# Patient Record
Sex: Male | Born: 1945
Health system: Southern US, Community
[De-identification: ages and names within clinical notes are randomized; demographics above are authoritative.]

## PROBLEM LIST (undated history)

## (undated) DIAGNOSIS — I5020 Unspecified systolic (congestive) heart failure: Secondary | ICD-10-CM

## (undated) DIAGNOSIS — Z720 Tobacco use: Secondary | ICD-10-CM

## (undated) DIAGNOSIS — I255 Ischemic cardiomyopathy: Secondary | ICD-10-CM

## (undated) DIAGNOSIS — I2510 Atherosclerotic heart disease of native coronary artery without angina pectoris: Secondary | ICD-10-CM

## (undated) HISTORY — DX: Atherosclerotic heart disease of native coronary artery without angina pectoris: I25.10

## (undated) HISTORY — PX: CORONARY ANGIOPLASTY WITH STENT PLACEMENT: SHX49

## (undated) HISTORY — DX: Tobacco use: Z72.0

## (undated) HISTORY — DX: Unspecified systolic (congestive) heart failure: I50.20

## (undated) HISTORY — DX: Ischemic cardiomyopathy: I25.5

---

## 2018-07-16 ENCOUNTER — Inpatient Hospital Stay
Admission: EM | Admit: 2018-07-16 | Discharge: 2018-07-17 | DRG: 270 | Disposition: A | Discharge status: Other Acute Inpatient Hospital | Payer: Medicare HMO | Attending: Internal Medicine | Admitting: Internal Medicine

## 2018-07-16 ENCOUNTER — Encounter
Admission: EM | Disposition: A | Discharge status: Other Acute Inpatient Hospital | Payer: Self-pay | Source: Home / Self Care | Attending: Internal Medicine

## 2018-07-16 ENCOUNTER — Encounter: Payer: Self-pay | Admitting: Emergency Medicine

## 2018-07-16 ENCOUNTER — Other Ambulatory Visit: Payer: Self-pay

## 2018-07-16 DIAGNOSIS — Z955 Presence of coronary angioplasty implant and graft: Secondary | ICD-10-CM

## 2018-07-16 DIAGNOSIS — I5021 Acute systolic (congestive) heart failure: Secondary | ICD-10-CM | POA: Diagnosis present

## 2018-07-16 DIAGNOSIS — I2102 ST elevation (STEMI) myocardial infarction involving left anterior descending coronary artery: Secondary | ICD-10-CM | POA: Diagnosis not present

## 2018-07-16 DIAGNOSIS — Z7982 Long term (current) use of aspirin: Secondary | ICD-10-CM

## 2018-07-16 DIAGNOSIS — I213 ST elevation (STEMI) myocardial infarction of unspecified site: Secondary | ICD-10-CM | POA: Diagnosis present

## 2018-07-16 DIAGNOSIS — Z720 Tobacco use: Secondary | ICD-10-CM | POA: Diagnosis not present

## 2018-07-16 DIAGNOSIS — I2510 Atherosclerotic heart disease of native coronary artery without angina pectoris: Secondary | ICD-10-CM

## 2018-07-16 DIAGNOSIS — I429 Cardiomyopathy, unspecified: Secondary | ICD-10-CM | POA: Diagnosis present

## 2018-07-16 DIAGNOSIS — I252 Old myocardial infarction: Secondary | ICD-10-CM

## 2018-07-16 HISTORY — PX: LEFT HEART CATH AND CORONARY ANGIOGRAPHY: CATH118249

## 2018-07-16 HISTORY — PX: IABP INSERTION: CATH118242

## 2018-07-16 HISTORY — PX: CORONARY/GRAFT ACUTE MI REVASCULARIZATION: CATH118305

## 2018-07-16 LAB — BASIC METABOLIC PANEL
Anion gap: 10 (ref 5–15)
BUN: 16 mg/dL (ref 8–23)
CO2: 26 mmol/L (ref 22–32)
Calcium: 8.8 mg/dL — ABNORMAL LOW (ref 8.9–10.3)
Chloride: 102 mmol/L (ref 98–111)
Creatinine, Ser: 1.35 mg/dL — ABNORMAL HIGH (ref 0.61–1.24)
GFR calc Af Amer: 60 mL/min — ABNORMAL LOW (ref >60)
GFR calc non Af Amer: 52 mL/min — ABNORMAL LOW (ref >60)
GLUCOSE: 109 mg/dL — AB (ref 70–99)
Potassium: 3.9 mmol/L (ref 3.5–5.1)
Sodium: 138 mmol/L (ref 135–145)

## 2018-07-16 LAB — CBC
HCT: 53.1 % — ABNORMAL HIGH (ref 39.0–52.0)
Hemoglobin: 17.6 g/dL — ABNORMAL HIGH (ref 13.0–17.0)
MCH: 30.3 pg (ref 26.0–34.0)
MCHC: 33.1 g/dL (ref 30.0–36.0)
MCV: 91.4 fL (ref 80.0–100.0)
Platelets: 247 10*3/uL (ref 150–400)
RBC: 5.81 MIL/uL (ref 4.22–5.81)
RDW: 14.4 % (ref 11.5–15.5)
WBC: 13.5 10*3/uL — ABNORMAL HIGH (ref 4.0–10.5)
nRBC: 0 % (ref 0.0–0.2)

## 2018-07-16 LAB — PROTIME-INR
INR: 0.9 (ref 0.8–1.2)
Prothrombin Time: 12.5 seconds (ref 11.4–15.2)

## 2018-07-16 LAB — APTT: aPTT: 35 seconds (ref 24–36)

## 2018-07-16 LAB — TROPONIN I: Troponin I: 3.08 ng/mL (ref <0.03)

## 2018-07-16 SURGERY — CORONARY/GRAFT ACUTE MI REVASCULARIZATION
Anesthesia: Moderate Sedation

## 2018-07-16 MED ORDER — FENTANYL CITRATE (PF) 100 MCG/2ML IJ SOLN
INTRAMUSCULAR | Status: AC
  Filled 2018-07-16: qty 2

## 2018-07-16 MED ORDER — NITROGLYCERIN 5 MG/ML IV SOLN
INTRAVENOUS | Status: AC
  Filled 2018-07-16: qty 10

## 2018-07-16 MED ORDER — MIDAZOLAM HCL 2 MG/2ML IJ SOLN
INTRAMUSCULAR | Status: DC | PRN
  Administered 2018-07-16 (×2): 0.5 mg via INTRAVENOUS

## 2018-07-16 MED ORDER — MIDAZOLAM HCL 2 MG/2ML IJ SOLN
INTRAMUSCULAR | Status: AC
  Filled 2018-07-16: qty 2

## 2018-07-16 MED ORDER — HEPARIN SODIUM (PORCINE) 1000 UNIT/ML IJ SOLN
INTRAMUSCULAR | Status: DC | PRN
  Administered 2018-07-16: 6000 [IU] via INTRAVENOUS

## 2018-07-16 MED ORDER — TICAGRELOR 90 MG PO TABS
ORAL_TABLET | ORAL | Status: AC
  Filled 2018-07-16: qty 2

## 2018-07-16 MED ORDER — FUROSEMIDE 10 MG/ML IJ SOLN
INTRAMUSCULAR | Status: DC | PRN
  Administered 2018-07-16 – 2018-07-17 (×2): 20 mg via INTRAVENOUS

## 2018-07-16 MED ORDER — FENTANYL CITRATE (PF) 100 MCG/2ML IJ SOLN
INTRAMUSCULAR | Status: DC | PRN
  Administered 2018-07-16: 12.5 ug via INTRAVENOUS

## 2018-07-16 MED ORDER — TIROFIBAN HCL IN NACL 5-0.9 MG/100ML-% IV SOLN
INTRAVENOUS | Status: DC | PRN
  Administered 2018-07-16: 0.15 ug/kg/min via INTRAVENOUS

## 2018-07-16 MED ORDER — HEPARIN SODIUM (PORCINE) 5000 UNIT/ML IJ SOLN
4000.0000 [IU] | Freq: Once | INTRAMUSCULAR | Status: AC
  Administered 2018-07-16: 4000 [IU] via INTRAVENOUS

## 2018-07-16 MED ORDER — HEPARIN SODIUM (PORCINE) 1000 UNIT/ML IJ SOLN
INTRAMUSCULAR | Status: AC
  Filled 2018-07-16: qty 1

## 2018-07-16 MED ORDER — NITROGLYCERIN 0.4 MG SL SUBL
0.4000 mg | SUBLINGUAL_TABLET | SUBLINGUAL | Status: AC
  Administered 2018-07-16: 0.4 mg via SUBLINGUAL

## 2018-07-16 MED ORDER — SODIUM CHLORIDE 0.9 % IV SOLN
INTRAVENOUS | Status: AC | PRN
  Administered 2018-07-16: 250 mL/h via INTRAVENOUS

## 2018-07-16 MED ORDER — ADENOSINE 6 MG/2ML IV SOLN
INTRAVENOUS | Status: AC
  Filled 2018-07-16: qty 2

## 2018-07-16 MED ORDER — TICAGRELOR 90 MG PO TABS
ORAL_TABLET | ORAL | Status: DC | PRN
  Administered 2018-07-16: 180 mg via ORAL

## 2018-07-16 MED ORDER — VERAPAMIL HCL 2.5 MG/ML IV SOLN
INTRAVENOUS | Status: DC | PRN
  Administered 2018-07-16: 2.5 mg via INTRA_ARTERIAL

## 2018-07-16 MED ORDER — ADENOSINE (DIAGNOSTIC) FOR INTRACORONARY USE
INTRAVENOUS | Status: DC | PRN
  Administered 2018-07-16: 30 ug via INTRACORONARY
  Administered 2018-07-16: 60 ug via INTRACORONARY
  Administered 2018-07-16: 30 ug via INTRACORONARY
  Administered 2018-07-16 (×2): 60 ug via INTRACORONARY

## 2018-07-16 MED ORDER — NITROGLYCERIN 1 MG/10 ML FOR IR/CATH LAB
INTRA_ARTERIAL | Status: DC | PRN
  Administered 2018-07-16: 100 ug via INTRACORONARY

## 2018-07-16 MED ORDER — TIROFIBAN (AGGRASTAT) BOLUS VIA INFUSION
INTRAVENOUS | Status: DC | PRN
  Administered 2018-07-16: 2040 ug via INTRAVENOUS

## 2018-07-16 MED ORDER — HEPARIN (PORCINE) 25000 UT/250ML-% IV SOLN
1000.0000 [IU]/h | INTRAVENOUS | Status: DC
  Filled 2018-07-16: qty 250

## 2018-07-16 MED ORDER — FUROSEMIDE 10 MG/ML IJ SOLN
INTRAMUSCULAR | Status: AC
  Filled 2018-07-16: qty 4

## 2018-07-16 MED ORDER — VERAPAMIL HCL 2.5 MG/ML IV SOLN
INTRAVENOUS | Status: AC
  Filled 2018-07-16: qty 2

## 2018-07-16 MED ORDER — HEPARIN (PORCINE) IN NACL 1000-0.9 UT/500ML-% IV SOLN
INTRAVENOUS | Status: DC | PRN
  Administered 2018-07-16: 1000 mL

## 2018-07-16 SURGICAL SUPPLY — 26 items
BALLN IABP SENSA PLUS 8F 50CC (BALLOONS) ×3
BALLN TREK RX 2.5X12 (BALLOONS) ×3
BALLN ~~LOC~~ EUPHORA RX 3.25X15 (BALLOONS) ×3
BALLOON IABP SENS PLUS 8F 50CC (BALLOONS) IMPLANT
BALLOON TREK RX 2.5X12 (BALLOONS) IMPLANT
BALLOON ~~LOC~~ EUPHORA RX 3.25X15 (BALLOONS) IMPLANT
CANNULA 5F STIFF (CANNULA) ×2 IMPLANT
CATH EAGLE EYE PLAT IMAGING (CATHETERS) ×2 IMPLANT
CATH INFINITI JR4 5F (CATHETERS) ×2 IMPLANT
CATH LAUNCHER 6FR EBU 3 (CATHETERS) ×2 IMPLANT
CATH VISTA GUIDE 6FR JR4 (CATHETERS) ×2 IMPLANT
DEVICE INFLAT 30 PLUS (MISCELLANEOUS) ×2 IMPLANT
DEVICE RAD TR BAND REGULAR (VASCULAR PRODUCTS) ×2 IMPLANT
GLIDESHEATH SLEND SS 6F .021 (SHEATH) ×2 IMPLANT
GUIDEWIRE EMER 3M J .025X150CM (WIRE) ×2 IMPLANT
KIT MANI 3VAL PERCEP (MISCELLANEOUS) ×3 IMPLANT
KIT TRANSPAC II SGL 4260605 (MISCELLANEOUS) ×2 IMPLANT
PACK CARDIAC CATH (CUSTOM PROCEDURE TRAY) ×3 IMPLANT
PROTECTION STATION PRESSURIZED (MISCELLANEOUS) ×3
SHEATH AVANTI 5FR X 11CM (SHEATH) ×2 IMPLANT
STATION PROTECTION PRESSURIZED (MISCELLANEOUS) IMPLANT
STENT SYNERGY DES 2.75X16 (Permanent Stent) ×2 IMPLANT
WIRE ASAHI PROWATER 180CM (WIRE) ×2 IMPLANT
WIRE GUIDERIGHT .035X150 (WIRE) ×2 IMPLANT
WIRE ROSEN-J .035X260CM (WIRE) ×2 IMPLANT
WIRE RUNTHROUGH .014X180CM (WIRE) ×2 IMPLANT

## 2018-07-16 NOTE — ED Triage Notes (Addendum)
Patient to ER for c/o chest pain today. Took Nitro x3 doses at home [phone] was first dose) with no relief. Patient has h/o MI and states this pain feels similar to previous episodes. Patient reports chest pain is midsternal. Denies any shortness of breath or nausea. Reports mild episode of diaphoresis prior to arrival, which patient states is "how he knows he is having a heart attack". Patient states last MI was 4 years ago, has had 4 stents placed total.  Took 3 full dose ASA's at home.

## 2018-07-16 NOTE — ED Notes (Signed)
Cardiologist at bedside with patient and patient's wife.

## 2018-07-16 NOTE — Progress Notes (Signed)
ANTICOAGULATION CONSULT NOTE - Initial Consult  Pharmacy Consult for heparin drip Indication: chest pain/ACS  No Known Allergies  Patient Measurements: Height: 5\' 11"  (180.3 cm) Weight: 180 lb (81.6 kg) IBW/kg (Calculated) : 75.3 Heparin Dosing Weight: 80 kg  Vital Signs: Temp: 99.1 F (37.3 C) (02/25 2203) Temp Source: Oral (02/25 2203) BP: 122/83 (02/25 2230) Pulse Rate: 94 (02/25 2230)  Labs: Recent Labs    07/16/18 2201  HGB 17.6*  HCT 53.1*  PLT 247  APTT 35  LABPROT 12.5  INR 0.9  CREATININE 1.35*  TROPONINI 3.08*    Estimated Creatinine Clearance: 51.9 mL/min (A) (by C-G formula based on SCr of 1.35 mg/dL (H)).   Medical History: Past Medical History:  Diagnosis Date  . MI (myocardial infarction) ([place])     Medications:  Scheduled:    Assessment: Patient arrived s/t to CP called a code STEMI, trops 3.08, EKG showing ST elevation in leads VI, II, & III. No record of PTA anticoagulation. Patient being started on heparin drip for STEMI--patient currently in cath, received heparin bolus, but heparin drip not yet started as of 2300.  Goal of Therapy:  Heparin level 0.3-0.7 units/ml Monitor platelets by anticoagulation protocol: Yes   Plan:  Patient was bolused w/ heparin 4000 units IV x 1 Ordered rate to start at 1000 units/hr Baseline labs ordered and WNL Will check anti-Xa @ 0800 unless heparin drip no longer needed post-cath Will monitor daily CBC's and adjust per anti-Xa levels   Tobie Lords, PharmD, BCPS Clinical Pharmacist 07/16/2018

## 2018-07-16 NOTE — ED Provider Notes (Signed)
Called for CODE STEMI activation at 956p.  Charge RN Larene Beach aware. Main side bed being assigned.  EKG reviewed (screened) at 954p, positive for STEMI changes.    Delman Kitten, MD 07/16/18 2157

## 2018-07-16 NOTE — Consult Note (Signed)
Cardiology Consultation:   Patient ID: Nicholas Schultz MRN: [id number]; DOB: 1945/05/30  Admit date: 07/16/2018 Date of Consult: 07/16/2018  Primary Care Provider: Patient, [name] Per Primary Cardiologist: New - Nicholas Schultz Primary Electrophysiologist:  None    Patient Profile:   Nicholas Schultz is a 73 y.o. male with a hx of CAD s/p "three heart attacks and 4 uncoated stents," who is being seen today for the evaluation of chest pain and abnormal EKG at the request of Dr. Jacqualine Code.  History of Present Illness:   Nicholas Schultz reports having 3 prior heart attacks but has not followed with a physician for many years.  He only takes aspirin and states that he will not take more than 3 months of dual antiplatelet therapy due to bruising while on aspirin and clopidogrel in the past.  He was in his usual state of health until approximately 7 PM, when he had acute onset of substernal chest pain without radiation.  He denies accompanying symptoms including shortness of breath, nausea, and diaphoresis.  He presented to the emergency department, where he was found to have anterior ST segment elevation.  Code STEMI was called.  Upon my arrival, pain was minimal.  He was transported to the cardiac cath lab, where he was found to have a thrombotic 99% stenosis in the proximal LAD just before a remotely placed stent.  Lesion was successfully treated with a single drug-eluting stent overlapping the old stent.  Patient was noted to have severely reduced LVEF and severely elevated LVEDP.  Therefore, intra-aortic balloon pump was placed in the setting of acute MI complicated by severe acute heart failure.  Past Medical History:  Diagnosis Date  . MI (myocardial infarction) [facility])     Past Surgical History:  Procedure Laterality Date  . CORONARY ANGIOPLASTY WITH STENT PLACEMENT       Home Medications:  Aspirin 81 mg daily  Inpatient Medications: Scheduled Meds:  Continuous Infusions: . heparin     PRN  Meds:   Allergies:   No Known Allergies  Social History:   Social History   Tobacco Use  . Smoking status: Current Every Day Smoker    Types: Cigars  . Smokeless tobacco: Never Used  Substance Use Topics  . Alcohol use: Yes    Comment: rare  . Drug use: Not on file    Family History:   Father has a history of multiple MIs.  ROS:  Review of Systems  Unable to perform ROS: Acuity of condition   Physical Exam/Data:   Vitals:   07/16/18 2203 07/16/18 2210 07/16/18 2220 07/16/18 2230  BP: 123/89   122/83  Pulse: 98 96 91 94  Resp: (!) 24 (!) 22 12 (!) 26  Temp: 99.1 F (37.3 C)     TempSrc: Oral     SpO2: 100% 95% 95% 96%  Weight:      Height:       No intake or output data in the 24 hours ending 07/16/18 2244 Last 3 Weights 07/16/2018  Weight (lbs) 180 lb  Weight (kg) 81.647 kg     Body mass index is 25.1 kg/m.  General: Elderly man, lying in bed.  He is in no acute distress. HEENT: normal Lymph: no adenopathy Neck: Unable to assess, as the patient is lying supine on the cath table. Endocrine:  No thryomegaly Vascular: No carotid bruits; 2+ radial, femoral, and pedal pulses bilaterally.  TR band overlies the right wrist.  Right femoral intra-aortic balloon pump is in  place without hematoma or bleeding. Cardiac: Regular rate and rhythm without murmurs, rubs, or gallops. Lungs: Coarse breath sounds anteriorly.  No wheezes or crackles. Abd: Soft, nontender, no hepatomegaly  Ext: No significant lower extremity edema. Musculoskeletal:  No deformities, BUE and BLE strength normal and equal Skin: warm and dry  Neuro:  CNs 2-12 intact, no focal abnormalities noted Psych:  Normal affect   EKG:  The EKG was personally reviewed and demonstrates: Normal sinus rhythm with anterior ST segment elevation.  Relevant CV Studies: LHC/PCI (07/16/2018): Multivessel CAD with moderate LCx and RCA disease.  Severe ostial RPDA stenosis is noted.  Culprit lesion is a thrombotic 99%  stenosis in the proximal LAD.  IVIS demonstrates grossly undersized stents throughout the proximal and mid LAD and diffuse plaquing of the LAD.  Successful PCI to the proximal LAD using a Synergy 2.75 x 16 mm drug-eluting stent (postdilated to 3.4 mm) with 0% residual stenosis and TIMI-3 flow.  LVEF severely reduced with anterior and apical akinesis.  LVEF approximately 25%.  LVEDP approximately 40 mmHg.  Laboratory Data:  ChemistryNo results for input(s): NA, K, CL, CO2, GLUCOSE, BUN, CREATININE, CALCIUM, GFRNONAA, GFRAA, ANIONGAP in the last 168 hours.  No results for input(s): PROT, ALBUMIN, AST, ALT, ALKPHOS, BILITOT in the last 168 hours. Hematology Recent Labs  Lab 07/16/18 2201  WBC 13.5*  RBC 5.81  HGB 17.6*  HCT 53.1*  MCV 91.4  MCH 30.3  MCHC 33.1  RDW 14.4  PLT 247   Cardiac EnzymesNo results for input(s): TROPONINI in the last 168 hours. No results for input(s): TROPIPOC in the last 168 hours.  BNPNo results for input(s): BNP, PROBNP in the last 168 hours.  DDimer No results for input(s): DDIMER in the last 168 hours.  Radiology/Studies:  No results found.  Assessment and Plan:   Anterior STEMI Presentation consistent with acute plaque rupture in the proximal LAD just before a remotely placed stent.  IVUS demonstrates extensive plaquing of the LAD with undersizing of prior stents.  Patient was successfully treated with a single drug-eluting stent in the proximal LAD overlapping previously placed stent.  He is currently chest pain-free.  Given severely reduced LVEF and elevated LVEDP, IABP was placed for hemodynamic support.  Transferred to [facility] for post STEMI care, including IABP management as appropriate nursing is not available at [facility] for maintenance of IABP.  DAPT with aspirin and ticagrelor for at least 12 months.  Aggressive secondary prevention including high intensity statin therapy.  Acute systolic heart failure LVEF severely reduced with significant  anterior wall motion abnormality and severely elevated LVEDP.  Given history of prior MIs and stents in the LAD, some degree of cardiomyopathy may be chronic.  We do not have records from outside hospital detailing prior cardiac care/interventions.  IABP care 1:1 and wean as tolerated.  Obtain portable chest radiograph when patient arrives at [facility].  Continue diuresis.  Consider adding beta-blocker and ACE inhibitor/ARB once decompensated heart failure improves and blood pressure/renal function tolerate.  Obtain transthoracic echocardiogram.  Tobacco use Smoking cessation encouraged.  Disposition Due to critical illness status post anterior STEMI with severe acute systolic heart failure status post intra-aortic balloon pump, we will transfer the patient to [facility] for ongoing management.  Nursing staff is not available at [facility] to manage IABP.  I have discussed risks and benefits of transport with the patient and his wife; they are in agreement with this.  I have also discussed the patient's care with  Dr. Burt Knack, who is the interventional attending at [facility].  For questions or updates, please contact [place] Please consult [email or web address] for contact info under [facility] Cardiology.  Signed, Nelva Bush, MD  07/16/2018 10:44 PM

## 2018-07-16 NOTE — ED Provider Notes (Signed)
[facility] Emergency Department Provider Note   ____________________________________________   First MD Initiated Contact with Patient 07/16/18 2204     (approximate)  I have reviewed the triage vital signs and the nursing notes.   HISTORY  Chief Complaint Chest Pain  EM caveat: Limited due to acute cardiovascular concerns and life-threatening cardiovascular illness suspected  HPI Nicholas Schultz is a 73 y.o. male here for evaluation of chest pain   Rather abrupt heavy pressure in the chest since 730 this evening.  Started suddenly.  Took 3 325 mg aspirin tablets, 3 nitroglycerin tablets and reports the pain is eased down to about moderate.  No nausea or vomiting.  No chills.  No recent illness.  No chest pain that is sharp.  No ripping tearing or moving pain.  Reports same in the past with 3 previous heart attacks and 4 stents  Past Medical History:  Diagnosis Date  . MI (myocardial infarction) [facility])     Patient Active Problem List   Diagnosis Date Noted  . STEMI involving left anterior descending coronary artery ([place]) 07/17/2018  . Acute ST elevation myocardial infarction (STEMI) ([place]) 07/17/2018    Past Surgical History:  Procedure Laterality Date  . CORONARY ANGIOPLASTY WITH STENT PLACEMENT    . CORONARY/GRAFT ACUTE MI REVASCULARIZATION N/A 07/16/2018   Procedure: Coronary/Graft Acute MI Revascularization;  Surgeon: Nelva Bush, MD;  Location: [place] CV LAB;  Service: Cardiovascular;  Laterality: N/A;  . IABP INSERTION N/A 07/16/2018   Procedure: IABP Insertion;  Surgeon: Nelva Bush, MD;  Location: [place] CV LAB;  Service: Cardiovascular;  Laterality: N/A;  . LEFT HEART CATH AND CORONARY ANGIOGRAPHY N/A 07/16/2018   Procedure: LEFT HEART CATH AND CORONARY ANGIOGRAPHY;  Surgeon: Nelva Bush, MD;  Location: [place] CV LAB;  Service: Cardiovascular;  Laterality: N/A;    Prior to Admission medications   Medication  Sig Start Date End Date Taking? Authorizing Provider  aspirin EC 81 MG tablet Take 81 mg by mouth daily.    [provider]  flavoxATE (URISPAS) 100 MG tablet Take 100 mg by mouth as needed (urinary frequency).    [provider]    Allergies Patient has no known allergies.  No family history on file.  Social History Social History   Tobacco Use  . Smoking status: Current Every Day Smoker    Types: Cigars  . Smokeless tobacco: Never Used  Substance Use Topics  . Alcohol use: Yes    Comment: rare  . Drug use: Not on file    Review of Systems EM caveat No numbness or tingling.  No pain in the back.  Denies any problems with easy bleeding.    ____________________________________________   PHYSICAL EXAM:  VITAL SIGNS: ED Triage Vitals  Enc Vitals Group     BP 07/16/18 2203 123/89     Pulse Rate 07/16/18 2203 98     Resp 07/16/18 2203 (!) 24     Temp 07/16/18 2203 99.1 F (37.3 C)     Temp Source 07/16/18 2203 Oral     SpO2 07/16/18 2203 100 %     Weight 07/16/18 2153 180 lb (81.6 kg)     Height 07/16/18 2153 5\' 11"  (1.803 m)     Head Circumference --      Peak Flow --      Pain Score 07/16/18 2153 2     Pain Loc --      Pain Edu? --  Excl. in [place]? --     Constitutional: Alert and oriented.  Ill-appearing slightly pale. Eyes: Conjunctivae are normal. Head: Atraumatic. Nose: No congestion/rhinnorhea. Mouth/Throat: Mucous membranes are moist. Neck: No stridor.  Cardiovascular: Normal rate, regular rhythm. Grossly normal heart sounds.  Good peripheral circulation. Respiratory: Normal respiratory effort.  No retractions. Lungs CTAB. Gastrointestinal: Soft and nontender. No distention. Musculoskeletal: No lower extremity tenderness nor edema. Neurologic:  Normal speech and language. No gross focal neurologic deficits are appreciated.  Skin:  Skin is warm, dry and intact. No rash noted. Psychiatric: Mood and affect are slightly anxious.  Speech and behavior are normal.  ____________________________________________   LABS (all labs ordered are listed, but only abnormal results are displayed)  Labs Reviewed  CBC - Abnormal; Notable for the following components:      Result Value   WBC 13.5 (*)    Hemoglobin 17.6 (*)    HCT 53.1 (*)    All other components within normal limits  BASIC METABOLIC PANEL - Abnormal; Notable for the following components:   Glucose, Bld 109 (*)    Creatinine, Ser 1.35 (*)    Calcium 8.8 (*)    GFR calc non Af Amer 52 (*)    GFR calc Af Amer 60 (*)    All other components within normal limits  TROPONIN I - Abnormal; Notable for the following components:   Troponin I 3.08 (*)    All other components within normal limits  PROTIME-INR  APTT  POCT ACTIVATED CLOTTING TIME  POCT ACTIVATED CLOTTING TIME  POCT ACTIVATED CLOTTING TIME   ____________________________________________  EKG  Reviewed and entered by me at 2155 Heart rate 100 QRS 110 QTc 440 Sinus tachycardia.  Acute ST elevation MI and noted in V2 and V3.  Code STEMI initiated ____________________________________________  RADIOLOGY  The patient denies any sharp pain in the chest.  Reports a heavy pressure.  There is no moving pain ripping or tearing component. ____________________________________________   PROCEDURES  Procedure(s) performed: None  Procedures  Critical Care performed: Yes, see critical care note(s)  CRITICAL CARE Performed by: Delman Kitten   Total critical care time: 35 minutes  Critical care time was exclusive of separately billable procedures and treating other patients.  Critical care was necessary to treat or prevent imminent or life-threatening deterioration.  Critical care was time spent personally by me on the following activities: development of treatment plan with patient and/or surrogate as well as nursing, discussions with consultants, evaluation of patient's response to treatment,  examination of patient, obtaining history from patient or surrogate, ordering and performing treatments and interventions, ordering and review of laboratory studies, ordering and review of radiographic studies, pulse oximetry and re-evaluation of patient's condition.  ____________________________________________   INITIAL IMPRESSION / ASSESSMENT AND PLAN / ED COURSE  Pertinent labs & imaging results that were available during my care of the patient were reviewed by me and considered in my medical decision making (see chart for details).   Appears initially significant for concern for ST elevation MI.  Relieved somewhat at home by multiple aspirin tablets and nitro.  Blood pressure is normotensive will give additional nitroglycerin.  Code STEMI has been initiated.  Heparin given.  Patient is resting reports his pain is almost minimal after nitro.  No ripping tearing or moving pain.  No back pain.  History of ACS in the past.  Clinical Course as of Jul 19 1111  Tue Jul 16, 2018  2207 Dr. Saunders Revel en route for STEMI. Called.  Advises 4000 units heparin bolus. Pain currently "almost gone" and is getting better. Normal vital at present. Await cath lab arrival. He took 3x 325mg  asa and 3 nitro tabs since 730p   [MQ]    Clinical Course User Index [MQ] Delman Kitten, MD   Patient taken to the Cath Lab.  Patient went with Dr. end of cardiology Patient given heparin bolus as advised by cardiology.  Has already taken aspirin at home.  After 1 additional nitro here pain was almost gone. ____________________________________________   FINAL CLINICAL IMPRESSION(S) / ED DIAGNOSES  Final diagnoses:  ST elevation myocardial infarction (STEMI), unspecified artery ([place])        Note:  This document was prepared using Dragon voice recognition software and may include unintentional dictation errors       Delman Kitten, MD 07/18/18 1113

## 2018-07-17 ENCOUNTER — Inpatient Hospital Stay (HOSPITAL_COMMUNITY)
Admission: EM | Admit: 2018-07-17 | Discharge: 2018-07-19 | DRG: 270 | Disposition: A | Discharge status: Home / Self Care | Payer: Medicare HMO | Source: Other Acute Inpatient Hospital | Attending: Cardiovascular Disease | Admitting: Cardiovascular Disease

## 2018-07-17 ENCOUNTER — Encounter: Payer: Self-pay | Admitting: Internal Medicine

## 2018-07-17 ENCOUNTER — Inpatient Hospital Stay (HOSPITAL_COMMUNITY): Payer: Medicare HMO

## 2018-07-17 DIAGNOSIS — Z720 Tobacco use: Secondary | ICD-10-CM | POA: Diagnosis not present

## 2018-07-17 DIAGNOSIS — I2102 ST elevation (STEMI) myocardial infarction involving left anterior descending coronary artery: Principal | ICD-10-CM | POA: Diagnosis present

## 2018-07-17 DIAGNOSIS — I252 Old myocardial infarction: Secondary | ICD-10-CM | POA: Diagnosis not present

## 2018-07-17 DIAGNOSIS — Z7982 Long term (current) use of aspirin: Secondary | ICD-10-CM | POA: Diagnosis not present

## 2018-07-17 DIAGNOSIS — F17290 Nicotine dependence, other tobacco product, uncomplicated: Secondary | ICD-10-CM | POA: Diagnosis present

## 2018-07-17 DIAGNOSIS — I213 ST elevation (STEMI) myocardial infarction of unspecified site: Secondary | ICD-10-CM | POA: Diagnosis present

## 2018-07-17 DIAGNOSIS — Z8249 Family history of ischemic heart disease and other diseases of the circulatory system: Secondary | ICD-10-CM | POA: Diagnosis not present

## 2018-07-17 DIAGNOSIS — E785 Hyperlipidemia, unspecified: Secondary | ICD-10-CM | POA: Diagnosis present

## 2018-07-17 DIAGNOSIS — I2109 ST elevation (STEMI) myocardial infarction involving other coronary artery of anterior wall: Secondary | ICD-10-CM

## 2018-07-17 DIAGNOSIS — I255 Ischemic cardiomyopathy: Secondary | ICD-10-CM | POA: Diagnosis present

## 2018-07-17 DIAGNOSIS — I2510 Atherosclerotic heart disease of native coronary artery without angina pectoris: Secondary | ICD-10-CM | POA: Diagnosis present

## 2018-07-17 DIAGNOSIS — Z79899 Other long term (current) drug therapy: Secondary | ICD-10-CM | POA: Diagnosis not present

## 2018-07-17 DIAGNOSIS — D45 Polycythemia vera: Secondary | ICD-10-CM | POA: Diagnosis present

## 2018-07-17 DIAGNOSIS — Z955 Presence of coronary angioplasty implant and graft: Secondary | ICD-10-CM | POA: Diagnosis not present

## 2018-07-17 DIAGNOSIS — Z9861 Coronary angioplasty status: Secondary | ICD-10-CM

## 2018-07-17 DIAGNOSIS — I5021 Acute systolic (congestive) heart failure: Secondary | ICD-10-CM | POA: Diagnosis present

## 2018-07-17 DIAGNOSIS — E782 Mixed hyperlipidemia: Secondary | ICD-10-CM | POA: Diagnosis not present

## 2018-07-17 DIAGNOSIS — I429 Cardiomyopathy, unspecified: Secondary | ICD-10-CM | POA: Diagnosis present

## 2018-07-17 LAB — HEMOGLOBIN A1C
Hgb A1c MFr Bld: 5.7 % — ABNORMAL HIGH (ref 4.8–5.6)
Mean Plasma Glucose: 116.89 mg/dL

## 2018-07-17 LAB — POCT ACTIVATED CLOTTING TIME
Activated Clotting Time: 246 seconds
Activated Clotting Time: 274 seconds
Activated Clotting Time: 279 seconds

## 2018-07-17 LAB — BASIC METABOLIC PANEL
ANION GAP: 15 (ref 5–15)
BUN: 12 mg/dL (ref 8–23)
CO2: 21 mmol/L — ABNORMAL LOW (ref 22–32)
Calcium: 8.8 mg/dL — ABNORMAL LOW (ref 8.9–10.3)
Chloride: 101 mmol/L (ref 98–111)
Creatinine, Ser: 1.41 mg/dL — ABNORMAL HIGH (ref 0.61–1.24)
GFR calc Af Amer: 57 mL/min — ABNORMAL LOW (ref >60)
GFR calc non Af Amer: 49 mL/min — ABNORMAL LOW (ref >60)
Glucose, Bld: 125 mg/dL — ABNORMAL HIGH (ref 70–99)
Potassium: 4 mmol/L (ref 3.5–5.1)
Sodium: 137 mmol/L (ref 135–145)

## 2018-07-17 LAB — BRAIN NATRIURETIC PEPTIDE: B Natriuretic Peptide: 1275.2 pg/mL — ABNORMAL HIGH (ref 0.0–100.0)

## 2018-07-17 LAB — MAGNESIUM: Magnesium: 2 mg/dL (ref 1.7–2.4)

## 2018-07-17 LAB — TROPONIN I
Troponin I: >65 ng/mL (ref <0.03)
Troponin I: 46.77 ng/mL (ref <0.03)

## 2018-07-17 LAB — LIPID PANEL
Cholesterol: 269 mg/dL — ABNORMAL HIGH (ref 0–200)
HDL: 40 mg/dL — ABNORMAL LOW (ref >40)
LDL CALC: 211 mg/dL — AB (ref 0–99)
Total CHOL/HDL Ratio: 6.7 RATIO
Triglycerides: 88 mg/dL (ref <150)
VLDL: 18 mg/dL (ref 0–40)

## 2018-07-17 LAB — HEPARIN LEVEL (UNFRACTIONATED)
Heparin Unfractionated: 0.17 IU/mL — ABNORMAL LOW (ref 0.30–0.70)
Heparin Unfractionated: 0.15 IU/mL — ABNORMAL LOW (ref 0.30–0.70)

## 2018-07-17 LAB — CBC
HCT: 54.3 % — ABNORMAL HIGH (ref 39.0–52.0)
Hemoglobin: 18.1 g/dL — ABNORMAL HIGH (ref 13.0–17.0)
MCH: 29.8 pg (ref 26.0–34.0)
MCHC: 33.3 g/dL (ref 30.0–36.0)
MCV: 89.3 fL (ref 80.0–100.0)
PLATELETS: 256 10*3/uL (ref 150–400)
RBC: 6.08 MIL/uL — ABNORMAL HIGH (ref 4.22–5.81)
RDW: 14.5 % (ref 11.5–15.5)
WBC: 16.3 10*3/uL — ABNORMAL HIGH (ref 4.0–10.5)
nRBC: 0 % (ref 0.0–0.2)

## 2018-07-17 LAB — ECHOCARDIOGRAM COMPLETE: Weight: 2850.11 oz

## 2018-07-17 LAB — MRSA PCR SCREENING: MRSA by PCR: NEGATIVE

## 2018-07-17 MED ORDER — FUROSEMIDE 10 MG/ML IJ SOLN
40.0000 mg | Freq: Once | INTRAMUSCULAR | Status: AC
  Administered 2018-07-17: 40 mg via INTRAVENOUS
  Filled 2018-07-17: qty 4

## 2018-07-17 MED ORDER — FUROSEMIDE 10 MG/ML IJ SOLN: 40.0000 mg | Freq: Once | INTRAMUSCULAR | Status: DC

## 2018-07-17 MED ORDER — PERFLUTREN LIPID MICROSPHERE
1.0000 mL | INTRAVENOUS | Status: AC | PRN
  Administered 2018-07-17: 2 mL via INTRAVENOUS
  Filled 2018-07-17: qty 10

## 2018-07-17 MED ORDER — NITROGLYCERIN 0.4 MG SL SUBL: 0.4000 mg | SUBLINGUAL_TABLET | SUBLINGUAL | Status: DC | PRN

## 2018-07-17 MED ORDER — IOPAMIDOL (ISOVUE-300) INJECTION 61%
INTRAVENOUS | Status: DC | PRN
  Administered 2018-07-17: 160 mL via INTRA_ARTERIAL

## 2018-07-17 MED ORDER — ENOXAPARIN SODIUM 40 MG/0.4ML ~~LOC~~ SOLN
40.0000 mg | SUBCUTANEOUS | Status: DC
  Administered 2018-07-18 – 2018-07-19 (×2): 40 mg via SUBCUTANEOUS
  Filled 2018-07-17 (×2): qty 0.4

## 2018-07-17 MED ORDER — ATROPINE SULFATE 1 MG/10ML IJ SOSY
PREFILLED_SYRINGE | INTRAMUSCULAR | Status: AC
  Filled 2018-07-17: qty 10

## 2018-07-17 MED ORDER — ATORVASTATIN CALCIUM 80 MG PO TABS
80.0000 mg | ORAL_TABLET | Freq: Every day | ORAL | Status: DC
  Administered 2018-07-17 – 2018-07-18 (×3): 80 mg via ORAL
  Filled 2018-07-17 (×3): qty 1

## 2018-07-17 MED ORDER — ACETAMINOPHEN 325 MG PO TABS: 650.0000 mg | ORAL_TABLET | ORAL | Status: DC | PRN

## 2018-07-17 MED ORDER — ASPIRIN 81 MG PO CHEW
81.0000 mg | CHEWABLE_TABLET | Freq: Every day | ORAL | Status: DC
  Administered 2018-07-17 – 2018-07-19 (×3): 81 mg via ORAL
  Filled 2018-07-17 (×3): qty 1

## 2018-07-17 MED ORDER — HEPARIN (PORCINE) 25000 UT/250ML-% IV SOLN
1150.0000 [IU]/h | INTRAVENOUS | Status: DC
  Administered 2018-07-17: 1000 [IU]/h via INTRAVENOUS
  Filled 2018-07-17: qty 250

## 2018-07-17 MED ORDER — TICAGRELOR 90 MG PO TABS
90.0000 mg | ORAL_TABLET | Freq: Two times a day (BID) | ORAL | Status: DC
  Administered 2018-07-17 – 2018-07-19 (×5): 90 mg via ORAL
  Filled 2018-07-17 (×5): qty 1

## 2018-07-17 NOTE — Care Management (Signed)
[employer] benefits check sent and pending.  Taneia Mealor RN, BSN, NCM-BC, ACM-RN [phone] 

## 2018-07-17 NOTE — Progress Notes (Signed)
Site area: Rt fem art sheath removed.  Site Prior to Removal:  Level o Pressure Applied For: 55min Manual:   Yes Patient Status During Pull:  A/O Post Pull Site:  Level 0 Post Pull Instructions Given:  Post instructions given and pt understands Post Pull Pulses Present: 2+ rt dp/pt Dressing Applied:  Tegaderm and 4x4 applied Bedrest begins @ 16:10:00 Comments: Aaron Edelman -RN in to observe rt groin. Level 0. Bp 111/82 and Hr 102. Balloon pump removed without complication. Rt groin unremarkable,no bruising or hematoma,  dressing is CDI.

## 2018-07-17 NOTE — Discharge Summary (Signed)
Discharge Summary    Patient ID: Nicholas Schultz MRN: [id number]; DOB: 24-Apr-1946  Admit date: 07/16/2018 Discharge date: 07/17/2018  Primary Care Provider: Patient, [name] Per  Primary Cardiologist: New - Avielle Imbert Primary Electrophysiologist:  None   Discharge Diagnoses    Principal Problem:   STEMI involving left anterior descending coronary artery ([place])   Allergies No Known Allergies  Diagnostic Studies/Procedures    LHC/PCI (07/16/2018): Multivessel CAD with moderate LCx and RCA disease.  Severe ostial RPDA stenosis is noted.  Culprit lesion is a thrombotic 99% stenosis in the proximal LAD.  IVIS demonstrates grossly undersized stents throughout the proximal and mid LAD and diffuse plaquing of the LAD.  Successful PCI to the proximal LAD using a Synergy 2.75 x 16 mm drug-eluting stent (postdilated to 3.4 mm) with 0% residual stenosis and TIMI-3 flow.  LVEF severely reduced with anterior and apical akinesis.  LVEF approximately 25%.  LVEDP approximately 40 mmHg. _____________   History of Present Illness     [name] reports having 3 prior heart attacks but has not followed with a physician for many years.  He only takes aspirin and states that he will not take more than 3 months of dual antiplatelet therapy due to bruising while on aspirin and clopidogrel in the past.  He was in his usual state of health until approximately 7 PM, when he had acute onset of substernal chest pain without radiation.  He denies accompanying symptoms including shortness of breath, nausea, and diaphoresis.  He presented to the emergency department, where he was found to have anterior ST segment elevation.  Code STEMI was called.  Upon my arrival, pain was minimal.  He was transported to the cardiac cath lab, where he was found to have a thrombotic 99% stenosis in the proximal LAD just before a remotely placed stent.  Lesion was successfully treated with a single drug-eluting stent overlapping the old stent.  Patient  was noted to have severely reduced LVEF and severely elevated LVEDP.  Therefore, intra-aortic balloon pump was placed in the setting of acute MI complicated by severe acute heart failure.  Hospital Course     Consultants: None   See H&P above.  Patient remained stable in the cardiac Cath Lab and see history and physical above.  Patient has remained stable in the cardiac cath lab pending transfer to [facility] via [place].  He has made significant urine following administration of furosemide 40 mg IV during the case. _____________  Discharge Vitals Blood pressure (!) 153/92, pulse 80, temperature 99.1 F (37.3 C), temperature source Oral, resp. rate 14, height 5\' 11"  (1.803 m), weight 81.6 kg, SpO2 94 %.  Filed Weights   07/16/18 2153  Weight: 81.6 kg    Labs & Radiologic Studies    CBC Recent Labs    07/16/18 2201  WBC 13.5*  HGB 17.6*  HCT 53.1*  MCV 91.4  PLT 967   Basic Metabolic Panel Recent Labs    07/16/18 2201  NA 138  K 3.9  CL 102  CO2 26  GLUCOSE 109*  BUN 16  CREATININE 1.35*  CALCIUM 8.8*   Liver Function Tests No results for input(s): AST, ALT, ALKPHOS, BILITOT, PROT, ALBUMIN in the last 72 hours. No results for input(s): LIPASE, AMYLASE in the last 72 hours. Cardiac Enzymes Recent Labs    07/16/18 2201  TROPONINI 3.08*   BNP Invalid input(s): POCBNP D-Dimer No results for input(s): DDIMER in the last 72 hours. Hemoglobin A1C No results  for input(s): HGBA1C in the last 72 hours. Fasting Lipid Panel No results for input(s): CHOL, HDL, LDLCALC, TRIG, CHOLHDL, LDLDIRECT in the last 72 hours. Thyroid Function Tests No results for input(s): TSH, T4TOTAL, T3FREE, THYROIDAB in the last 72 hours.  Invalid input(s): FREET3 _____________  No results found. Disposition   Pt is being transferred to to heart ICU at [facility] for ongoing care.  He is in critical but stable condition.  Follow-up Plans & Appointments  To be  determined.   Discharge Instructions    AMB Referral to Cardiac Rehabilitation - Phase II   Complete by:  As directed    Diagnosis:   Coronary Stents STEMI        Discharge Medications   Allergies as of 07/17/2018   No Known Allergies     Medication List    You have not been prescribed any medications.      Acute coronary syndrome (MI, NSTEMI, STEMI, etc) this admission?: Yes.     AHA/ACC Clinical Performance & Quality Measures: N/A; emergent transfer to acute care hospital for higher level of care.    Outstanding Labs/Studies   None  Duration of Discharge Encounter   Less than 30 minutes including physician time.  Signed, Nelva Bush, MD 07/17/2018, 1:10 AM

## 2018-07-17 NOTE — Progress Notes (Signed)
Provided wife, emotional support during STEMI. No active faith community. Qu[employer] background. Local family as well as family out of town. Wife expressed complex grief to to recent death of father, mother's dementia, another family member's cancer and husband's heart issue. Family moved back to area 2 years ago. Due to transfer, Chaplain aided wife in getting directions to [facility].    07/17/18 0000  Clinical Encounter Type  Visited With Patient and family together  Visit Type Initial;Other (Comment)  Referral From Nurse  Spiritual Encounters  Spiritual Needs Emotional

## 2018-07-17 NOTE — Progress Notes (Signed)
ANTICOAGULATION CONSULT NOTE - Initial Consult  Pharmacy Consult for heparin Indication: IABP  No Known Allergies  Patient Measurements: Heparin Dosing Weight: 81.6kg  Vital Signs: Temp: 98.3 F (36.8 C) (02/26 1202) Temp Source: Oral (02/26 1202) BP: 143/85 (02/26 1100) Pulse Rate: 80 (02/26 1245)  Labs: Recent Labs    07/16/18 2201 07/17/18 0555 07/17/18 1140  HGB 17.6* 18.1*  --   HCT 53.1* 54.3*  --   PLT 247 256  --   APTT 35  --   --   LABPROT 12.5  --   --   INR 0.9  --   --   HEPARINUNFRC  --  0.17* 0.15*  CREATININE 1.35* 1.41*  --   TROPONINI 3.08* >65.00*  --     Estimated Creatinine Clearance: 49.7 mL/min (A) (by C-G formula based on SCr of 1.41 mg/dL (H)).   Medical History: Past Medical History:  Diagnosis Date  . MI (myocardial infarction) ([place])     Medications:  Scheduled:  . aspirin  81 mg Oral Daily  . atorvastatin  80 mg Oral q1800  . ticagrelor  90 mg Oral BID    Assessment: 73yo male presented to [facility] as code STEMI, now s/p cardiac cath where IABP was placed in the setting of acute MI complicated by severe acute heart failure, now transferred to [facility] CICU for higher level of care.  Currently on IV heparin at 1000 units/hr. Repeat HL remains subtherapeutic at 0.15. No s/s of bleeding noted. Hgb elevated.   Goal of Therapy:  Heparin level 0.2-0.5 units/ml Monitor platelets by anticoagulation protocol: Yes   Plan:  Increase IV heparin to 1150 units/hr. No bolus  F/u 8 hr HL Monitor daily HL and s/s of bleeding   Albertina Parr, PharmD., BCPS Clinical Pharmacist Clinical phone for 07/17/18 until 3:30pm: [phone] If after 3:30pm, please refer to [facility] for unit-specific pharmacist

## 2018-07-17 NOTE — H&P (Signed)
History & Physical    Patient ID: Nicholas Schultz MRN: [id number], DOB/AGE: [birth year]   Admit date: 07/17/2018  Primary Physician: Patient, [name] Per Primary Cardiologist: Nicholas Backers, MD  Patient Profile    Nicholas Schultz is a 73 y.o. male with a hx of CAD s/p "three heart attacks and 4 uncoated stents," who presented earlier today to [facility] with an anterior STEMI and is now s/p PCI to LAD culprit and placement of IABP.  History of Present Illness    Nicholas Schultz reports having 3 prior heart attacks but has not followed with a physician for many years.  He only takes aspirin and states that he will not take more than 3 months of dual antiplatelet therapy due to bruising while on aspirin and clopidogrel in the past.  He was in his usual state of health until approximately 7 PM, when he had acute onset of substernal chest pain without radiation.  He denies accompanying symptoms including shortness of breath, nausea, and diaphoresis.  He presented to the emergency department, where he was found to have anterior ST segment elevation.  Code STEMI was called.  Upon cardiology evaluation, pain was minimal.  He was transported to the cardiac cath lab, where he was found to have a thrombotic 99% stenosis in the proximal LAD just before a remotely placed under-expanded stent.  Lesion was successfully treated with a single drug-eluting stent overlapping the prior stent (see full report). Patient was noted to have severely reduced LVEF and severely elevated LVEDP.  Therefore, intra-aortic balloon pump was placed in the setting of acute MI complicated by severe acute heart failure. He was given Lasix 40mg  IV with reportedly good diuretic response and he was transferred to [facility] for further management. He has been loaded with ticagrelor and Aggrastat was discontinued prior to transfer. On arrival here Nicholas Schultz feels tired and anxious to leave the hospital, but denies any chest pain, dyspnea, or orthopnea.     Past Medical History   Past Medical History:  Diagnosis Date  . MI (myocardial infarction) [facility])     Past Surgical History:  Procedure Laterality Date  . CORONARY ANGIOPLASTY WITH STENT PLACEMENT      Allergies No Known Allergies  Home Medications    Prior to Admission medications   Medication Sig Start Date End Date Taking? Authorizing Provider  aspirin EC 81 MG tablet Take 81 mg by mouth daily.   Yes [provider]  flavoxATE (URISPAS) 100 MG tablet Take 100 mg by mouth as needed (urinary frequency).   Yes [provider]    Family History    Father has a history of multiple MIs.  Social History    Current daily cigar smoker. Rare alcohol use No illicit drug use Married. Lives in [place], [place].  Review of Systems    General:  No chills, fever, night sweats or weight changes.  Cardiovascular:  No chest pain, dyspnea on exertion, edema, orthopnea, palpitations, paroxysmal nocturnal dyspnea. Dermatological: No rash, lesions/masses Respiratory: No cough, dyspnea Urologic: No hematuria, dysuria Abdominal:   No nausea, vomiting, diarrhea, bright red blood per rectum, melena, or hematemesis Neurologic:  No visual changes, wkns, changes in mental status. All other systems reviewed and are otherwise negative except as noted above.  Physical Exam    BP unassisted: 119/78 Augmented diastolic pressure: 213   HR: 87 RR: 18 T: 99.1 F SpO2: 95%  General: Elderly man, lying in bed.  He alert and is in  no acute distress. HEENT: normal Vascular: No carotid bruits; Right femoral intra-aortic balloon pump is in place without hematoma or bleeding. 1+ right DP and 2+ left DP pulse. Right radial TR band, 1+ distal radial pulse, 2+ left radial pulse.  Cardiac: Regular rate and rhythm without murmurs, rubs, or gallops appreciated. Mechanical IABP sounds. Lungs: Trace dependent rales. Normal work of breathing. Abd: Soft, nontender, no hepatomegaly  Ext: No  significant lower extremity edema. Skin: warm and dry, no bruising Neuro:  Alert, conversant, cranial nerves grossly intact, no focal abnormalities noted  Labs    Troponin (Point of Care Test) No results for input(s): TROPIPOC in the last 72 hours. Recent Labs    07/16/18 2201  TROPONINI 3.08*   Lab Results  Component Value Date   WBC 13.5 (H) 07/16/2018   HGB 17.6 (H) 07/16/2018   HCT 53.1 (H) 07/16/2018   MCV 91.4 07/16/2018   PLT 247 07/16/2018    Recent Labs  Lab 07/16/18 2201  NA 138  K 3.9  CL 102  CO2 26  BUN 16  CREATININE 1.35*  CALCIUM 8.8*  GLUCOSE 109*   No results found for: CHOL, HDL, LDLCALC, TRIG No results found for: [facility]   Radiology Studies    Dg Chest Port 1 View  Result Date: 07/17/2018 CLINICAL DATA:  73 year old male with STEMI involving the LAD and acute heart failure. EXAM: PORTABLE CHEST 1 VIEW COMPARISON:  None. FINDINGS: There is mild cardiomegaly with mild vascular congestion and probable mild interstitial edema. No focal consolidation, pleural effusion, or pneumothorax. No acute osseous pathology. IMPRESSION: Cardiomegaly with findings of mild CHF.  No focal consolidation. Electronically Signed   By: Anner Crete M.D.   On: 07/17/2018 03:02    ECG & Cardiac Imaging    EKG:  personally reviewed and demonstrates: Normal sinus rhythm with anterior ST segment elevation.  LHC/PCI (07/16/2018): Multivessel CAD with moderate LCx and RCA disease.  Severe ostial RPDA stenosis is noted.  Culprit lesion is a thrombotic 99% stenosis in the proximal LAD.  IVIS demonstrates grossly undersized stents throughout the proximal and mid LAD and diffuse plaquing of the LAD.  Successful PCI to the proximal LAD using a Synergy 2.75 x 16 mm drug-eluting stent (postdilated to 3.4 mm) with 0% residual stenosis and TIMI-3 flow.  LVEF severely reduced with anterior and apical akinesis.  LVEF approximately 25%.  LVEDP approximately 40 mmHg.  Assessment & Plan      Anterior STEMI: Presentation consistent with acute plaque rupture in the proximal LAD just before a remotely placed stent.  IVUS demonstrates extensive plaquing of the LAD with undersizing of prior stents.  Patient was successfully treated with a single drug-eluting stent in the proximal LAD overlapping previously placed stent.  He is currently chest pain-free and hemodynamically stable.  Given severely reduced LVEF and elevated LVEDP, IABP was placed for hemodynamic support.   Transferred to [facility] for post STEMI care, including IABP management as appropriate nursing is not available at [facility] for maintenance of IABP.  DAPT with aspirin and ticagrelor for at least 12 months.  Aggressive secondary prevention including high intensity statin therapy - note that the patient is currently refusing statin therapy due to misconceptions/skepticism regarding perceived harms. I discussed importance of statins and proven safety - please follow-up in the morning.  Lipids, A1c, complete TTE  Cardiac rehab at discharge  Smoking cessation discussed/encouraged  Acute systolic heart failure LVEF severely reduced with significant anterior wall motion abnormality and severely  elevated LVEDP.  Given history of prior MIs and stents in the LAD, some degree of cardiomyopathy may be chronic.  We do not have records from outside hospital detailing prior cardiac care/interventions.  IABP care 1:1 and wean as tolerated  Position confirmed on CXR here.  Heparin per pharmacy dosing  Continue diuresis.  Consider adding beta-blocker and ACE inhibitor/ARB once heart failure improves and blood pressure/renal function tolerate.  Monitor strict I&Os and daily weights  Routine Care:  Nutrition: Low sodium diet  DVT PPx: therapeutic heparin  Mobility: Bed rest while IABP in place  GOC: Full code  Disposition: Due to critical illness status post anterior STEMI with severe acute systolic heart failure  status post intra-aortic balloon pump, pt transferred to [facility].  Signed, Marykay Lex, MD 07/17/2018, 3:23 AM

## 2018-07-17 NOTE — Progress Notes (Signed)
ANTICOAGULATION CONSULT NOTE - Initial Consult  Pharmacy Consult for heparin Indication: IABP  No Known Allergies  Patient Measurements: Heparin Dosing Weight: 81.6kg  Vital Signs: Temp: 99.1 F (37.3 C) (02/25 2203) Temp Source: Oral (02/25 2203) BP: 153/92 (02/26 0054) Pulse Rate: 80 (02/26 0054)  Labs: Recent Labs    07/16/18 2201  HGB 17.6*  HCT 53.1*  PLT 247  APTT 35  LABPROT 12.5  INR 0.9  CREATININE 1.35*  TROPONINI 3.08*    Estimated Creatinine Clearance: 51.9 mL/min (A) (by C-G formula based on SCr of 1.35 mg/dL (H)).   Medical History: Past Medical History:  Diagnosis Date  . MI (myocardial infarction) ([place])     Medications:  Scheduled:  . aspirin  81 mg Oral Daily  . atorvastatin  80 mg Oral q1800  . ticagrelor  90 mg Oral BID    Assessment: 73yo male presented to [facility] as code STEMI, now s/p cardiac cath where IABP was placed in the setting of acute MI complicated by severe acute heart failure, now transferred to [place] for higher level of care, to start heparin infusion.  Goal of Therapy:  Heparin level 0.2-0.5 units/ml Monitor platelets by anticoagulation protocol: Yes   Plan:  Rec'd heparin in cath lab; will start heparin gtt at 1000 units/hr and monitor heparin levels and CBC.  Wynona Neat, PharmD, BCPS  07/17/2018,2:57 AM

## 2018-07-17 NOTE — Progress Notes (Signed)
  Echocardiogram 2D Echocardiogram has been performed.  Nicholas Schultz L Androw 07/17/2018, 11:20 AM

## 2018-07-17 NOTE — Care Management (Signed)
#   3.    S/W  TANIA  @ [facility] RX # [id number]   TICAGRELOR : NON-FORMULARY   BRILINTA  90 MG BID COVER- YES CO-PAY- $ 45.00 TIER-   3 DRUG PRIOR APPROVAL- NO  PREFERRED PHARMACY  : ANY RETAIL 90 DAY SUPPLY FOR [employer] M/O  $ 135.00

## 2018-07-18 ENCOUNTER — Encounter (HOSPITAL_COMMUNITY): Payer: Self-pay

## 2018-07-18 DIAGNOSIS — I2102 ST elevation (STEMI) myocardial infarction involving left anterior descending coronary artery: Principal | ICD-10-CM

## 2018-07-18 DIAGNOSIS — E782 Mixed hyperlipidemia: Secondary | ICD-10-CM

## 2018-07-18 DIAGNOSIS — I5021 Acute systolic (congestive) heart failure: Secondary | ICD-10-CM

## 2018-07-18 LAB — BASIC METABOLIC PANEL
ANION GAP: 10 (ref 5–15)
BUN: 16 mg/dL (ref 8–23)
CO2: 28 mmol/L (ref 22–32)
Calcium: 8.7 mg/dL — ABNORMAL LOW (ref 8.9–10.3)
Chloride: 97 mmol/L — ABNORMAL LOW (ref 98–111)
Creatinine, Ser: 1.46 mg/dL — ABNORMAL HIGH (ref 0.61–1.24)
GFR calc Af Amer: 55 mL/min — ABNORMAL LOW (ref >60)
GFR calc non Af Amer: 47 mL/min — ABNORMAL LOW (ref >60)
Glucose, Bld: 124 mg/dL — ABNORMAL HIGH (ref 70–99)
Potassium: 3.7 mmol/L (ref 3.5–5.1)
Sodium: 135 mmol/L (ref 135–145)

## 2018-07-18 LAB — CBC
HCT: 52.2 % — ABNORMAL HIGH (ref 39.0–52.0)
Hemoglobin: 17.4 g/dL — ABNORMAL HIGH (ref 13.0–17.0)
MCH: 29.6 pg (ref 26.0–34.0)
MCHC: 33.3 g/dL (ref 30.0–36.0)
MCV: 88.9 fL (ref 80.0–100.0)
NRBC: 0 % (ref 0.0–0.2)
Platelets: 233 10*3/uL (ref 150–400)
RBC: 5.87 MIL/uL — ABNORMAL HIGH (ref 4.22–5.81)
RDW: 14.3 % (ref 11.5–15.5)
WBC: 14.1 10*3/uL — AB (ref 4.0–10.5)

## 2018-07-18 MED ORDER — CHLORHEXIDINE GLUCONATE CLOTH 2 % EX PADS
6.0000 | MEDICATED_PAD | Freq: Every day | CUTANEOUS | Status: DC
  Administered 2018-07-18 – 2018-07-19 (×2): 6 via TOPICAL

## 2018-07-18 NOTE — Progress Notes (Signed)
Overnight pt noted to become tachycardic 140s-150s when ambulating to the bathroom. He denied any discomfort, shortness of breath, or dizziness.  A strip has been saved in Epic and placed in pt chart. Will continue to monitor.

## 2018-07-18 NOTE — Care Management Note (Addendum)
Case Management Note  Patient Details  Name: Nicholas Schultz MRN: [id number] Date of Birth: 1945-09-01  Subjective/Objective: 73 yo male presented with a STEMI; s/p cath with revascularization of the LAD.                  Action/Plan: CM met with patient/family to discuss transitional needs. Patient states living at home with his spouse and being independent with his ADLs. Patient confirmed as having [name], but his spouse stated she will assist patient with establishing a PCP in [place]. Patient has active health insurance with [facility]. Brilinta benefits check is complete with est monthly copay cost $45; CM discussed copay cost and provided patient with a Brilinta 30-day free card with [facility] pharmacy to fill prior to patient transitioning home. Patient will need a LifeVest, with options discussed with patient and the requested paperwork provided to [name], [employer] rep. No further needs from CM.   Expected Discharge Date:                  Expected Discharge Plan:  Home/Self Care  In-House Referral:  NA  Discharge planning Services  CM Consult, Medication Assistance(Brilinta)  Post Acute Care Choice:  NA Choice offered to:  NA  DME Arranged:  Life vest DME Agency:  [employer]  HH Arranged:  NA HH Agency:  NA  Status of Service:  In process, will continue to follow  If discussed at Long Length of Stay Meetings, dates discussed:    Additional Comments: 07/18/18 @ 1412-[name] RNCM-CM informed by [name] rep, insurance approval for the [employer] have been received; a rep from [place] will fit patient for the device.    [name] RN, BSN, NCM-BC, ACM-RN [phone] 07/18/2018, 12:01 PM

## 2018-07-18 NOTE — Progress Notes (Signed)
Pt received from [place]. CHG complete. BP 86/69 (75). Pt asymptomatic. Pt and family oriented to room and unit. Dinner tray ordered. Will continue to monitor.  Clyde Canterbury, RN

## 2018-07-18 NOTE — Progress Notes (Addendum)
Progress Note  Patient Name: Nicholas Schultz Date of Encounter: 07/18/2018  Primary Cardiologist: No primary care provider on file.   Subjective   Feeling well this AM. Ambulated yesterday without issue. He is not having any CP or SHOB. He would like to leave today. All questions and concerns addressed.   Inpatient Medications    Scheduled Meds: . aspirin  81 mg Oral Daily  . atorvastatin  80 mg Oral q1800  . Chlorhexidine Gluconate Cloth  6 each Topical Q0600  . enoxaparin (LOVENOX) injection  40 mg Subcutaneous Q24H  . ticagrelor  90 mg Oral BID   Continuous Infusions:  PRN Meds: acetaminophen, nitroGLYCERIN   Vital Signs    Vitals:   07/18/18 0400 07/18/18 0500 07/18/18 0645 07/18/18 0803  BP: 98/70 99/71 95/66    Pulse:   91   Resp: 17 (!) 21 18   Temp:    98.3 F (36.8 C)  TempSrc:    Oral  SpO2:   96%   Weight:   78.1 kg   Height:        Intake/Output Summary (Last 24 hours) at 07/18/2018 0829 Last data filed at 07/17/2018 2200 Gross per 24 hour  Intake 270 ml  Output 1475 ml  Net -1205 ml   Filed Weights   07/17/18 0500 07/18/18 0323 07/18/18 0645  Weight: 80.8 kg 80.8 kg 78.1 kg   Telemetry    NSR with occasional tachycardia - Personally Reviewed  ECG    No new EKG  Physical Exam   GEN: No acute distress.   Neck: No JVD Cardiac: RRR, no murmurs, rubs, or gallops.  Respiratory: Clear to auscultation bilaterally. GI: Soft, nontender, non-distended  MS: No edema; No deformity. Neuro:  Nonfocal  Psych: Normal affect   Labs    Chemistry Recent Labs  Lab 07/16/18 2201 07/17/18 0555 07/18/18 0336  NA 138 137 135  K 3.9 4.0 3.7  CL 102 101 97*  CO2 26 21* 28  GLUCOSE 109* 125* 124*  BUN 16 12 16   CREATININE 1.35* 1.41* 1.46*  CALCIUM 8.8* 8.8* 8.7*  GFRNONAA 52* 49* 47*  GFRAA 60* 57* 55*  ANIONGAP 10 15 10     Hematology Recent Labs  Lab 07/16/18 2201 07/17/18 0555 07/18/18 0336  WBC 13.5* 16.3* 14.1*  RBC 5.81 6.08* 5.87*    HGB 17.6* 18.1* 17.4*  HCT 53.1* 54.3* 52.2*  MCV 91.4 89.3 88.9  MCH 30.3 29.8 29.6  MCHC 33.1 33.3 33.3  RDW 14.4 14.5 14.3  PLT 247 256 233   Cardiac Enzymes Recent Labs  Lab 07/16/18 2201 07/17/18 0555 07/17/18 1653  TROPONINI 3.08* >65.00* 46.77*   No results for input(s): TROPIPOC in the last 168 hours.   BNP Recent Labs  Lab 07/17/18 0555  BNP 1,275.2*    DDimer No results for input(s): DDIMER in the last 168 hours.   Radiology    Dg Chest Port 1 View  Result Date: 07/17/2018 CLINICAL DATA:  73 year old male with STEMI involving the LAD and acute heart failure. EXAM: PORTABLE CHEST 1 VIEW COMPARISON:  None. FINDINGS: There is mild cardiomegaly with mild vascular congestion and probable mild interstitial edema. No focal consolidation, pleural effusion, or pneumothorax. No acute osseous pathology. IMPRESSION: Cardiomegaly with findings of mild CHF.  No focal consolidation. Electronically Signed   By: Anner Crete M.D.   On: 07/17/2018 03:02   Cardiac Studies   Left Heart Cath 07/16/2018 1. Significant two-vessel coronary artery disease with 50% ostial and  thrombotic 99% proximal LAD stenoses, as well as 70% ostial RPDA stenosis.  IVUS of the LAD shows extensive plaque with underexpansion of previously placed stent(s). 2. Moderate, nonobstructive coronary artery disease involving the LMCA and LCx. 3. Severely reduced left ventricular systolic function (LVEF ~20%) with anterior akinesis. 4. Severely elevated left ventricular filling pressure (LVEDP ~40 mmHg). 5. Successful IVUS guided PCI of the proximal LAD using Synergy 2.75 x 16 mm drug-eluting stent (postdilated to 3.4 mm) with 0% residual stenosis and TIMI-3 flow.  Though the stent remains somewhat undersized, further post dilation was not pursued due to extensive plaque throughout the LAD and concern for further plaque shift into the LMCA and LCx. 6. Successful placement of a 50 mL intra-aortic balloon pump via  the right common femoral artery.  TTE 07/17/2018  1. The left ventricle has moderate-severely reduced systolic function, with an ejection fraction of 30-35%. The cavity size was normal. Left ventricular diastolic Doppler parameters are consistent with pseudonormalization Elevated left ventricular  end-diastolic pressure.  2. There is akinesis of the apical septal, mid and apical lateral, apical, apical inferior, mid and apical anterior and mid anteroseptal walls. There is hypokinesis of the inferolateral wall.  3. The right ventricle has normal systolic function. The cavity was normal. There is no increase in right ventricular wall thickness.  4. The mitral valve is normal in structure.  5. The tricuspid valve is normal in structure.  6. The aortic valve was not well visualized.  7. The pulmonic valve was normal in structure.  Patient Profile     Nicholas Schultz is a 73 y.o. male with a hx of CAD s/p "three heart attacks and 4 uncoated stents," who presented to the hospital with an STEMI and subsequently went for revascularization of the LAD. S/p a IABP was placed and he was transferred to [facility] for further evaluation and post STEMI care.   Assessment & Plan    Acute STEMI  CAD - No CP or SHOB with ambulation  - IABP removed yesterday  - Continue ASA 81 mg, Ticagrelor 90 mg, and Atorvastatin 80 mg.  - Will need to start beta-blocker  - Renal function slightly up from baseline. Continue to monitor and if improves patient would need ACE/ARB/ARNI.  - Reluctant to take any medications.   Ischemic Cardiomyopathy  HFrEF, Acute - LVEF 30-35%  - Will need to start beta-blocker  - Renal function slightly up from baseline. Continue to monitor and if improves patient would need ACE/ARB/ARNI.  - May need a life vest prior to discharge   HLD - Continue Atorvastatin   Polycythemia Vera  - Hgb > 17 and Hct >52  - Would need further work up to determine primary vs secondary but based on history it is  likely secondary  - Work-up can be pursued as outpatient  Stable for transfer to cardiac tele. Will discuss further with Dr. Irish Lack.   For questions or updates, please contact [place] Please consult [email or web address] for contact info under Cardiology/STEMI.     Signed, Ina Homes, MD  07/18/2018, 8:29 AM    I have examined the patient and reviewed assessment and plan and discussed with patient.  Agree with above as stated.    Discussed lifevest at length.  He is agreeable after speaking to company rep.    BP low for further medical therapy.  Will have to reassess after he is more active.   He will need DAPT for ideally 1 year. He has laready stated  that he does not like to take meds and would be willing to take meds for 3 months.   Larae Grooms

## 2018-07-18 NOTE — Progress Notes (Signed)
Spoke to Kathyrn Drown, NP about pt consistently low BP. Orders to continue observing pt for symptoms of hypotension and orthostatics. Will continue to monitor.  Clyde Canterbury, RN

## 2018-07-19 ENCOUNTER — Telehealth: Payer: Self-pay | Admitting: Internal Medicine

## 2018-07-19 DIAGNOSIS — D45 Polycythemia vera: Secondary | ICD-10-CM

## 2018-07-19 DIAGNOSIS — I5021 Acute systolic (congestive) heart failure: Secondary | ICD-10-CM

## 2018-07-19 DIAGNOSIS — I255 Ischemic cardiomyopathy: Secondary | ICD-10-CM

## 2018-07-19 DIAGNOSIS — I2510 Atherosclerotic heart disease of native coronary artery without angina pectoris: Secondary | ICD-10-CM

## 2018-07-19 DIAGNOSIS — E785 Hyperlipidemia, unspecified: Secondary | ICD-10-CM

## 2018-07-19 DIAGNOSIS — Z9861 Coronary angioplasty status: Secondary | ICD-10-CM

## 2018-07-19 LAB — BASIC METABOLIC PANEL
Anion gap: 10 (ref 5–15)
BUN: 18 mg/dL (ref 8–23)
CO2: 24 mmol/L (ref 22–32)
Calcium: 8.4 mg/dL — ABNORMAL LOW (ref 8.9–10.3)
Chloride: 100 mmol/L (ref 98–111)
Creatinine, Ser: 1.31 mg/dL — ABNORMAL HIGH (ref 0.61–1.24)
GFR calc Af Amer: >60 mL/min (ref >60)
GFR calc non Af Amer: 54 mL/min — ABNORMAL LOW (ref >60)
Glucose, Bld: 96 mg/dL (ref 70–99)
Potassium: 3.7 mmol/L (ref 3.5–5.1)
Sodium: 134 mmol/L — ABNORMAL LOW (ref 135–145)

## 2018-07-19 LAB — CBC
HCT: 49.2 % (ref 39.0–52.0)
Hemoglobin: 16.8 g/dL (ref 13.0–17.0)
MCH: 30.1 pg (ref 26.0–34.0)
MCHC: 34.1 g/dL (ref 30.0–36.0)
MCV: 88.2 fL (ref 80.0–100.0)
Platelets: 214 10*3/uL (ref 150–400)
RBC: 5.58 MIL/uL (ref 4.22–5.81)
RDW: 14.3 % (ref 11.5–15.5)
WBC: 12.1 10*3/uL — AB (ref 4.0–10.5)
nRBC: 0 % (ref 0.0–0.2)

## 2018-07-19 MED ORDER — ASPIRIN EC 81 MG PO TBEC: 81.0000 mg | DELAYED_RELEASE_TABLET | Freq: Every day | ORAL | 11 refills | Status: AC

## 2018-07-19 MED ORDER — TICAGRELOR 90 MG PO TABS: 90.0000 mg | ORAL_TABLET | Freq: Two times a day (BID) | ORAL | 0 refills | Status: DC

## 2018-07-19 MED ORDER — ATORVASTATIN CALCIUM 80 MG PO TABS: 80.0000 mg | ORAL_TABLET | Freq: Every day | ORAL | 5 refills | Status: DC

## 2018-07-19 MED ORDER — NITROGLYCERIN 0.4 MG SL SUBL: 0.4000 mg | SUBLINGUAL_TABLET | SUBLINGUAL | 2 refills | Status: AC | PRN

## 2018-07-19 MED ORDER — TICAGRELOR 90 MG PO TABS: 90.0000 mg | ORAL_TABLET | Freq: Two times a day (BID) | ORAL | 11 refills | Status: DC

## 2018-07-19 MED FILL — NITROGLYCERIN 0.4 MG TAB SL: 0.4 | 8 days supply | Qty: 25 | Fill #0 | Status: TO

## 2018-07-19 MED FILL — ASPIRIN LOW DOSE 81 MG TBEC: 81 | 30 days supply | Qty: 30 | Fill #0 | Status: TO

## 2018-07-19 MED FILL — BRILINTA 90 MG TABLET: 90 | 30 days supply | Qty: 60 | Fill #0 | Status: TO

## 2018-07-19 MED FILL — ATORVASTATIN CALCIUM 80 MG: 80 | 30 days supply | Qty: 30 | Fill #0 | Status: TO

## 2018-07-19 NOTE — Telephone Encounter (Signed)
The patient was discharged today. He will be a TCM call for Monday 3/2.

## 2018-07-19 NOTE — Telephone Encounter (Signed)
TCM....  Patient is being discharged   They saw Nelva Bush, MD  They are scheduled to see Murray Hodgkins, NP on 07/30/18  They were seen for STEMI, HF  They need to be seen within 7-10 days   Please call

## 2018-07-19 NOTE — Progress Notes (Addendum)
Progress Note  Patient Name: Nicholas Schultz Date of Encounter: 07/19/2018  Primary Cardiologist: No primary care provider on file.   Subjective   Patient feeling well today. Able to ambulate without CP or SHOB. He wants to go home. Has his life vest. Tells me he will take an ASA and Ticagrelor but otherwise does not want any other medications.   Inpatient Medications    Scheduled Meds: . aspirin  81 mg Oral Daily  . atorvastatin  80 mg Oral q1800  . Chlorhexidine Gluconate Cloth  6 each Topical Q0600  . enoxaparin (LOVENOX) injection  40 mg Subcutaneous Q24H  . ticagrelor  90 mg Oral BID   Continuous Infusions:  PRN Meds: acetaminophen, nitroGLYCERIN   Vital Signs    Vitals:   07/18/18 1700 07/18/18 1705 07/18/18 2036 07/19/18 0443  BP: (!) 84/70 (!) 83/69 111/75 95/71  Pulse: 94 98 66 81  Resp: 19 17 14 16   Temp: 98 F (36.7 C)  98.6 F (37 C) 98.2 F (36.8 C)  TempSrc: Oral  Oral Oral  SpO2: 96% 96% 98% 94%  Weight:    79.2 kg  Height:        Intake/Output Summary (Last 24 hours) at 07/19/2018 0832 Last data filed at 07/18/2018 1700 Gross per 24 hour  Intake 360 ml  Output -  Net 360 ml   Filed Weights   07/18/18 0323 07/18/18 0645 07/19/18 0443  Weight: 80.8 kg 78.1 kg 79.2 kg   Telemetry    NSR - Personally Reviewed  ECG    No new EKG  Physical Exam   GEN: No acute distress.   Neck: No JVD Cardiac: RRR, no murmurs, rubs, or gallops.  Respiratory: Clear to auscultation bilaterally. GI: Soft, nontender, non-distended  MS: No edema; No deformity. Neuro:  Nonfocal  Psych: Normal affect   Labs    Chemistry Recent Labs  Lab 07/17/18 0555 07/18/18 0336 07/19/18 0309  NA 137 135 134*  K 4.0 3.7 3.7  CL 101 97* 100  CO2 21* 28 24  GLUCOSE 125* 124* 96  BUN 12 16 18   CREATININE 1.41* 1.46* 1.31*  CALCIUM 8.8* 8.7* 8.4*  GFRNONAA 49* 47* 54*  GFRAA 57* 55* >60  ANIONGAP 15 10 10     Hematology Recent Labs  Lab 07/17/18 0555  07/18/18 0336 07/19/18 0309  WBC 16.3* 14.1* 12.1*  RBC 6.08* 5.87* 5.58  HGB 18.1* 17.4* 16.8  HCT 54.3* 52.2* 49.2  MCV 89.3 88.9 88.2  MCH 29.8 29.6 30.1  MCHC 33.3 33.3 34.1  RDW 14.5 14.3 14.3  PLT 256 233 214   Cardiac Enzymes Recent Labs  Lab 07/16/18 2201 07/17/18 0555 07/17/18 1653  TROPONINI 3.08* >65.00* 46.77*   No results for input(s): TROPIPOC in the last 168 hours.   BNP Recent Labs  Lab 07/17/18 0555  BNP 1,275.2*    DDimer No results for input(s): DDIMER in the last 168 hours.   Radiology    No results found. Cardiac Studies   Left Heart Cath 07/16/2018 1. Significant two-vessel coronary artery disease with 50% ostial and thrombotic 99% proximal LAD stenoses, as well as 70% ostial RPDA stenosis.  IVUS of the LAD shows extensive plaque with underexpansion of previously placed stent(s). 2. Moderate, nonobstructive coronary artery disease involving the LMCA and LCx. 3. Severely reduced left ventricular systolic function (LVEF ~22%) with anterior akinesis. 4. Severely elevated left ventricular filling pressure (LVEDP ~40 mmHg). 5. Successful IVUS guided PCI of the proximal LAD  using Synergy 2.75 x 16 mm drug-eluting stent (postdilated to 3.4 mm) with 0% residual stenosis and TIMI-3 flow.  Though the stent remains somewhat undersized, further post dilation was not pursued due to extensive plaque throughout the LAD and concern for further plaque shift into the LMCA and LCx. 6. Successful placement of a 50 mL intra-aortic balloon pump via the right common femoral artery.  TTE 07/17/2018  1. The left ventricle has moderate-severely reduced systolic function, with an ejection fraction of 30-35%. The cavity size was normal. Left ventricular diastolic Doppler parameters are consistent with pseudonormalization Elevated left ventricular  end-diastolic pressure.  2. There is akinesis of the apical septal, mid and apical lateral, apical, apical inferior, mid and apical  anterior and mid anteroseptal walls. There is hypokinesis of the inferolateral wall.  3. The right ventricle has normal systolic function. The cavity was normal. There is no increase in right ventricular wall thickness.  4. The mitral valve is normal in structure.  5. The tricuspid valve is normal in structure.  6. The aortic valve was not well visualized.  7. The pulmonic valve was normal in structure.  Patient Profile     Nicholas Schultz is a 73 y.o. male with a hx of CAD s/p "three heart attacks and 4 uncoated stents," who presented to the hospital with an STEMI and subsequently went for revascularization of the LAD. S/p a IABP was placed and he was transferred to [facility] for further evaluation and post STEMI care.   Assessment & Plan    Acute STEMI  CAD - No CP or SHOB with ambulation  - Continue ASA 81 mg, Ticagrelor 90 mg, and Atorvastatin 80 mg.  - Would start Metoprolol succinate 12.5 mg QD but patient declining  - Low BP may preclude the use of an ACE/ARB/ARNI. Would recommend outpatient follow-up/assessment for initiation  - Reluctant to take any medications.   Ischemic Cardiomyopathy  HFrEF, Acute - LVEF 30-35%  - Would start Metoprolol succinate 12.5 mg QD but patient declining  - Low BP may preclude the use of an ACE/ARB/ARNI. Would recommend outpatient follow-up/assessment for initiation  - Has LifeVest    HLD - Continue Atorvastatin   Polycythemia Vera  - Hgb > 17 and Hct >52  - Would need further work up to determine primary vs secondary but based on history it is likely secondary  - Work-up can be pursued as outpatient  Will discuss further with Dr. Irish Lack.   For questions or updates, please contact [place] Please consult [email or web address] for contact info under Cardiology/STEMI.     Signed, Ina Homes, MD   I have examined the patient and reviewed assessment and plan and discussed with patient.  Agree with above as stated.  BP does not allow CHF meds.  He  will take DAPT.  I recommend this for 1 year.  He has been fitted for life vest.  Plan discharge today.  He states he is leaving today whether we recommend this or not.   Larae Grooms  07/19/2018, 8:32 AM

## 2018-07-19 NOTE — Progress Notes (Signed)
Nicholas Schultz to be D/C'd Home per MD order. Discussed with the patient and all questions fully answered.    IV catheter discontinued intact. Site without signs and symptoms of complications. Dressing and pressure applied.  An After Visit Summary was printed and given to the patient.  Patient to be escorted via [place], and D/C home via private auto.  [name]  07/19/2018 11:46 AM  2

## 2018-07-19 NOTE — Discharge Summary (Addendum)
Discharge Summary    Patient ID: Nicholas Schultz MRN: [id number]; DOB: 01/26/1946  Admit date: 07/17/2018 Discharge date: 07/19/2018  Primary Care Provider: Patient, [name] Per  Primary Cardiologist: Nelva Bush, MD  Primary Electrophysiologist:  None   Discharge Diagnoses    Active Problems:   Acute ST elevation myocardial infarction (STEMI) [facility])   CAD S/P percutaneous coronary angioplasty prox LAD   Ischemic cardiomyopathy   Acute systolic heart failure ([place])   HLD (hyperlipidemia)   Allergies No Known Allergies  Diagnostic Studies/Procedures    Procedures   Coronary/Graft Acute MI Revascularization  IABP Insertion  LEFT HEART CATH AND CORONARY ANGIOGRAPHY 07/16/18  Conclusion   Conclusions: 1. Significant two-vessel coronary artery disease with 50% ostial and thrombotic 99% proximal LAD stenoses, as well as 70% ostial RPDA stenosis.  IVUS of the LAD shows extensive plaque with underexpansion of previously placed stent(s). 2. Moderate, nonobstructive coronary artery disease involving the LMCA and LCx. 3. Severely reduced left ventricular systolic function (LVEF ~57%) with anterior akinesis. 4. Severely elevated left ventricular filling pressure (LVEDP ~40 mmHg). 5. Successful IVUS guided PCI of the proximal LAD using Synergy 2.75 x 16 mm drug-eluting stent (postdilated to 3.4 mm) with 0% residual stenosis and TIMI-3 flow.  Though the stent remains somewhat undersized, further post dilation was not pursued due to extensive plaque throughout the LAD and concern for further plaque shift into the LMCA and LCx. 6. Successful placement of a 50 mL intra-aortic balloon pump via the right common femoral artery.  Recommendations: 1. Given acute MI with severe acute systolic heart failure requiring intra-aortic balloon pump placement, patient will be transferred to [facility] for continued care. 2. Dual antiplatelet therapy with aspirin and ticagrelor for at least 12 months, ideally  longer. 3. Wean intra-aortic balloon pump as tolerated. 4. Continue diuresis with IV furosemide. 5. Aggressive secondary prevention including high intensity statin therapy. 6. Medical therapy for non-critical ostial rPDA stenosis unless patient has recurrent angina refractory to medical therapy.  Nelva Bush, MD    2D Echo 07/17/18  IMPRESSIONS    1. The left ventricle has moderate-severely reduced systolic function, with an ejection fraction of 30-35%. The cavity size was normal. Left ventricular diastolic Doppler parameters are consistent with pseudonormalization Elevated left ventricular  end-diastolic pressure.  2. There is akinesis of the apical septal, mid and apical lateral, apical, apical inferior, mid and apical anterior and mid anteroseptal walls. There is hypokinesis of the inferolateral wall.  3. The right ventricle has normal systolic function. The cavity was normal. There is no increase in right ventricular wall thickness.  4. The mitral valve is normal in structure.  5. The tricuspid valve is normal in structure.  6. The aortic valve was not well visualized.  7. The pulmonic valve was normal in structure.  History of Present Illness     Nicholas Schultz is a 73 y/o male w/ CAD who presented initially to [facility] on 07/16/18 as a CODE STEMI.   Per H&P, pt reports having 3 prior heart attacks but has not followed with a physician for many years. He only takes aspirin and states that he will not take more than 3 months of dual antiplatelet therapy due to bruising while on aspirin and clopidogrel in the past.   He was in his usual state of health until approximately 7 PM on 07/15/18, when he had acute onset of substernal chest pain without radiation. He denied accompanying symptoms including shortness of breath, nausea, and diaphoresis. He presented  to the emergency department at [facility] early morning hours on 07/16/18, where he was found to have anterior ST segment elevation. Code  STEMI was called.  He was transported to the cardiac cathlab, where he was found to have a thrombotic 99% stenosis in the proximal LAD just before a remotely placed stent. Lesion was successfully treated with a single drug-eluting stent overlapping the old stent. Patient was noted to have severely reduced LVEF and severely elevated LVEDP. Therefore, intra-aortic balloon pump was placed in the setting of acute MI complicated by severe acute heart failure. Pt was treated w/ IV Lasix and transferred to [facility] for further care and admitted to the ICU.   Hospital Course     Pt was admitted to the ICU for IABP management and post STEMI care. His troponin rose to > 65 but showed downward trend post PCI. He was placed on DAPT w/ ASA and Brilinta. High intensity statin was also added, Lipitor 80 mg. His LDL was 221 mg/dL. BP did not support HF medications. Unable to start ACE/ARB and  blocker due to borderline hypotension. He was ultimately weaned off of the IABP. He denied any recurrent CP. No dyspnea. Echo was done and showed reduced LVEF at 30-35%. Life Vest was recommended prior to d/c for primary prevention of SCD. Pt was fitted with life vest prior to discharge. He had no significant post cath complications. He ambulated with cardiac rehab w/o difficulty. On 07/19/18, he was last seen and examined by Dr. Irish Lack who felt that he was stable for discharge home. TOC post hospital f/u has been arranged in [place] on 07/30/2018. If BP allows, will need to try to initiate guidelines directed medical therapy for systolic CHF (Consider ARB or Entresto, with later consideration of  blocker and spironolactone, pending BP and HR). Once on medical therapy, will need repeat echocardiogram after 3 months of maximally tolerated HF medications. If EF remains < 35%, he will need referral to EP for consideration of ICD. He will also need repeat FLP and HFTs in 6-8 weeks. Target LDL goal will be < 70 mg/dL. If not at goal,  consider addition of Zetia and possibly PCSK9 inhibitor therapy.   Consultants: none   Discharge Vitals Blood pressure 95/71, pulse 81, temperature 98.2 F (36.8 C), temperature source Oral, resp. rate 16, height 5\' 11"  (1.803 m), weight 79.2 kg, SpO2 94 %.  Filed Weights   07/18/18 0323 07/18/18 0645 07/19/18 0443  Weight: 80.8 kg 78.1 kg 79.2 kg    Labs & Radiologic Studies    CBC Recent Labs    07/18/18 0336 07/19/18 0309  WBC 14.1* 12.1*  HGB 17.4* 16.8  HCT 52.2* 49.2  MCV 88.9 88.2  PLT 233 761   Basic Metabolic Panel Recent Labs    07/17/18 0555 07/18/18 0336 07/19/18 0309  NA 137 135 134*  K 4.0 3.7 3.7  CL 101 97* 100  CO2 21* 28 24  GLUCOSE 125* 124* 96  BUN 12 16 18   CREATININE 1.41* 1.46* 1.31*  CALCIUM 8.8* 8.7* 8.4*  MG 2.0  --   --    Liver Function Tests No results for input(s): AST, ALT, ALKPHOS, BILITOT, PROT, ALBUMIN in the last 72 hours. No results for input(s): LIPASE, AMYLASE in the last 72 hours. Cardiac Enzymes Recent Labs    07/16/18 2201 07/17/18 0555 07/17/18 1653  TROPONINI 3.08* >65.00* 46.77*   BNP Invalid input(s): POCBNP D-Dimer No results for input(s): DDIMER in the last 72 hours. Hemoglobin  A1C Recent Labs    07/17/18 0555  HGBA1C 5.7*   Fasting Lipid Panel Recent Labs    07/17/18 0555  CHOL 269*  HDL 40*  LDLCALC 211*  TRIG 88  CHOLHDL 6.7   Thyroid Function Tests No results for input(s): TSH, T4TOTAL, T3FREE, THYROIDAB in the last 72 hours.  Invalid input(s): FREET3 _____________  Dg Chest Port 1 View  Result Date: 07/17/2018 CLINICAL DATA:  73 year old male with STEMI involving the LAD and acute heart failure. EXAM: PORTABLE CHEST 1 VIEW COMPARISON:  None. FINDINGS: There is mild cardiomegaly with mild vascular congestion and probable mild interstitial edema. No focal consolidation, pleural effusion, or pneumothorax. No acute osseous pathology. IMPRESSION: Cardiomegaly with findings of mild CHF.  No  focal consolidation. Electronically Signed   By: Anner Crete M.D.   On: 07/17/2018 03:02   Disposition   Pt is being discharged home today in good condition.  Follow-up Plans & Appointments    Follow-up Information    Theora Gianotti, NP Follow up on 07/30/2018.   Specialties:  Nurse Practitioner, Cardiology, Radiology Why:  2:00 PM (Dr. Darnelle Bos NP)  Contact information: [place] [address] [phone]            Discharge Medications   Allergies as of 07/19/2018   No Known Allergies     Medication List    TAKE these medications   aspirin EC 81 MG tablet Take 1 tablet (81 mg total) by mouth daily.   atorvastatin 80 MG tablet Commonly known as:  LIPITOR Take 1 tablet (80 mg total) by mouth daily at 6 PM.   flavoxATE 100 MG tablet Commonly known as:  URISPAS Take 100 mg by mouth as needed (urinary frequency).   nitroGLYCERIN 0.4 MG SL tablet Commonly known as:  NITROSTAT Place 1 tablet (0.4 mg total) under the tongue every 5 (five) minutes x 3 doses as needed for chest pain.   ticagrelor 90 MG Tabs tablet Commonly known as:  BRILINTA Take 1 tablet (90 mg total) by mouth 2 (two) times daily.   ticagrelor 90 MG Tabs tablet Commonly known as:  BRILINTA Take 1 tablet (90 mg total) by mouth 2 (two) times daily.        Acute coronary syndrome (MI, NSTEMI, STEMI, etc) this admission?: Yes.     AHA/ACC Clinical Performance & Quality Measures: 1. Aspirin prescribed? - Yes 2. ADP Receptor Inhibitor (Plavix/Clopidogrel, Brilinta/Ticagrelor or Effient/Prasugrel) prescribed (includes medically managed patients)? - Yes 3. Beta Blocker prescribed? - No - soft BP 4. High Intensity Statin (Lipitor 40-80mg  or Crestor 20-40mg ) prescribed? - Yes 5. EF assessed during THIS hospitalization? - Yes 6. For EF <40%, was ACEI/ARB prescribed? - No - Reason:  soft BP 7. For EF <40%, Aldosterone Antagonist (Spironolactone or Eplerenone)  prescribed? - No - Reason:  soft BP 8. Cardiac Rehab Phase II ordered (Included Medically managed Patients)? - Yes     Outstanding Labs/Studies   FLP + HFTs in 6-8 weeks. LDL goal < 70 mg/dL Repeat Echo after 3 months of maximally tolerated HF medications. Refer to EP if LVEF remains < 35%  Duration of Discharge Encounter   Greater than 30 minutes including physician time.  Signed, Lyda Jester, PA-C 07/19/2018, 12:30 PM  I have examined the patient and reviewed assessment and plan and discussed with patient.  Agree with above as stated.  BP does not allow CHF meds.  He will take DAPT.  I recommend this for  1 year.  He has been fitted for life vest.  Plan discharge today.  He states he is leaving today whether we recommend this or not.   Larae Grooms

## 2018-07-22 NOTE — Telephone Encounter (Signed)
Patient contacted regarding discharge from [facility] on 07/19/18.  Patient understands to follow up with provider Ignacia Bayley, NP on 07/30/18 at 2 pm at [facility]. Patient understands discharge instructions? yes Patient understands medications and regiment? yes Patient understands to bring all medications to this visit? yes

## 2018-07-23 NOTE — Progress Notes (Signed)
Transitions of Care Follow Up Call Note  Marlee Trentman is an 73 y.o. male who presented to [facility] on 07/17/2018.  The patient had the following prescriptions filled at [facility]: aspirin, atorvastatin, nitroglycerin, and Brilinta  Patient was called by pharmacist and HIPAA identifiers were verified. The following questions were asked about the prescriptions filled at [facility]:  Has the patient been experiencing any side effects to the medications prescribed? no Understanding of regimen: good Understanding of indications: good Potential of compliance: poor  Pharmacist comments: patient states he has been taking his medicine as prescribed. However, he states that after 90 days he feels he will not want to continue taking these medicines. Instructed patient to follow with cardiologist and encouraged him to continue therapy as prescribed until his follow up appointment.   [x]  Patient's prescriptions filled at the [facility] were transferred to the following pharmacy: [facility] on [address] in [place], [place] []  Patient unable to be reached after calling three times and prescriptions filled at the [facility] were transferred to preferred pharmacy found within their chart.   Ronna Polio 07/23/2018, 5:23 PM Transitions of Care Pharmacy Hours: Monday - Friday 8:30am to 5:00 PM  Phone - [phone]

## 2018-07-26 NOTE — Progress Notes (Signed)
Office Visit    Patient Name: Nicholas Schultz Date of Encounter: 07/30/2018  Primary Care Provider:  Patient, [name] Per Primary Cardiologist:  Nelva Bush, MD  Chief Complaint    73 y.o.malewith a past medical history of tobacco use, CAD status post 3 MI's with prior stenting in [name], who presents for follow-up today after recent anterior MI requiring LAD stenting and balloon pump.   Past Medical History    Past Medical History:  Diagnosis Date  . CAD (coronary artery disease)    a. s/p prior MI and stenting x 4 in [place], [place]; b. 06/2018 Ant STEMI/PCI: LM mod dzs, LAD 50ost, 99p (2.75x16 Synergy DES), 30p/m ISR, LCX nl, OM1 90, OM2 mod dzs, RCA large, 38m ISR, RPDA 70ost, RPAV 50.  [name] HFrEF (heart failure with reduced ejection fraction) ([place])    a. 06/2018 Echo: EF 30-35%.  . Ischemic cardiomyopathy    a. 06/2018 Echo: EF 30-35%, apical septal, mid and apical lateral, apical, apical inf, mid and apical ant, and mid antsept AK w/ inflat HK.   . Tobacco abuse    a. 1 cigar/day. Prev smoked 3ppd cigarettes.   Past Surgical History:  Procedure Laterality Date  . CORONARY ANGIOPLASTY WITH STENT PLACEMENT    . CORONARY/GRAFT ACUTE MI REVASCULARIZATION N/A 07/16/2018   Procedure: Coronary/Graft Acute MI Revascularization;  Surgeon: Nelva Bush, MD;  Location: [facility];  Service: Cardiovascular;  Laterality: N/A;  . IABP INSERTION N/A 07/16/2018   Procedure: IABP Insertion;  Surgeon: Nelva Bush, MD;  Location: [facility];  Service: Cardiovascular;  Laterality: N/A;  . LEFT HEART CATH AND CORONARY ANGIOGRAPHY N/A 07/16/2018   Procedure: LEFT HEART CATH AND CORONARY ANGIOGRAPHY;  Surgeon: Nelva Bush, MD;  Location: [facility];  Service: Cardiovascular;  Laterality: N/A;    Allergies  No Known Allergies  History of Present Illness   73 y.o.malewith above past medical history including coronary artery disease status post 3  prior MIs with stenting on each occasion in [name].  He believes his last event in [name] was greater than 3 years ago.  Other history includes ongoing tobacco/cigar usage with a prior history of heavy tobacco usage.  He has been living in [place] for about 3 years now.  He was previously only using aspirin.  He recently presented to [facility] on February 26 with worsening chest pain and was found to have anterior ST segment elevation.  He underwent emergent diagnostic catheterization where he was found to have a thrombotic 99% stenosis in the proximal LAD just proximal to a remotely placed underexpanded stent.  The LAD was successfully treated with a drug-eluting stent, which overlapped the previously placed stent.  He was noted to have significant LV dysfunction with elevated LVEDP and hypotension requiring intra-aortic balloon pump placement.  Given high acuity, he was transferred to [name] for further evaluation.  There, echocardiogram showed an EF of 30 to 35% with multiple wall motion abnormalities.  Due to ongoing soft blood pressures and hypotension, beta-blocker, ACE inhibitor/ARB/Entresto/spironolactone were not able to be initiated.  Balloon pump was able to be weaned and he was stable for discharge on February 28.  A LifeVest was placed prior to discharge.   Since his discharge, he reports doing reasonably well.  He states that he is up and walking around, trying to stay as active as possible; he tries to walk at least a mile a day and has done  so without any symptoms or limitations.. He adds that he has been reducing his water weight. His groin has healed with no complications. He reports that he smokes one cigar a day and does not plan to quit anytime soon. He does not check his blood pressures at home. He denies chest discomfort, leg swelling, lightheadedness, dizziness and shortness of breath.  He says that in the past, he had an agreement with his  cardiologist that he would only take aspirin.  Currently, he says he is willing to take aspirin, Brilinta, and statin therapy but does not wish to have anything added to his regimen.  He is not currently considering cardiac rehab because he thinks he can do just as well at home.  Home Medications    Prior to Admission medications   Medication Sig Start Date End Date Taking? Authorizing Provider  aspirin EC 81 MG tablet Take 1 tablet (81 mg total) by mouth daily. 07/19/18   Lyda Jester M, PA-C  atorvastatin (LIPITOR) 80 MG tablet Take 1 tablet (80 mg total) by mouth daily at 6 PM. 07/19/18   Consuelo Pandy, PA-C  flavoxATE (URISPAS) 100 MG tablet Take 100 mg by mouth as needed (urinary frequency).    [provider]  nitroGLYCERIN (NITROSTAT) 0.4 MG SL tablet Place 1 tablet (0.4 mg total) under the tongue every 5 (five) minutes x 3 doses as needed for chest pain. 07/19/18   Consuelo Pandy, PA-C  ticagrelor (BRILINTA) 90 MG TABS tablet Take 1 tablet (90 mg total) by mouth 2 (two) times daily. 07/19/18   Consuelo Pandy, PA-C  ticagrelor (BRILINTA) 90 MG TABS tablet Take 1 tablet (90 mg total) by mouth 2 (two) times daily. 07/19/18   Consuelo Pandy, PA-C    Review of Systems   Overall doing well since discharge.  He denies chest pain, palpitations, dyspnea, pnd, orthopnea, n, v, dizziness, syncope, edema, weight gain, or early satiety. All other systems reviewed and are otherwise negative except as noted above.  Physical Exam    VS:  BP 112/70 (BP Location: Left Arm, Patient Position: Sitting, Cuff Size: Normal)   Pulse 81   Ht 5\' 11"  (1.803 m)   Wt 183 lb 12 oz (83.3 kg)   BMI 25.63 kg/m  , BMI Body mass index is 25.63 kg/m. GEN: Well nourished, well developed, in no acute distress. HEENT: normal. Neck: Supple, no JVD, carotid bruits, or masses. Cardiac: RRR, no murmurs, rubs, or gallops. No clubbing, cyanosis, edema.  Radials/PT 2+ and equal bilaterally.   Right groin catheterization/balloon pump site without bleeding, bruit, or hematoma. Respiratory:  Respirations regular and unlabored, clear to auscultation bilaterally. GI: Soft, nontender, nondistended, BS + x 4. MS: no deformity or atrophy. Skin: warm and dry, no rash. Neuro:  Strength and sensation are intact. Psych: Normal affect.  Accessory Clinical Findings    ECG personally reviewed by me today -regular sinus rhythm, 81, inferior infarct, anterior and lateral T wave inversion.  Assessment & Plan    1.  Anterior ST segment elevation myocardial infarction, subsequent episode of care/CAD: Status post admission in February with chest pain and anterior MI complicated by cardiogenic shock and hypotension requiring balloon pump placement.  He had severe LAD disease which was successfully treated with drug-eluting stent, overlapping the previously placed stent.  Since discharge, he has done well.  He is walking at least a mile each day and has not had any recurrent symptoms or limitations.  He reports compliance  with aspirin, statin, and Brilinta therapy.  He is not interested in considering any additional therapy at this time.  He is not interested or considering cardiac rehabilitation and feels that he can walk on his own at home.  Though he initially said he would want to limit Brilinta therapy to a minimum of a few months, he now says that he is willing to take it for 12 months.    2.  Hyperlipidemia: LDL of 211 during hospitalization in February.  He is now on high potency statin therapy.  Plan to follow-up lipids and LFTs in approximately 1 month.  3.  Tobacco abuse: Smoking 1 cigar a day.  Previously smoked heavily.  Complete cessation advised.  4.  Ischemic cardiomyopathy/HFrEF: Euvolemic on examination today.  Blood pressure 112/70.  We did discuss potentially adding a low-dose of lisinopril however, he is fairly adamant that he does not want to add any additional medications.  He does  have a LifeVest in place and says he is willing to wear it for 3 months.  We will plan to follow-up echo in approximately 3 months to reevaluate LV function.  If EF reduced at that time, we did discuss that we would likely refer to electrophysiology and at least at this point, he is not against that idea.  We discussed the importance of daily weights, sodium restriction, medication compliance, and symptom reporting and he verbalizes understanding.   5.  Disposition: Follow-up lipids and LFTs as well as office follow-up in approximately 1 month.  Yetta Glassman am acting as a Education administrator for Murray Hodgkins, NP.  I have reviewed the above documentation for accuracy and completeness, and I agree with the above.   Murray Hodgkins, NP 07/30/2018, 5:44 PM

## 2018-07-30 ENCOUNTER — Encounter: Payer: Self-pay | Admitting: Nurse Practitioner

## 2018-07-30 ENCOUNTER — Ambulatory Visit: Payer: Medicare HMO | Admitting: Nurse Practitioner

## 2018-07-30 VITALS — BP 112/70 | HR 81 | Ht 71.0 in | Wt 183.8 lb

## 2018-07-30 DIAGNOSIS — E785 Hyperlipidemia, unspecified: Secondary | ICD-10-CM | POA: Diagnosis not present

## 2018-07-30 DIAGNOSIS — I2510 Atherosclerotic heart disease of native coronary artery without angina pectoris: Secondary | ICD-10-CM

## 2018-07-30 DIAGNOSIS — I255 Ischemic cardiomyopathy: Secondary | ICD-10-CM | POA: Diagnosis not present

## 2018-07-30 DIAGNOSIS — I2109 ST elevation (STEMI) myocardial infarction involving other coronary artery of anterior wall: Secondary | ICD-10-CM

## 2018-07-30 DIAGNOSIS — I5022 Chronic systolic (congestive) heart failure: Secondary | ICD-10-CM

## 2018-07-30 NOTE — Patient Instructions (Signed)
Medication Instructions:  Your physician recommends that you continue on your current medications as directed. Please refer to the Current Medication list given to you today.  If you need a refill on your cardiac medications before your next appointment, please call your pharmacy.   Lab work: Your physician recommends that you return to clinic for lab work in: 4 weeks on ___________ @__________AM /PM. Labs included are; fasting lipids and LFT. We will contact you with results in 1-2 business days.   If you have labs (blood work) drawn today and your tests are completely normal, you will receive your results only by: [name] MyChart Message (if you have MyChart) OR . A paper copy in the mail If you have any lab test that is abnormal or we need to change your treatment, we will call you to review the results.  Testing/Procedures: None ordered   Follow-Up: At [facility], you and your health needs are our priority.  As part of our continuing mission to provide you with exceptional heart care, we have created designated Provider Care Teams.  These Care Teams include your primary Cardiologist (physician) and Advanced Practice Providers (APPs -  Physician Assistants and Nurse Practitioners) who all work together to provide you with the care you need, when you need it. You will need a follow up appointment in 4-6 weeks.  You may see Nelva Bush, MD or Murray Hodgkins, NP

## 2018-08-26 ENCOUNTER — Telehealth: Payer: Self-pay

## 2018-08-26 ENCOUNTER — Telehealth: Payer: Self-pay | Admitting: Internal Medicine

## 2018-08-26 DIAGNOSIS — E785 Hyperlipidemia, unspecified: Secondary | ICD-10-CM

## 2018-08-26 NOTE — Telephone Encounter (Signed)
Lab orders  (lipid, lft). updated to lab collect.

## 2018-08-26 NOTE — Telephone Encounter (Signed)
Virtual Visit Pre-Appointment Phone Call  Steps For Call:  1. Confirm consent - "In the setting of the current Covid19 crisis, you are scheduled for a phone visit with your provider on 09/11/2018 at 9:00AM.  Just as we do with many in-office visits, in order for you to participate in this visit, we must obtain consent.  If you'd like, I can send this to your mychart (if signed up) or email for you to review.  Otherwise, I can obtain your verbal consent now.  All virtual visits are billed to your insurance company just like a normal visit would be.  By agreeing to a virtual visit, we'd like you to understand that the technology does not allow for your provider to perform an examination, and thus may limit your provider's ability to fully assess your condition.  Finally, though the technology is pretty good, we cannot assure that it will always work on either your or our end, and in the setting of a video visit, we may have to convert it to a phone-only visit.  In either situation, we cannot ensure that we have a secure connection.  Are you willing to proceed?"  2. Give patient instructions for WebEx download to smartphone as below if video visit  3. Advise patient to be prepared with any vital sign or heart rhythm information, their current medicines, and a piece of paper and pen handy for any instructions they may receive the day of their visit  4. Inform patient they will receive a phone call 15 minutes prior to their appointment time (may be from unknown caller ID) so they should be prepared to answer  5. Confirm that appointment type is correct in Epic appointment notes (video vs telephone)    TELEPHONE CALL NOTE  Nicholas Schultz has been deemed a candidate for a follow-up tele-health visit to limit community exposure during the Covid-19 pandemic. I spoke with the patient via phone to ensure availability of phone/video source, confirm preferred email & phone number, and discuss instructions and  expectations.  I reminded Nicholas Schultz to be prepared with any vital sign and/or heart rhythm information that could potentially be obtained via home monitoring, at the time of his visit. I reminded Nicholas Schultz to expect a phone call at the time of his visit if his visit.  Did the patient verbally acknowledge consent to treatment? YES  Nicholas Schultz 08/26/2018 11:11 AM  CONSENT FOR TELE-HEALTH VISIT - PLEASE REVIEW  I hereby voluntarily request, consent and authorize [facility] and its employed or contracted physicians, physician assistants, nurse practitioners or other licensed health care professionals (the Practitioner), to provide me with telemedicine health care services (the Services") as deemed necessary by the treating Practitioner. I acknowledge and consent to receive the Services by the Practitioner via telemedicine. I understand that the telemedicine visit will involve communicating with the Practitioner through live audiovisual communication technology and the disclosure of certain medical information by electronic transmission. I acknowledge that I have been given the opportunity to request an in-person assessment or other available alternative prior to the telemedicine visit and am voluntarily participating in the telemedicine visit.  I understand that I have the right to withhold or withdraw my consent to the use of telemedicine in the course of my care at any time, without affecting my right to future care or treatment, and that the Practitioner or I may terminate the telemedicine visit at any time. I understand that I have the right to  inspect all information obtained and/or recorded in the course of the telemedicine visit and may receive copies of available information for a reasonable fee.  I understand that some of the potential risks of receiving the Services via telemedicine include:   Delay or interruption in medical evaluation due to technological equipment failure  or disruption;  Information transmitted may not be sufficient (e.g. poor resolution of images) to allow for appropriate medical decision making by the Practitioner; and/or   In rare instances, security protocols could fail, causing a breach of personal health information.  Furthermore, I acknowledge that it is my responsibility to provide information about my medical history, conditions and care that is complete and accurate to the best of my ability. I acknowledge that Practitioner's advice, recommendations, and/or decision may be based on factors not within their control, such as incomplete or inaccurate data provided by me or distortions of diagnostic images or specimens that may result from electronic transmissions. I understand that the practice of medicine is not an exact science and that Practitioner makes no warranties or guarantees regarding treatment outcomes. I acknowledge that I will receive a copy of this consent concurrently upon execution via email to the email address I last provided but may also request a printed copy by calling the office of [facility].    I understand that my insurance will be billed for this visit.   I have read or had this consent read to me.  I understand the contents of this consent, which adequately explains the benefits and risks of the Services being provided via telemedicine.   I have been provided ample opportunity to ask questions regarding this consent and the Services and have had my questions answered to my satisfaction.  I give my informed consent for the services to be provided through the use of telemedicine in my medical care  By participating in this telemedicine visit I agree to the above.

## 2018-08-26 NOTE — Telephone Encounter (Signed)
Patient spouse calling  Patient is scheduled for labs - lipid and hep function panel  Patient [place] [facility] [place] instead of office Please change orders

## 2018-08-27 ENCOUNTER — Other Ambulatory Visit
Admission: RE | Admit: 2018-08-27 | Discharge: 2018-08-27 | Disposition: A | Discharge status: Home / Self Care | Payer: Medicare HMO | Source: Ambulatory Visit | Attending: Internal Medicine | Admitting: Internal Medicine

## 2018-08-27 ENCOUNTER — Telehealth: Payer: Self-pay | Admitting: *Deleted

## 2018-08-27 ENCOUNTER — Other Ambulatory Visit: Payer: Self-pay

## 2018-08-27 ENCOUNTER — Other Ambulatory Visit: Payer: Medicare HMO

## 2018-08-27 DIAGNOSIS — E785 Hyperlipidemia, unspecified: Secondary | ICD-10-CM | POA: Insufficient documentation

## 2018-08-27 DIAGNOSIS — I2109 ST elevation (STEMI) myocardial infarction involving other coronary artery of anterior wall: Secondary | ICD-10-CM

## 2018-08-27 LAB — HEPATIC FUNCTION PANEL
ALT: 24 U/L (ref 0–44)
AST: 26 U/L (ref 15–41)
Albumin: 3.9 g/dL (ref 3.5–5.0)
Alkaline Phosphatase: 83 U/L (ref 38–126)
Bilirubin, Direct: <0.1 mg/dL (ref 0.0–0.2)
Total Bilirubin: 0.8 mg/dL (ref 0.3–1.2)
Total Protein: 7.3 g/dL (ref 6.5–8.1)

## 2018-08-27 LAB — LIPID PANEL
Cholesterol: 147 mg/dL (ref 0–200)
HDL: 36 mg/dL — ABNORMAL LOW (ref >40)
LDL Cholesterol: 87 mg/dL (ref 0–99)
Total CHOL/HDL Ratio: 4.1 RATIO
Triglycerides: 120 mg/dL (ref <150)
VLDL: 24 mg/dL (ref 0–40)

## 2018-08-27 NOTE — Telephone Encounter (Signed)
Results called to pt. Pt verbalized understanding. Patient expressed that he is not a "pill taker." States he really does not want to be on any medications in the future. He would like to defer starting the zetia at this time and discuss further with provider at f/u telephone visit on 09/11/18. He is aware he may have follow up lab work in a few months as well. Routing to Ignacia Bayley, NP to make him aware.

## 2018-08-27 NOTE — Telephone Encounter (Signed)
-----   Message from Theora Gianotti, NP sent at 08/27/2018  9:16 AM EDT ----- Lipids much improved but not yet at goal.  LDL down from 211 to 87, but goal is <70.  Please add zetia 10 mg daily in addition to current statin therapy.  LFT's wnl.  We can plan to f/u lipids and lft's at a later date, once COVID19 issues are better under control.

## 2018-09-10 NOTE — Progress Notes (Signed)
Virtual Visit via Telephone Note   This visit type was conducted due to national recommendations for restrictions regarding the COVID-19 Pandemic (e.g. social distancing) in an effort to limit this patient's exposure and mitigate transmission in our community.  Due to his co-morbid illnesses, this patient is at least at moderate risk for complications without adequate follow up.  This format is felt to be most appropriate for this patient at this time.  The patient did not have access to video technology/had technical difficulties with video requiring transitioning to audio format only (telephone).  All issues noted in this document were discussed and addressed.  No physical exam could be performed with this format.  Please refer to the patient's chart for his  consent to telehealth for [facility].   Evaluation Performed:  Follow-up visit  Date:  09/11/2018   ID:  Nicholas Schultz, DOB 1946/01/07, MRN [id number]  Patient Location: Home Provider Location: Office  PCP:  Patient, [name] Per  Cardiologist:  Nicholas Bush, MD  Electrophysiologist:  None   Chief Complaint: Follow-up coronary artery disease  History of Present Illness:    Nicholas Schultz is a 73 y.o. male with coronary artery disease with multiple prior MIs and STEMI in late February with primary PCI to proximal LAD complicated by acute systolic heart failure requiring intra-aortic balloon pump placement, ischemic cardiomyopathy, and tobacco use.  He was seen in our office on 07/30/2018 by Nicholas Bayley, NP, for hospital follow-up.  He was doing well at that time.  He refused to have any medications added to his regimen of aspirin, ticagrelor, and a statin despite LVEF of 30-35% post MI.  Today, Nicholas Schultz reports that he is feeling relatively well.  He has no limitations with his activities, though he has not tried mowing his lawn yet.  He is planning to do that later today.  He has not had any chest pain, shortness of breath,  palpitations, lightheadedness, edema, orthopnea, or PND.  He has some easy bruising with ticagrelor and inquires about other options.  He is also somewhat concerned about its price.  He is tolerating atorvastatin well without side effects.  The patient does not have symptoms concerning for COVID-19 infection (fever, chills, cough, or new shortness of breath).    Past Medical History:  Diagnosis Date  . CAD (coronary artery disease)    a. s/p prior MI and stenting x 4 in [place], [place]; b. 06/2018 Ant STEMI/PCI: LM mod dzs, LAD 50ost, 99p (2.75x16 Synergy DES), 30p/m ISR, LCX nl, OM1 90, OM2 mod dzs, RCA large, 64m ISR, RPDA 70ost, RPAV 50.  [name] HFrEF (heart failure with reduced ejection fraction) ([place])    a. 06/2018 Echo: EF 30-35%.  . Ischemic cardiomyopathy    a. 06/2018 Echo: EF 30-35%, apical septal, mid and apical lateral, apical, apical inf, mid and apical ant, and mid antsept AK w/ inflat HK.   . Tobacco abuse    a. 1 cigar/day. Prev smoked 3ppd cigarettes.   Past Surgical History:  Procedure Laterality Date  . CORONARY ANGIOPLASTY WITH STENT PLACEMENT    . CORONARY/GRAFT ACUTE MI REVASCULARIZATION N/A 07/16/2018   Procedure: Coronary/Graft Acute MI Revascularization;  Surgeon: Nicholas Bush, MD;  Location: [place] [facility];  Service: Cardiovascular;  Laterality: N/A;  . IABP INSERTION N/A 07/16/2018   Procedure: IABP Insertion;  Surgeon: Nicholas Bush, MD;  Location: [place] [facility];  Service: Cardiovascular;  Laterality: N/A;  . LEFT HEART CATH AND CORONARY ANGIOGRAPHY N/A  07/16/2018   Procedure: LEFT HEART CATH AND CORONARY ANGIOGRAPHY;  Surgeon: Nicholas Bush, MD;  Location: [place] [facility];  Service: Cardiovascular;  Laterality: N/A;     Current Meds  Medication Sig  . aspirin EC 81 MG tablet Take 1 tablet (81 mg total) by mouth daily.  [name] atorvastatin (LIPITOR) 80 MG tablet Take 1 tablet (80 mg total) by mouth daily at 6 PM.  . flavoxATE (URISPAS) 100 MG  tablet Take 100 mg by mouth as needed (urinary frequency).  . nitroGLYCERIN (NITROSTAT) 0.4 MG SL tablet Place 1 tablet (0.4 mg total) under the tongue every 5 (five) minutes x 3 doses as needed for chest pain.  . ticagrelor (BRILINTA) 90 MG TABS tablet Take 1 tablet (90 mg total) by mouth 2 (two) times daily.     Allergies:   Patient has no known allergies.   Social History   Tobacco Use  . Smoking status: Current Every Day Smoker    Types: Cigars  . Smokeless tobacco: Never Used  Substance Use Topics  . Alcohol use: Yes    Comment: rare  . Drug use: Not on file     Family Hx: The patient's family history is not on file.  ROS:   Please see the history of present illness.   All other systems reviewed and are negative.   Prior CV studies:   The following studies were reviewed today:  TTE (07/17/2018):  1. The left ventricle has moderate-severely reduced systolic function, with an ejection fraction of 30-35%. The cavity size was normal. Left ventricular diastolic Doppler parameters are consistent with pseudonormalization Elevated left ventricular  [name]-diastolic pressure.  2. There is akinesis of the apical septal, mid and apical lateral, apical, apical inferior, mid and apical anterior and mid anteroseptal walls. There is hypokinesis of the inferolateral wall.  3. The right ventricle has normal systolic function. The cavity was normal. There is no increase in right ventricular wall thickness.  4. The mitral valve is normal in structure.  5. The tricuspid valve is normal in structure.  6. The aortic valve was not well visualized.  7. The pulmonic valve was normal in structure.  LHC/PCI (07/16/2018): 1. Significant two-vessel coronary artery disease with 50% ostial and thrombotic 99% proximal LAD stenoses, as well as 70% ostial RPDA stenosis.  IVUS of the LAD shows extensive plaque with underexpansion of previously placed stent(s). 2. Moderate, nonobstructive coronary artery  disease involving the LMCA and LCx. 3. Severely reduced left ventricular systolic function (LVEF ~99%) with anterior akinesis. 4. Severely elevated left ventricular filling pressure (LVEDP ~40 mmHg). 5. Successful IVUS guided PCI of the proximal LAD using Synergy 2.75 x 16 mm drug-eluting stent (postdilated to 3.4 mm) with 0% residual stenosis and TIMI-3 flow.  Though the stent remains somewhat undersized, further post dilation was not pursued due to extensive plaque throughout the LAD and concern for further plaque shift into the LMCA and LCx. 6. Successful placement of a 50 mL intra-aortic balloon pump via the right common femoral artery.  Labs/Other Tests and Data Reviewed:    EKG:  No ECG reviewed.  Recent Labs: 07/17/2018: B Natriuretic Peptide 1,275.2; Magnesium 2.0 07/19/2018: BUN 18; Creatinine, Ser 1.31; Hemoglobin 16.8; Platelets 214; Potassium 3.7; Sodium 134 08/27/2018: ALT 24   Recent Lipid Panel Lab Results  Component Value Date/Time   CHOL 147 08/27/2018 07:55 AM   TRIG 120 08/27/2018 07:55 AM   HDL 36 (L) 08/27/2018 07:55 AM   CHOLHDL 4.1 08/27/2018 07:55  AM   LDLCALC 87 08/27/2018 07:55 AM    Wt Readings from Last 3 Encounters:  09/11/18 175 lb (79.4 kg)  07/30/18 183 lb 12 oz (83.3 kg)  07/19/18 174 lb 11.2 oz (79.2 kg)     Objective:    Vital Signs:  BP 120/88 (BP Location: Left Arm, Patient Position: Sitting, Cuff Size: Normal)   Pulse 71   Ht 5\' 11"  (1.803 m)   Wt 175 lb (79.4 kg)   BMI 24.41 kg/m    VITAL SIGNS:  reviewed  ASSESSMENT & PLAN:    Coronary artery disease: Mr. Even is recovering well from his anterior STEMI with multiple prior PCI's in February.  He has agreed to continue with dual antiplatelet therapy with aspirin and ticagrelor for at least a month.  We discussed switching to clopidogrel or prasugrel due to cost, but given history of significant bleeding with clopidogrel, Mr. Banton wishes to stick with ticagrelor for now.  We will also  continue atorvastatin for secondary prevention.  He is willing to try low-dose metoprolol as well (see details below).  Chronic systolic heart failure secondary to ischemic cardiomyopathy: Mr. Niday reports doing well with NYHA class I-II symptoms.  He previously declined evidence-based heart failure therapy but is agreeable to trying low-dose metoprolol.  We will start him on metoprolol succinate 12.5 mg daily and assess his response in 1 month by phone.  Hyperlipidemia: LDL much improved on lipid panel earlier this month (87, down from 211).  I encouraged Mr. Garman to continue with atorvastatin 80 mg daily.  Hopefully, his LDL will continue to improve with statin therapy and lifestyle modifications.  Tobacco use: Mr. Causby continues to smoke cigars but has cut back some to 1 cigar/day.  I encouraged him to continue cutting back and hopefully stop.  COVID-19 Education: The signs and symptoms of COVID-19 were discussed with the patient and how to seek care for testing (follow up with PCP or arrange E-visit).  The importance of social distancing was discussed today, though the patient does not believe that COVID-19 presents a significant threat and is "no worse than the flu."   Time:   Today, I have spent 15 minutes with the patient with telehealth technology discussing the above problems.  An additional 10 minutes were spent reviewing the patient's chart and documenting today's encounter.   Medication Adjustments/Labs and Tests Ordered: Current medicines are reviewed at length with the patient today.  Concerns regarding medicines are outlined above.   Tests Ordered: No orders of the defined types were placed in this encounter.   Medication Changes: Meds ordered this encounter  Medications  . metoprolol succinate (TOPROL XL) 25 MG 24 hr tablet    Sig: Take 0.5 tablets (12.5 mg total) by mouth daily.    Dispense:  45 tablet    Refill:  1    Disposition:  Follow up in 1  month(s)  Signed, Nicholas Bush, MD  09/11/2018 10:50 AM    [place]

## 2018-09-11 ENCOUNTER — Other Ambulatory Visit: Payer: Self-pay

## 2018-09-11 ENCOUNTER — Telehealth: Payer: Medicare HMO | Admitting: Nurse Practitioner

## 2018-09-11 ENCOUNTER — Encounter: Payer: Self-pay | Admitting: Internal Medicine

## 2018-09-11 ENCOUNTER — Telehealth: Payer: Self-pay | Admitting: *Deleted

## 2018-09-11 ENCOUNTER — Telehealth (INDEPENDENT_AMBULATORY_CARE_PROVIDER_SITE_OTHER): Payer: Medicare HMO | Admitting: Internal Medicine

## 2018-09-11 VITALS — BP 120/88 | HR 71 | Ht 71.0 in | Wt 175.0 lb

## 2018-09-11 DIAGNOSIS — F17290 Nicotine dependence, other tobacco product, uncomplicated: Secondary | ICD-10-CM

## 2018-09-11 DIAGNOSIS — E785 Hyperlipidemia, unspecified: Secondary | ICD-10-CM

## 2018-09-11 DIAGNOSIS — Z720 Tobacco use: Secondary | ICD-10-CM

## 2018-09-11 DIAGNOSIS — I2510 Atherosclerotic heart disease of native coronary artery without angina pectoris: Secondary | ICD-10-CM | POA: Diagnosis not present

## 2018-09-11 DIAGNOSIS — I255 Ischemic cardiomyopathy: Secondary | ICD-10-CM | POA: Diagnosis not present

## 2018-09-11 DIAGNOSIS — I5022 Chronic systolic (congestive) heart failure: Secondary | ICD-10-CM

## 2018-09-11 MED ORDER — METOPROLOL SUCCINATE ER 25 MG PO TB24: 12.5000 mg | ORAL_TABLET | Freq: Every day | ORAL | 1 refills | Status: DC

## 2018-09-11 NOTE — Telephone Encounter (Signed)
No answer after several rings on home number and no VM. Cell number did answer.    Patient verbalized understanding of instructions as listed on AVS.  MyChart activated to patient's email. Message sent to Medical Records to send AVS to patient.

## 2018-09-11 NOTE — Patient Instructions (Signed)
Medication Instructions:  Your physician has recommended you make the following change in your medication:  1- START Metoprolol succinate 12.5 mg (0.5 tablet) by mouth once a day.   If you need a refill on your cardiac medications before your next appointment, please call your pharmacy.   Lab work: none If you have labs (blood work) drawn today and your tests are completely normal, you will receive your results only by: [name] MyChart Message (if you have MyChart) OR . A paper copy in the mail If you have any lab test that is abnormal or we need to change your treatment, we will call you to review the results.  Testing/Procedures: none  Follow-Up: At [facility], you and your health needs are our priority.  As part of our continuing mission to provide you with exceptional heart care, we have created designated Provider Care Teams.  These Care Teams include your primary Cardiologist (physician) and Advanced Practice Providers (APPs -  Physician Assistants and Nurse Practitioners) who all work together to provide you with the care you need, when you need it. You will need a follow up appointment in 1 months evisit with Nelva Bush, MD or one of the following Advanced Practice Providers on your designated Care Team:   Murray Hodgkins, NP Christell Faith, PA-C . Marrianne Mood, PA-C

## 2018-10-18 ENCOUNTER — Other Ambulatory Visit: Payer: Self-pay

## 2018-10-18 ENCOUNTER — Telehealth (INDEPENDENT_AMBULATORY_CARE_PROVIDER_SITE_OTHER): Payer: Medicare HMO | Admitting: Internal Medicine

## 2018-10-18 ENCOUNTER — Encounter: Payer: Self-pay | Admitting: Internal Medicine

## 2018-10-18 VITALS — BP 105/71 | HR 67 | Ht 70.0 in | Wt 177.0 lb

## 2018-10-18 DIAGNOSIS — I5022 Chronic systolic (congestive) heart failure: Secondary | ICD-10-CM | POA: Diagnosis not present

## 2018-10-18 DIAGNOSIS — I255 Ischemic cardiomyopathy: Secondary | ICD-10-CM | POA: Diagnosis not present

## 2018-10-18 DIAGNOSIS — I2510 Atherosclerotic heart disease of native coronary artery without angina pectoris: Secondary | ICD-10-CM

## 2018-10-18 DIAGNOSIS — E785 Hyperlipidemia, unspecified: Secondary | ICD-10-CM

## 2018-10-18 NOTE — Patient Instructions (Signed)
Medication Instructions:  Your physician recommends that you continue on your current medications as directed. Please refer to the Current Medication list given to you today.  If you need a refill on your cardiac medications before your next appointment, please call your pharmacy.   Lab work: None ordered If you have labs (blood work) drawn today and your tests are completely normal, you will receive your results only by: [name] MyChart Message (if you have MyChart) OR . A paper copy in the mail If you have any lab test that is abnormal or we need to change your treatment, we will call you to review the results.  Testing/Procedures: Your physician has requested that you have an echocardiogram. Echocardiography is a painless test that uses sound waves to create images of your heart. It provides your doctor with information about the size and shape of your heart and how well your heart's chambers and valves are working. This procedure takes approximately one hour. There are no restrictions for this procedure.    Follow-Up: At [facility], you and your health needs are our priority.  As part of our continuing mission to provide you with exceptional heart care, we have created designated Provider Care Teams.  These Care Teams include your primary Cardiologist (physician) and Advanced Practice Providers (APPs -  Physician Assistants and Nurse Practitioners) who all work together to provide you with the care you need, when you need it. You will need a follow up appointment in 2-3 months.  Please call our office 2 months in advance to schedule this appointment.  You may see Nelva Bush, MD or one of the following Advanced Practice Providers on your designated Care Team:   Murray Hodgkins, NP Christell Faith, PA-C . Marrianne Mood, PA-C

## 2018-10-18 NOTE — Progress Notes (Signed)
Virtual Visit via Telephone Note   This visit type was conducted due to national recommendations for restrictions regarding the COVID-19 Pandemic (e.g. social distancing) in an effort to limit this patient's exposure and mitigate transmission in our community.  Due to his co-morbid illnesses, this patient is at least at moderate risk for complications without adequate follow up.  This format is felt to be most appropriate for this patient at this time.  The patient did not have access to video technology/had technical difficulties with video requiring transitioning to audio format only (telephone).  All issues noted in this document were discussed and addressed.  No physical exam could be performed with this format.  Please refer to the patient's chart for his  consent to telehealth for [facility].   Date:  10/18/2018   ID:  Dream Nodal, DOB May 28, 1945, MRN [id number]  Patient Location: Home Provider Location: Office  PCP:  Patient, [name] Per  Cardiologist:  Nelva Bush, MD  Electrophysiologist:  None   Evaluation Performed:  Follow-Up Visit  Chief Complaint:  Follow-up CAD and CM  History of Present Illness:    [place] Nicholas Schultz is a 73 y.o. male with  with coronary artery disease with multiple prior MIs and STEMI in late February with primary PCI to proximal LAD complicated by acute systolic heart failure requiring intra-aortic balloon pump placement, ischemic cardiomyopathy, and tobacco use.  We last spoke a month ago, at which time Mr. Makki was doing relatively well.  We agreed to start metoprolol succinate 12.5 mg daily, which he has tolerated well.  He denies chest pain, shortness of breath, palpitations, lightheadedness, edema, and orthopnea.  He is back to mowing his lawn but feels like his stamina is not quite as good as it used to be.  He does not exercise regularly.  He has not had any significant bleeding, remaining on ASA and ticagrelor.  The patient does not have symptoms  concerning for COVID-19 infection (fever, chills, cough, or new shortness of breath).    Past Medical History:  Diagnosis Date  . CAD (coronary artery disease)    a. s/p prior MI and stenting x 4 in [place], [place]; b. 06/2018 Ant STEMI/PCI: LM mod dzs, LAD 50ost, 99p (2.75x16 Synergy DES), 30p/m ISR, LCX nl, OM1 90, OM2 mod dzs, RCA large, 17m ISR, RPDA 70ost, RPAV 50.  [name] HFrEF (heart failure with reduced ejection fraction) ([place])    a. 06/2018 Echo: EF 30-35%.  . Ischemic cardiomyopathy    a. 06/2018 Echo: EF 30-35%, apical septal, mid and apical lateral, apical, apical inf, mid and apical ant, and mid antsept AK w/ inflat HK.   . Tobacco abuse    a. 1 cigar/day. Prev smoked 3ppd cigarettes.   Past Surgical History:  Procedure Laterality Date  . CORONARY ANGIOPLASTY WITH STENT PLACEMENT    . CORONARY/GRAFT ACUTE MI REVASCULARIZATION N/A 07/16/2018   Procedure: Coronary/Graft Acute MI Revascularization;  Surgeon: Nelva Bush, MD;  Location: [place] [facility];  Service: Cardiovascular;  Laterality: N/A;  . IABP INSERTION N/A 07/16/2018   Procedure: IABP Insertion;  Surgeon: Nelva Bush, MD;  Location: [place] [facility];  Service: Cardiovascular;  Laterality: N/A;  . LEFT HEART CATH AND CORONARY ANGIOGRAPHY N/A 07/16/2018   Procedure: LEFT HEART CATH AND CORONARY ANGIOGRAPHY;  Surgeon: Nelva Bush, MD;  Location: [place] [facility];  Service: Cardiovascular;  Laterality: N/A;     Current Meds  Medication Sig  . aspirin EC 81 MG tablet  Take 1 tablet (81 mg total) by mouth daily.  [name] atorvastatin (LIPITOR) 80 MG tablet Take 1 tablet (80 mg total) by mouth daily at 6 PM.  . flavoxATE (URISPAS) 100 MG tablet Take 100 mg by mouth as needed (urinary frequency).  . metoprolol succinate (TOPROL XL) 25 MG 24 hr tablet Take 0.5 tablets (12.5 mg total) by mouth daily.  . nitroGLYCERIN (NITROSTAT) 0.4 MG SL tablet Place 1 tablet (0.4 mg total) under the tongue every 5 (five) minutes  x 3 doses as needed for chest pain.  . ticagrelor (BRILINTA) 90 MG TABS tablet Take 1 tablet (90 mg total) by mouth 2 (two) times daily.     Allergies:   Patient has no known allergies.   Social History   Tobacco Use  . Smoking status: Current Every Day Smoker    Types: Cigars  . Smokeless tobacco: Never Used  Substance Use Topics  . Alcohol use: Yes    Comment: rare  . Drug use: Not on file     Family Hx: The patient's family history is not on file.  ROS:   Please see the history of present illness.   All other systems reviewed and are negative.   Prior CV studies:   The following studies were reviewed today:  TTE (07/17/2018): 1. The left ventricle has moderate-severely reduced systolic function, with an ejection fraction of 30-35%. The cavity size was normal. Left ventricular diastolic Doppler parameters are consistent with pseudonormalization Elevated left ventricular  Chaynce Schafer-diastolic pressure. 2. There is akinesis of the apical septal, mid and apical lateral, apical, apical inferior, mid and apical anterior and mid anteroseptal walls. There is hypokinesis of the inferolateral wall. 3. The right ventricle has normal systolic function. The cavity was normal. There is no increase in right ventricular wall thickness. 4. The mitral valve is normal in structure. 5. The tricuspid valve is normal in structure. 6. The aortic valve was not well visualized. 7. The pulmonic valve was normal in structure.  LHC/PCI (07/16/2018): 1. Significant two-vessel coronary artery disease with 50% ostial and thrombotic 99% proximal LAD stenoses, as well as 70% ostial RPDA stenosis. IVUS of the LAD shows extensive plaque with underexpansion of previously placed stent(s). 2. Moderate, nonobstructive coronary artery disease involving the LMCA and LCx. 3. Severely reduced left ventricular systolic function (LVEF ~35%) with anterior akinesis. 4. Severely elevated left ventricular filling  pressure (LVEDP ~40 mmHg). 5. Successful IVUS guided PCI of the proximal LAD using Synergy 2.75 x 16 mm drug-eluting stent (postdilated to 3.4 mm) with 0% residual stenosis and TIMI-3 flow. Though the stent remains somewhat undersized, further post dilation was not pursued due to extensive plaque throughout the LAD and concern for further plaque shift into the LMCA and LCx. 6. Successful placement of a 50 mL intra-aortic balloon pump via the right common femoral artery.  Labs/Other Tests and Data Reviewed:    EKG:  No ECG reviewed.  Recent Labs: 07/17/2018: B Natriuretic Peptide 1,275.2; Magnesium 2.0 07/19/2018: BUN 18; Creatinine, Ser 1.31; Hemoglobin 16.8; Platelets 214; Potassium 3.7; Sodium 134 08/27/2018: ALT 24   Recent Lipid Panel Lab Results  Component Value Date/Time   CHOL 147 08/27/2018 07:55 AM   TRIG 120 08/27/2018 07:55 AM   HDL 36 (L) 08/27/2018 07:55 AM   CHOLHDL 4.1 08/27/2018 07:55 AM   LDLCALC 87 08/27/2018 07:55 AM    Wt Readings from Last 3 Encounters:  10/18/18 177 lb (80.3 kg)  09/11/18 175 lb (79.4 kg)  07/30/18 183  lb 12 oz (83.3 kg)     Objective:    Vital Signs:  BP 105/71 (BP Location: Left Arm, Patient Position: Sitting, Cuff Size: Normal) Comment: 105/71  Pulse 67   Ht 5\' 10"  (1.778 m)   Wt 177 lb (80.3 kg)   BMI 25.40 kg/m    VITAL SIGNS:  reviewed  ASSESSMENT & PLAN:    Coronary artery disease: No symptoms to suggest worsening coronary insufficiency.  Mr. Hallowell has agreed to continue ASA and ticagrelor, though he would prefer not to complete 12 months.  We will readdress at f/u.  Chronic systolic heart failure due to ischemic cardiomyopathy: Mr. Formisano reports stable NYHA class II HF symptoms.  We will continue metoprolol succinate 12.5 mg daily.  Soft BP precludes further escalation of evidence-based HF therapy at this time.  He previously declined LifeVest following MI.  As it has been 3 months since his MI, we will plan to repeat a limited  echo to reassess LVEF.  He remains ambivalent regarding ICD placement should his EF remain <35%.  Hyperlipidemia: Continue atorvastatin 80 mg daily and work on lifestyle modifications for goal LDL < 70.  COVID-19 Education: The signs and symptoms of COVID-19 were discussed with the patient and how to seek care for testing (follow up with PCP or arrange E-visit).  The importance of social distancing was discussed today.  Time:   Today, I have spent 15 minutes with the patient with telehealth technology discussing the above problems.     Medication Adjustments/Labs and Tests Ordered: Current medicines are reviewed at length with the patient today.  Concerns regarding medicines are outlined above.   Tests Ordered: Orders Placed This Encounter  Procedures  . ECHOCARDIOGRAM LIMITED    Medication Changes: None.  Disposition:  Follow up in 3 month(s) in the office.  Signed, Nelva Bush, MD  10/18/2018 1:27 PM    [facility]

## 2018-11-07 ENCOUNTER — Telehealth: Payer: Self-pay

## 2018-11-07 NOTE — Telephone Encounter (Signed)

## 2018-11-08 ENCOUNTER — Ambulatory Visit (INDEPENDENT_AMBULATORY_CARE_PROVIDER_SITE_OTHER): Payer: Medicare HMO

## 2018-11-08 DIAGNOSIS — I5022 Chronic systolic (congestive) heart failure: Secondary | ICD-10-CM

## 2018-11-11 ENCOUNTER — Telehealth: Payer: Self-pay | Admitting: *Deleted

## 2018-11-11 NOTE — Telephone Encounter (Signed)
Results called to pt. Pt verbalized understanding. Patient expressed he is not interested in ICD or seeing EP at this time. We went ahead and scheduled follow up for the end of July.

## 2018-11-11 NOTE — Telephone Encounter (Signed)
-----   Message from Nelva Bush, MD sent at 11/08/2018  3:13 PM EDT ----- Please let Nicholas Schultz know that his echocardiogram shows that his heart remains severely weakened following his heart attack in February (LVEF 25-30%).  As we discussed at our recent virtual visit, I recommend that he be seen by electrophysiology, if he is willing to consider ICD placement for primary prevention of sudden cardiac death.  We will plan to f/u in the office as planned later this summer.

## 2018-11-21 ENCOUNTER — Other Ambulatory Visit: Payer: Self-pay | Admitting: Cardiology

## 2018-12-12 ENCOUNTER — Telehealth: Payer: Self-pay | Admitting: Internal Medicine

## 2018-12-12 NOTE — Telephone Encounter (Signed)

## 2018-12-12 NOTE — Progress Notes (Signed)
Follow-up Outpatient Visit Date: 12/13/2018  Primary Care Provider: Patient, [name] Per No address on file  Chief Complaint: Follow-up coronary artery disease and chronic systolic heart failure  HPI:  Nicholas Schultz is a 73 y.o. year-old male with history of coronary artery disease with multiple prior MIs and STEMI in late February with primary PCI to proximal LAD complicated by acute systolic heart failure requiring intra-aortic balloon pump placement, ischemic cardiomyopathy, and tobacco use, who presents for follow-up of coronary artery disease and heart failure.  We last spoke in late May, at which time Nicholas Schultz was doing well without chest pain, shortness of breath, palpitations, or lightheadedness.  He noticed some decline in his stamina compared to years past when mowing the grass.  Subsequent echo showed continued severe reduction in LV systolic function (EF 50-53%).  He declined EP consultation to discuss ICD placement.  Today, Nicholas Schultz reports that he is feeling well.  He still has some decreased stamina, though this is not significantly different than a year ago.  He denies chest pain, shortness of breath, palpitations, lightheadedness, orthopnea, and edema.  He is tolerating his current medications well, remaining on aspirin and ticagrelor.  He has previously refused to take additional medications, including ARB and ezetimibe.  He is not interested in a defibrillator.  He is not exercising regularly, but mows his lawn once or twice a week without difficulty.  --------------------------------------------------------------------------------------------------  Past Medical History:  Diagnosis Date  . CAD (coronary artery disease)    a. s/p prior MI and stenting x 4 in [place], [place]; b. 06/2018 Ant STEMI/PCI: LM mod dzs, LAD 50ost, 99p (2.75x16 Synergy DES), 30p/m ISR, LCX nl, OM1 90, OM2 mod dzs, RCA large, 69m ISR, RPDA 70ost, RPAV 50.  [name] HFrEF (heart failure with reduced ejection  fraction) ([place])    a. 06/2018 Echo: EF 30-35%.  . Ischemic cardiomyopathy    a. 06/2018 Echo: EF 30-35%, apical septal, mid and apical lateral, apical, apical inf, mid and apical ant, and mid antsept AK w/ inflat HK.   . Tobacco abuse    a. 1 cigar/day. Prev smoked 3ppd cigarettes.   Past Surgical History:  Procedure Laterality Date  . CORONARY ANGIOPLASTY WITH STENT PLACEMENT    . CORONARY/GRAFT ACUTE MI REVASCULARIZATION N/A 07/16/2018   Procedure: Coronary/Graft Acute MI Revascularization;  Surgeon: Nelva Bush, MD;  Location: [facility];  Service: Cardiovascular;  Laterality: N/A;  . IABP INSERTION N/A 07/16/2018   Procedure: IABP Insertion;  Surgeon: Nelva Bush, MD;  Location: [facility];  Service: Cardiovascular;  Laterality: N/A;  . LEFT HEART CATH AND CORONARY ANGIOGRAPHY N/A 07/16/2018   Procedure: LEFT HEART CATH AND CORONARY ANGIOGRAPHY;  Surgeon: Nelva Bush, MD;  Location: [facility];  Service: Cardiovascular;  Laterality: N/A;    Current Meds  Medication Sig  . aspirin EC 81 MG tablet Take 1 tablet (81 mg total) by mouth daily.  [name] atorvastatin (LIPITOR) 80 MG tablet TAKE 1 TABLET BY MOUTH EVERY DAY AT 6 PM  . flavoxATE (URISPAS) 100 MG tablet Take 100 mg by mouth as needed (urinary frequency).  . metoprolol succinate (TOPROL XL) 25 MG 24 hr tablet Take 0.5 tablets (12.5 mg total) by mouth daily.  . nitroGLYCERIN (NITROSTAT) 0.4 MG SL tablet Place 1 tablet (0.4 mg total) under the tongue every 5 (five) minutes x 3 doses as needed for chest pain.  . ticagrelor (BRILINTA) 90 MG TABS tablet Take 1 tablet (90  mg total) by mouth 2 (two) times daily.    Allergies: Patient has no known allergies.  Social History   Tobacco Use  . Smoking status: Current Every Day Smoker    Types: Cigars  . Smokeless tobacco: Never Used  Substance Use Topics  . Alcohol use: Yes    Comment: rare  . Drug use: Not on file    History reviewed. No  pertinent family history.  Review of Systems: A 12-system review of systems was performed and was negative except as noted in the HPI.  --------------------------------------------------------------------------------------------------  Physical Exam: BP 120/70 (BP Location: Left Arm, Patient Position: Sitting, Cuff Size: Normal)   Pulse 61   Ht 5\' 11"  (1.803 m)   Wt 179 lb 8 oz (81.4 kg)   BMI 25.04 kg/m   General: NAD. HEENT: No conjunctival pallor or scleral icterus. Moist mucous membranes.  OP clear. Neck: Supple without lymphadenopathy, thyromegaly, JVD, or HJR. Lungs: Normal work of breathing. Clear to auscultation bilaterally without wheezes or crackles. Heart: Regular rate and rhythm without murmurs, rubs, or gallops. Non-displaced PMI. Abd: Bowel sounds present. Soft, NT/ND without hepatosplenomegaly Ext: No lower extremity edema. Radial, PT, and DP pulses are 2+ bilaterally. Skin: Warm and dry without rash.  EKG: Normal sinus rhythm with lateral ST/T changes.  No significant change from prior tracings.  Lab Results  Component Value Date   WBC 12.1 (H) 07/19/2018   HGB 16.8 07/19/2018   HCT 49.2 07/19/2018   MCV 88.2 07/19/2018   PLT 214 07/19/2018    Lab Results  Component Value Date   NA 134 (L) 07/19/2018   K 3.7 07/19/2018   CL 100 07/19/2018   CO2 24 07/19/2018   BUN 18 07/19/2018   CREATININE 1.31 (H) 07/19/2018   GLUCOSE 96 07/19/2018   ALT 24 08/27/2018    Lab Results  Component Value Date   CHOL 147 08/27/2018   HDL 36 (L) 08/27/2018   LDLCALC 87 08/27/2018   TRIG 120 08/27/2018   CHOLHDL 4.1 08/27/2018    --------------------------------------------------------------------------------------------------  ASSESSMENT AND PLAN: Coronary artery disease without angina: Nicholas Schultz has recovered well from his anterior STEMI status post primary PCI earlier this year.  He has been reluctant to continue with dual antiplatelet therapy in the past but  is agreeable to staying on ticagrelor for at least 1 more month.  I have encouraged him to complete at least 12 months of DAPT (ideally longer).  We will also continue atorvastatin and metoprolol for secondary prevention.  Chronic systolic heart failure secondary to ischemic cardiomyopathy: Nicholas Schultz appears euvolemic with NYHA class II heart failure symptoms.  LVEF remains severely reduced on follow-up echo last month.  Patient declines referral to EP for consideration of ICD.  He also declines addition of additional evidence-based heart failure therapies, including ACE inhibitor/ARB and aldosterone antagonist.  We will continue current dose of metoprolol succinate; low resting heart rate precludes escalation at this time.  Hyperlipidemia: LDL suboptimally controlled on last check in April despite being on atorvastatin 80 mg daily.  Nicholas Schultz does not wish to add additional medications.  We will continue with current dose of atorvastatin.  Follow-up: Return to clinic in 3 months.  Nelva Bush, MD 12/13/2018 8:51 AM

## 2018-12-13 ENCOUNTER — Encounter: Payer: Self-pay | Admitting: Internal Medicine

## 2018-12-13 ENCOUNTER — Other Ambulatory Visit: Payer: Self-pay

## 2018-12-13 ENCOUNTER — Ambulatory Visit (INDEPENDENT_AMBULATORY_CARE_PROVIDER_SITE_OTHER): Payer: Medicare HMO | Admitting: Internal Medicine

## 2018-12-13 VITALS — BP 120/70 | HR 61 | Ht 71.0 in | Wt 179.5 lb

## 2018-12-13 DIAGNOSIS — I2510 Atherosclerotic heart disease of native coronary artery without angina pectoris: Secondary | ICD-10-CM | POA: Diagnosis not present

## 2018-12-13 DIAGNOSIS — I255 Ischemic cardiomyopathy: Secondary | ICD-10-CM

## 2018-12-13 DIAGNOSIS — E785 Hyperlipidemia, unspecified: Secondary | ICD-10-CM

## 2018-12-13 DIAGNOSIS — I5022 Chronic systolic (congestive) heart failure: Secondary | ICD-10-CM

## 2018-12-13 NOTE — Patient Instructions (Signed)
Medication Instructions:  Your physician recommends that you continue on your current medications as directed. Please refer to the Current Medication list given to you today.  If you need a refill on your cardiac medications before your next appointment, please call your pharmacy.   Lab work: - None ordered.  If you have labs (blood work) drawn today and your tests are completely normal, you will receive your results only by: [name] MyChart Message (if you have MyChart) OR . A paper copy in the mail If you have any lab test that is abnormal or we need to change your treatment, we will call you to review the results.  Testing/Procedures: - None ordered.   Follow-Up: At [facility], you and your health needs are our priority.  As part of our continuing mission to provide you with exceptional heart care, we have created designated Provider Care Teams.  These Care Teams include your primary Cardiologist (physician) and Advanced Practice Providers (APPs -  Physician Assistants and Nurse Practitioners) who all work together to provide you with the care you need, when you need it. You will need a follow up appointment in 3 months.  Please call our office 2 months in advance to schedule this appointment.  You may see Nelva Bush, MD or one of the following Advanced Practice Providers on your designated Care Team:   Murray Hodgkins, NP Christell Faith, PA-C . Marrianne Mood, PA-C

## 2019-03-18 ENCOUNTER — Encounter: Payer: Self-pay | Admitting: Emergency Medicine

## 2019-03-18 ENCOUNTER — Encounter
Admission: EM | Disposition: A | Discharge status: Home / Self Care | Payer: Self-pay | Source: Home / Self Care | Attending: Internal Medicine

## 2019-03-18 ENCOUNTER — Other Ambulatory Visit: Payer: Self-pay

## 2019-03-18 ENCOUNTER — Inpatient Hospital Stay
Admission: EM | Admit: 2019-03-18 | Discharge: 2019-03-20 | DRG: 246 | Disposition: A | Discharge status: Home / Self Care | Payer: Medicare HMO | Attending: Internal Medicine | Admitting: Internal Medicine

## 2019-03-18 DIAGNOSIS — Z7982 Long term (current) use of aspirin: Secondary | ICD-10-CM | POA: Diagnosis not present

## 2019-03-18 DIAGNOSIS — I5042 Chronic combined systolic (congestive) and diastolic (congestive) heart failure: Secondary | ICD-10-CM | POA: Diagnosis present

## 2019-03-18 DIAGNOSIS — I2109 ST elevation (STEMI) myocardial infarction involving other coronary artery of anterior wall: Secondary | ICD-10-CM | POA: Diagnosis present

## 2019-03-18 DIAGNOSIS — Z955 Presence of coronary angioplasty implant and graft: Secondary | ICD-10-CM

## 2019-03-18 DIAGNOSIS — I2510 Atherosclerotic heart disease of native coronary artery without angina pectoris: Secondary | ICD-10-CM | POA: Diagnosis present

## 2019-03-18 DIAGNOSIS — E785 Hyperlipidemia, unspecified: Secondary | ICD-10-CM | POA: Diagnosis present

## 2019-03-18 DIAGNOSIS — T82867A Thrombosis of cardiac prosthetic devices, implants and grafts, initial encounter: Principal | ICD-10-CM | POA: Diagnosis present

## 2019-03-18 DIAGNOSIS — Z7902 Long term (current) use of antithrombotics/antiplatelets: Secondary | ICD-10-CM

## 2019-03-18 DIAGNOSIS — J189 Pneumonia, unspecified organism: Secondary | ICD-10-CM | POA: Diagnosis not present

## 2019-03-18 DIAGNOSIS — I213 ST elevation (STEMI) myocardial infarction of unspecified site: Secondary | ICD-10-CM

## 2019-03-18 DIAGNOSIS — I2102 ST elevation (STEMI) myocardial infarction involving left anterior descending coronary artery: Secondary | ICD-10-CM | POA: Diagnosis present

## 2019-03-18 DIAGNOSIS — N179 Acute kidney failure, unspecified: Secondary | ICD-10-CM | POA: Diagnosis present

## 2019-03-18 DIAGNOSIS — Z20828 Contact with and (suspected) exposure to other viral communicable diseases: Secondary | ICD-10-CM | POA: Diagnosis present

## 2019-03-18 DIAGNOSIS — I255 Ischemic cardiomyopathy: Secondary | ICD-10-CM | POA: Diagnosis present

## 2019-03-18 DIAGNOSIS — F17290 Nicotine dependence, other tobacco product, uncomplicated: Secondary | ICD-10-CM | POA: Diagnosis present

## 2019-03-18 DIAGNOSIS — Y712 Prosthetic and other implants, materials and accessory cardiovascular devices associated with adverse incidents: Secondary | ICD-10-CM | POA: Diagnosis present

## 2019-03-18 DIAGNOSIS — I447 Left bundle-branch block, unspecified: Secondary | ICD-10-CM | POA: Diagnosis present

## 2019-03-18 DIAGNOSIS — I252 Old myocardial infarction: Secondary | ICD-10-CM | POA: Diagnosis not present

## 2019-03-18 DIAGNOSIS — Z79899 Other long term (current) drug therapy: Secondary | ICD-10-CM | POA: Diagnosis not present

## 2019-03-18 DIAGNOSIS — N1831 Chronic kidney disease, stage 3a: Secondary | ICD-10-CM | POA: Diagnosis present

## 2019-03-18 DIAGNOSIS — Z9861 Coronary angioplasty status: Secondary | ICD-10-CM

## 2019-03-18 DIAGNOSIS — T45526A Underdosing of antithrombotic drugs, initial encounter: Secondary | ICD-10-CM | POA: Diagnosis present

## 2019-03-18 DIAGNOSIS — Y831 Surgical operation with implant of artificial internal device as the cause of abnormal reaction of the patient, or of later complication, without mention of misadventure at the time of the procedure: Secondary | ICD-10-CM | POA: Diagnosis present

## 2019-03-18 DIAGNOSIS — D72829 Elevated white blood cell count, unspecified: Secondary | ICD-10-CM

## 2019-03-18 DIAGNOSIS — Z91128 Patient's intentional underdosing of medication regimen for other reason: Secondary | ICD-10-CM | POA: Diagnosis not present

## 2019-03-18 DIAGNOSIS — I5022 Chronic systolic (congestive) heart failure: Secondary | ICD-10-CM | POA: Diagnosis not present

## 2019-03-18 DIAGNOSIS — R079 Chest pain, unspecified: Secondary | ICD-10-CM | POA: Diagnosis present

## 2019-03-18 HISTORY — PX: CORONARY/GRAFT ACUTE MI REVASCULARIZATION: CATH118305

## 2019-03-18 HISTORY — PX: LEFT HEART CATH AND CORONARY ANGIOGRAPHY: CATH118249

## 2019-03-18 LAB — COMPREHENSIVE METABOLIC PANEL
ALT: 31 U/L (ref 0–44)
AST: 91 U/L — ABNORMAL HIGH (ref 15–41)
Albumin: 4.4 g/dL (ref 3.5–5.0)
Alkaline Phosphatase: 91 U/L (ref 38–126)
Anion gap: 14 (ref 5–15)
BUN: 20 mg/dL (ref 8–23)
CO2: 25 mmol/L (ref 22–32)
Calcium: 9.3 mg/dL (ref 8.9–10.3)
Chloride: 100 mmol/L (ref 98–111)
Creatinine, Ser: 1.69 mg/dL — ABNORMAL HIGH (ref 0.61–1.24)
GFR calc Af Amer: 45 mL/min — ABNORMAL LOW (ref >60)
GFR calc non Af Amer: 39 mL/min — ABNORMAL LOW (ref >60)
Glucose, Bld: 119 mg/dL — ABNORMAL HIGH (ref 70–99)
Potassium: 4.5 mmol/L (ref 3.5–5.1)
Sodium: 139 mmol/L (ref 135–145)
Total Bilirubin: 1.1 mg/dL (ref 0.3–1.2)
Total Protein: 8 g/dL (ref 6.5–8.1)

## 2019-03-18 LAB — LIPID PANEL
Cholesterol: 185 mg/dL (ref 0–200)
HDL: 40 mg/dL — ABNORMAL LOW (ref >40)
LDL Cholesterol: 119 mg/dL — ABNORMAL HIGH (ref 0–99)
Total CHOL/HDL Ratio: 4.6 RATIO
Triglycerides: 131 mg/dL (ref <150)
VLDL: 26 mg/dL (ref 0–40)

## 2019-03-18 LAB — CBC WITH DIFFERENTIAL/PLATELET
Abs Immature Granulocytes: 0.12 10*3/uL — ABNORMAL HIGH (ref 0.00–0.07)
Basophils Absolute: 0.1 10*3/uL (ref 0.0–0.1)
Basophils Relative: 0 %
Eosinophils Absolute: 0 10*3/uL (ref 0.0–0.5)
Eosinophils Relative: 0 %
HCT: 51.4 % (ref 39.0–52.0)
Hemoglobin: 17.3 g/dL — ABNORMAL HIGH (ref 13.0–17.0)
Immature Granulocytes: 1 %
Lymphocytes Relative: 12 %
Lymphs Abs: 2.1 10*3/uL (ref 0.7–4.0)
MCH: 30.7 pg (ref 26.0–34.0)
MCHC: 33.7 g/dL (ref 30.0–36.0)
MCV: 91.3 fL (ref 80.0–100.0)
Monocytes Absolute: 1 10*3/uL (ref 0.1–1.0)
Monocytes Relative: 6 %
Neutro Abs: 14.5 10*3/uL — ABNORMAL HIGH (ref 1.7–7.7)
Neutrophils Relative %: 81 %
Platelets: 217 10*3/uL (ref 150–400)
RBC: 5.63 MIL/uL (ref 4.22–5.81)
RDW: 14.3 % (ref 11.5–15.5)
WBC: 17.8 10*3/uL — ABNORMAL HIGH (ref 4.0–10.5)
nRBC: 0 % (ref 0.0–0.2)

## 2019-03-18 LAB — PROTIME-INR
INR: 1.1 (ref 0.8–1.2)
Prothrombin Time: 13.6 seconds (ref 11.4–15.2)

## 2019-03-18 LAB — SARS CORONAVIRUS 2 BY RT PCR (HOSPITAL ORDER, PERFORMED IN ~~LOC~~ HOSPITAL LAB): SARS Coronavirus 2: NEGATIVE

## 2019-03-18 LAB — GLUCOSE, CAPILLARY: Glucose-Capillary: 112 mg/dL — ABNORMAL HIGH (ref 70–99)

## 2019-03-18 LAB — POCT ACTIVATED CLOTTING TIME
Activated Clotting Time: 279 seconds
Activated Clotting Time: 301 seconds

## 2019-03-18 LAB — TROPONIN I (HIGH SENSITIVITY): Troponin I (High Sensitivity): 4890 ng/L (ref <18)

## 2019-03-18 LAB — APTT: aPTT: 36 seconds (ref 24–36)

## 2019-03-18 SURGERY — CORONARY/GRAFT ACUTE MI REVASCULARIZATION
Anesthesia: Moderate Sedation

## 2019-03-18 MED ORDER — HEPARIN SODIUM (PORCINE) 1000 UNIT/ML IJ SOLN
INTRAMUSCULAR | Status: AC
  Filled 2019-03-18: qty 1

## 2019-03-18 MED ORDER — ACETAMINOPHEN 325 MG PO TABS: 650.0000 mg | ORAL_TABLET | ORAL | Status: DC | PRN

## 2019-03-18 MED ORDER — HEPARIN SODIUM (PORCINE) 1000 UNIT/ML IJ SOLN
INTRAMUSCULAR | Status: DC | PRN
  Administered 2019-03-18: 8000 [IU] via INTRAVENOUS

## 2019-03-18 MED ORDER — HEPARIN (PORCINE) IN NACL 1000-0.9 UT/500ML-% IV SOLN
INTRAVENOUS | Status: DC | PRN
  Administered 2019-03-18: 1000 mL

## 2019-03-18 MED ORDER — TICAGRELOR 90 MG PO TABS
ORAL_TABLET | ORAL | Status: DC | PRN
  Administered 2019-03-18: 180 mg via ORAL

## 2019-03-18 MED ORDER — TIROFIBAN HCL IN NACL 5-0.9 MG/100ML-% IV SOLN
INTRAVENOUS | Status: AC
  Filled 2019-03-18: qty 100

## 2019-03-18 MED ORDER — VERAPAMIL HCL 2.5 MG/ML IV SOLN
INTRAVENOUS | Status: DC | PRN
  Administered 2019-03-18: 2.5 mg via INTRAVENOUS

## 2019-03-18 MED ORDER — TIROFIBAN (AGGRASTAT) BOLUS VIA INFUSION
INTRAVENOUS | Status: DC | PRN
  Administered 2019-03-18: 22:00:00 2062.5 ug via INTRAVENOUS

## 2019-03-18 MED ORDER — VERAPAMIL HCL 2.5 MG/ML IV SOLN
INTRAVENOUS | Status: AC
  Filled 2019-03-18: qty 2

## 2019-03-18 MED ORDER — HEPARIN SODIUM (PORCINE) 5000 UNIT/ML IJ SOLN
5000.0000 [IU] | Freq: Three times a day (TID) | INTRAMUSCULAR | Status: DC
  Administered 2019-03-19: 5000 [IU] via SUBCUTANEOUS
  Filled 2019-03-18 (×2): qty 1

## 2019-03-18 MED ORDER — TICAGRELOR 90 MG PO TABS
90.0000 mg | ORAL_TABLET | Freq: Two times a day (BID) | ORAL | Status: DC
  Administered 2019-03-19 – 2019-03-20 (×3): 90 mg via ORAL
  Filled 2019-03-18 (×3): qty 1

## 2019-03-18 MED ORDER — MIDAZOLAM HCL 2 MG/2ML IJ SOLN
INTRAMUSCULAR | Status: DC | PRN
  Administered 2019-03-18: 0.5 mg via INTRAVENOUS

## 2019-03-18 MED ORDER — HEPARIN (PORCINE) IN NACL 1000-0.9 UT/500ML-% IV SOLN
INTRAVENOUS | Status: AC
  Filled 2019-03-18: qty 1000

## 2019-03-18 MED ORDER — SODIUM CHLORIDE 0.9 % IV SOLN: 250.0000 mL | INTRAVENOUS | Status: DC | PRN

## 2019-03-18 MED ORDER — FUROSEMIDE 10 MG/ML IJ SOLN
20.0000 mg | Freq: Once | INTRAMUSCULAR | Status: DC
  Filled 2019-03-18: qty 2

## 2019-03-18 MED ORDER — ONDANSETRON HCL 4 MG/2ML IJ SOLN: 4.0000 mg | Freq: Four times a day (QID) | INTRAMUSCULAR | Status: DC | PRN

## 2019-03-18 MED ORDER — TIROFIBAN HCL IN NACL 5-0.9 MG/100ML-% IV SOLN
INTRAVENOUS | Status: AC | PRN
  Administered 2019-03-18: 0.075 ug/kg/min via INTRAVENOUS

## 2019-03-18 MED ORDER — TICAGRELOR 90 MG PO TABS
ORAL_TABLET | ORAL | Status: AC
  Filled 2019-03-18: qty 2

## 2019-03-18 MED ORDER — SODIUM CHLORIDE 0.9% FLUSH: 3.0000 mL | INTRAVENOUS | Status: DC | PRN

## 2019-03-18 MED ORDER — IOHEXOL 300 MG/ML  SOLN
INTRAMUSCULAR | Status: DC | PRN
  Administered 2019-03-18: 135 mL

## 2019-03-18 MED ORDER — ATROPINE SULFATE 1 MG/10ML IJ SOSY
PREFILLED_SYRINGE | INTRAMUSCULAR | Status: AC
  Filled 2019-03-18: qty 10

## 2019-03-18 MED ORDER — ASPIRIN EC 81 MG PO TBEC
81.0000 mg | DELAYED_RELEASE_TABLET | Freq: Every day | ORAL | Status: DC
  Administered 2019-03-19 – 2019-03-20 (×2): 81 mg via ORAL
  Filled 2019-03-18 (×2): qty 1

## 2019-03-18 MED ORDER — HYDRALAZINE HCL 20 MG/ML IJ SOLN: 10.0000 mg | INTRAMUSCULAR | Status: AC | PRN

## 2019-03-18 MED ORDER — SODIUM CHLORIDE 0.9% FLUSH
3.0000 mL | Freq: Two times a day (BID) | INTRAVENOUS | Status: DC
  Administered 2019-03-19 – 2019-03-20 (×3): 3 mL via INTRAVENOUS

## 2019-03-18 MED ORDER — FENTANYL CITRATE (PF) 100 MCG/2ML IJ SOLN
INTRAMUSCULAR | Status: DC | PRN
  Administered 2019-03-18: 12.5 ug via INTRAVENOUS

## 2019-03-18 MED ORDER — MIDAZOLAM HCL 2 MG/2ML IJ SOLN
INTRAMUSCULAR | Status: AC
  Filled 2019-03-18: qty 2

## 2019-03-18 MED ORDER — ATORVASTATIN CALCIUM 80 MG PO TABS
80.0000 mg | ORAL_TABLET | Freq: Every day | ORAL | Status: DC
  Administered 2019-03-19: 80 mg via ORAL
  Filled 2019-03-18: qty 1

## 2019-03-18 MED ORDER — FENTANYL CITRATE (PF) 100 MCG/2ML IJ SOLN
INTRAMUSCULAR | Status: AC
  Filled 2019-03-18: qty 2

## 2019-03-18 MED ORDER — METOPROLOL SUCCINATE ER 25 MG PO TB24
12.5000 mg | ORAL_TABLET | Freq: Every day | ORAL | Status: DC
  Administered 2019-03-19 – 2019-03-20 (×2): 12.5 mg via ORAL
  Filled 2019-03-18: qty 1
  Filled 2019-03-18: qty 0.5

## 2019-03-18 SURGICAL SUPPLY — 18 items
BALLN TREK RX 3.0X12 (BALLOONS) ×3
BALLN ~~LOC~~ TREK RX 4.0X12 (BALLOONS) ×3
BALLOON TREK RX 3.0X12 (BALLOONS) IMPLANT
BALLOON ~~LOC~~ TREK RX 4.0X12 (BALLOONS) IMPLANT
CATH EAGLE EYE PLAT IMAGING (CATHETERS) ×2 IMPLANT
CATH INFINITI JR4 5F (CATHETERS) ×2 IMPLANT
CATH LAUNCHER 6FR EBU3.5 (CATHETERS) ×2 IMPLANT
COVER PROBE U/S 5X48 (MISCELLANEOUS) ×2 IMPLANT
DEVICE INFLAT 30 PLUS (MISCELLANEOUS) ×2 IMPLANT
DEVICE RAD COMP TR BAND LRG (VASCULAR PRODUCTS) ×2 IMPLANT
GLIDESHEATH SLEND SS 6F .021 (SHEATH) ×2 IMPLANT
KIT MANI 3VAL PERCEP (MISCELLANEOUS) ×3 IMPLANT
PACK CARDIAC CATH (CUSTOM PROCEDURE TRAY) ×3 IMPLANT
STENT SYNERGY DES 3.5X8 (Permanent Stent) ×2 IMPLANT
WIRE G HI TQ BMW 190 (WIRE) ×2 IMPLANT
WIRE HITORQ VERSACORE ST 145CM (WIRE) ×2 IMPLANT
WIRE ROSEN-J .035X260CM (WIRE) ×2 IMPLANT
WIRE RUNTHROUGH .014X180CM (WIRE) ×2 IMPLANT

## 2019-03-18 NOTE — Progress Notes (Signed)
Chaplain provided supportive presence to Nicholas Schultz prior to going to Cath Lab and then sat with wife during procedure. Advanced Directive education was offered to wife who acknowledges the value of the document. She knows his long term wishes and feels she knows enough to make decisions in the future if needed. Chaplain escorted wife to waiting area of ICU and offered a hand off to ICU nurse. Chaplain will hand off AD education for Nicholas Schultz to chaplain on 10.28.2020.     03/18/19 2200  Clinical Encounter Type  Visited With Patient;Family  Visit Type Initial  Referral From Physician  Spiritual Encounters  Spiritual Needs Other (Comment)

## 2019-03-18 NOTE — Consult Note (Signed)
Cardiology Consultation:   Patient ID: Nicholas Schultz MRN: [id number]; DOB: 1946/04/11  Admit date: 03/18/2019 Date of Consult: 03/18/2019  Primary Care Provider: Patient, [name] Per Primary Cardiologist: Nelva Bush, MD  Primary Electrophysiologist:  None    Patient Profile:   Nicholas Schultz is a 73 y.o. male with a hx of coronary artery disease status post multiple MIs (most recently 06/2018 with PCI to the proximal LAD at that time), chronic systolic heart failure secondary to ischemic cardiomyopathy, hyperlipidemia, and tobacco use, who is being seen today for the evaluation of chest pain and abnormal EKG at the request of Dr. Jari Pigg.  History of Present Illness:   Nicholas Schultz was in his usual state of health until approximately 730 this evening.  He had mowed the grass in the afternoon and felt well.  However, he had sudden onset of substernal chest pain with a maximal intensity of 3-4/10.  He took 2 sublingual nitro and aspirin with some improvement in the pain.  He subsequently called EMS and was found to be in a left bundle branch block.  Code STEMI was activated and the patient transported to the ED.  Here, his chest pain had improved to 0.5/10.  Repeat EKG showed resolution of left bundle branch block but anterior ST segment elevation.  Nicholas Schultz denies shortness of breath, palpitations, lightheadedness, and edema.  He states that he ran out of ticagrelor a week ago because there were no refills left on his prescription.  However, his wife later reports that Nicholas Schultz did not wish to refill the medication as he does not like doctors or medications.  Nicholas Schultz was taken for emergent cardiac catheterization which revealed nonocclusive thrombus in the proximal LAD stent.  This was treated with balloon angioplasty and a new stent placed from the ostial LAD through the proximal stented segment.  He is now chest pain-free.  Heart Pathway Score:     Past Medical History:  Diagnosis Date  . CAD  (coronary artery disease)    a. s/p prior MI and stenting x 4 in [place], [place]; b. 06/2018 Ant STEMI/PCI: LM mod dzs, LAD 50ost, 99p (2.75x16 Synergy DES), 30p/m ISR, LCX nl, OM1 90, OM2 mod dzs, RCA large, 101m ISR, RPDA 70ost, RPAV 50.  [name] HFrEF (heart failure with reduced ejection fraction) ([place])    a. 06/2018 Echo: EF 30-35%.  . Ischemic cardiomyopathy    a. 06/2018 Echo: EF 30-35%, apical septal, mid and apical lateral, apical, apical inf, mid and apical ant, and mid antsept AK w/ inflat HK.   . Tobacco abuse    a. 1 cigar/day. Prev smoked 3ppd cigarettes.    Past Surgical History:  Procedure Laterality Date  . CORONARY ANGIOPLASTY WITH STENT PLACEMENT    . CORONARY/GRAFT ACUTE MI REVASCULARIZATION N/A 07/16/2018   Procedure: Coronary/Graft Acute MI Revascularization;  Surgeon: Nelva Bush, MD;  Location: [facility];  Service: Cardiovascular;  Laterality: N/A;  . IABP INSERTION N/A 07/16/2018   Procedure: IABP Insertion;  Surgeon: Nelva Bush, MD;  Location: [facility];  Service: Cardiovascular;  Laterality: N/A;  . LEFT HEART CATH AND CORONARY ANGIOGRAPHY N/A 07/16/2018   Procedure: LEFT HEART CATH AND CORONARY ANGIOGRAPHY;  Surgeon: Nelva Bush, MD;  Location: [facility];  Service: Cardiovascular;  Laterality: N/A;     Home Medications:  Prior to Admission medications   Medication Sig Start Date Treavor Blomquist Date Taking? Authorizing Provider  aspirin EC 81 MG tablet Take 1 tablet (  81 mg total) by mouth daily. 07/19/18   Lyda Jester M, PA-C  atorvastatin (LIPITOR) 80 MG tablet TAKE 1 TABLET BY MOUTH EVERY DAY AT 6 PM 11/21/18   Ameriah Lint, Harrell Gave, MD  flavoxATE (URISPAS) 100 MG tablet Take 100 mg by mouth as needed (urinary frequency).    [provider]  metoprolol succinate (TOPROL XL) 25 MG 24 hr tablet Take 0.5 tablets (12.5 mg total) by mouth daily. 09/11/18   Symphani Eckstrom, Harrell Gave, MD  nitroGLYCERIN (NITROSTAT) 0.4 MG SL tablet Place 1 tablet  (0.4 mg total) under the tongue every 5 (five) minutes x 3 doses as needed for chest pain. 07/19/18   Consuelo Pandy, PA-C  ticagrelor (BRILINTA) 90 MG TABS tablet Take 1 tablet (90 mg total) by mouth 2 (two) times daily. 07/19/18   Consuelo Pandy, PA-C    Inpatient Medications: Scheduled Meds:  Continuous Infusions:  PRN Meds:   Allergies:   No Known Allergies  Social History:   Social History   Socioeconomic History  . Marital status: Married    Spouse name: Not on file  . Number of children: Not on file  . Years of education: Not on file  . Highest education level: Not on file  Occupational History  . Not on file  Social Needs  . Financial resource strain: Not on file  . Food insecurity    Worry: Not on file    Inability: Not on file  . Transportation needs    Medical: Not on file    Non-medical: Not on file  Tobacco Use  . Smoking status: Current Every Day Smoker    Types: Cigars  . Smokeless tobacco: Never Used  Substance and Sexual Activity  . Alcohol use: Yes    Comment: rare  . Drug use: Not on file  . Sexual activity: Not on file  Lifestyle  . Physical activity    Days per week: Not on file    Minutes per session: Not on file  . Stress: Not on file  Relationships  . Social Herbalist on phone: Not on file    Gets together: Not on file    Attends religious service: Not on file    Active member of club or organization: Not on file    Attends meetings of clubs or organizations: Not on file    Relationship status: Not on file  . Intimate partner violence    Fear of current or ex partner: Not on file    Emotionally abused: Not on file    Physically abused: Not on file    Forced sexual activity: Not on file  Other Topics Concern  . Not on file  Social History Narrative  . Not on file    Family History:   History reviewed. No pertinent family history.   ROS:   Review of Systems  Unable to perform ROS: Acuity of condition      Physical Exam/Data:   Vitals:   03/18/19 2015 03/18/19 2016 03/18/19 2220  BP: 123/87    Pulse: 74    Resp: 14    Temp: 98.1 F (36.7 C)  98.2 F (36.8 C)  TempSrc: Oral  Oral  SpO2: 98%    Weight:  82.5 kg    No intake or output data in the 24 hours ending 03/18/19 2244 Last 3 Weights 03/18/2019 12/13/2018 10/18/2018  Weight (lbs) 181 lb 14.4 oz 179 lb 8 oz 177 lb  Weight (kg) 82.509 kg 81.421  kg 80.287 kg     Body mass index is 25.37 kg/m.  General:  Well nourished, well developed, in no acute distress HEENT: normal Lymph: no adenopathy Neck: no JVD Endocrine:  No thryomegaly Vascular: 1+ radial pulses bilaterally.  2+ femoral pulses. Cardiac:  normal S1, S2; RRR; no murmurs or rubs, or gallops Lungs:  clear to auscultation bilaterally, no wheezing, rhonchi or rales  Abd: soft, nontender, no hepatomegaly  Ext: no edema Musculoskeletal:  No deformities, BUE and BLE strength normal and equal Skin: warm and dry  Neuro:  CNs 2-12 intact, no focal abnormalities noted Psych:  Normal affect   EKG:  The EKG was personally reviewed and demonstrates: Normal sinus rhythm with 2 mm inferior ST elevation. Telemetry:  Telemetry was personally reviewed and demonstrates: Normal sinus rhythm with intermittent left bundle branch block.  Relevant CV Studies: LHC/PCI (03/18/2019): 1. Late stent thrombosis of the proximal LAD with 90% stenosis due to non-occlusive thrombus. 2. Otherwise, stable appearance of multivessel coronary artery disease compared with catheterization from 06/2018. 3. Severely elevated left ventricular filling pressure (LVEDP 35-40 mmHg). 4. Successful IVUS-guided PCI to ostial/proximal LAD using Synergy 3.5 x 8 mm drug-eluting stent (post-dilated to 4.2 mm) with 0% residual stenosis and TIMI-3 flow.  Limited TTE (11/08/2018):  1. The left ventricle has severely reduced systolic function, with an ejection fraction of 25-30%. The cavity size was mildly dilated. There  is mildly increased left ventricular wall thickness. Gloabl left ventrical global hypokinesis with severe  hypokinesis of the inferior wall.  2. The aortic valve is grossly normal Mild calcification of the aortic valve.  Laboratory Data:  High Sensitivity Troponin:   Recent Labs  Lab 03/18/19 2016  TROPONINIHS 4,890*     Chemistry Recent Labs  Lab 03/18/19 2016  NA 139  K 4.5  CL 100  CO2 25  GLUCOSE 119*  BUN 20  CREATININE 1.69*  CALCIUM 9.3  GFRNONAA 39*  GFRAA 45*  ANIONGAP 14    Recent Labs  Lab 03/18/19 2016  PROT 8.0  ALBUMIN 4.4  AST 91*  ALT 31  ALKPHOS 91  BILITOT 1.1   Hematology Recent Labs  Lab 03/18/19 2016  WBC 17.8*  RBC 5.63  HGB 17.3*  HCT 51.4  MCV 91.3  MCH 30.7  MCHC 33.7  RDW 14.3  PLT 217   BNPNo results for input(s): BNP, PROBNP in the last 168 hours.  DDimer No results for input(s): DDIMER in the last 168 hours.   Radiology/Studies:  No results found.  Assessment and Plan:   1. Anterior STEMI: Patient presents with anterior STEMI secondary to late stent thrombosis.  Mechanism is likely multiple overlapping stents in the proximal and mid LAD, some of which have been undersized in the past and recent self discontinuation of ticagrelor.  Lesion was successfully treated with balloon angioplasty and placement of a new drug-eluting stent from the proximal edge of the old stent to the ostium of the LAD.  He is now chest pain-free. 1. Continue tirofiban for 6 hours. 2. Dual antiplatelet therapy with aspirin and ticagrelor forever. 3. Aggressive secondary prevention including high intensity statin therapy and tobacco cessation.  Chronic systolic heart failure secondary to ischemic cardiomyopathy: Patient looks euvolemic but had severely elevated LVEDP during tonight's catheterization.  LVEF known to be severely reduced (25 to 30% in 10/2018. 1. Repeat echocardiogram. 2. Gentle diuresis with furosemide 20 mg x 1.  Redose based on  urine output and renal function. 3. Continue metoprolol  succinate 12.5 mg daily.  Patient has previously been intolerant of ACE inhibitors and did not wish to begin ARB. 4. Patient previously refused LifeVest and ICD placement.  Acute kidney injury: Modestly reduced GFR noted today. 1. Gentle diuresis in the setting of severely elevated LVEDP and reduced LVEF. 2. Avoid nephrotoxic drugs.  Hyperlipidemia: 1. Continue atorvastatin 80 mg daily.  For questions or updates, please contact [place] Please consult [email or web address] for contact info under [facility].  Signed, Nelva Bush, MD  03/18/2019 10:44 PM

## 2019-03-18 NOTE — ED Triage Notes (Signed)
Pt brought in from home by [facility] for chest pain/tightness that happened after mowing the lawn around 7:35pm. Pt rated his CP on a 3 or 4 out of 10 but stated that it increased over time to a "more severe pain that mimicked his last heart attack". Pt reports taking 2 nitro pills and 325mg  of baby ASA at home prior to EMS arrival. Per EMS pts BP was 86 over palp that soon returned to 120/78. Pt st that he missed taking his Brillenta and has a pmh of 5 stents and 4 previous heart attacks.   8:14 PM CARDIOLOGIST DR. END @ [facility]

## 2019-03-18 NOTE — ED Provider Notes (Signed)
[facility] Emergency Department Provider Note  ____________________________________________   First MD Initiated Contact with Patient 03/18/19 2015     (approximate)  I have reviewed the triage vital signs and the nursing notes.   HISTORY  Chief Complaint Chest Pain    HPI Nicholas Schultz is a 73 y.o. male with prior coronary disease status post 4 heart attacks with multiple stents who comes in with chest pain.  Patient had sudden onset of moderate chest pain that was a pressure sensation that felt like his prior heart attacks, constant, better with nitro, nothing made it worse.  Currently his pain is minimal.  Initially hypotensive with EMS but then became normotensive.            Past Medical History:  Diagnosis Date  . CAD (coronary artery disease)    a. s/p prior MI and stenting x 4 in [place], [place]; b. 06/2018 Ant STEMI/PCI: LM mod dzs, LAD 50ost, 99p (2.75x16 Synergy DES), 30p/m ISR, LCX nl, OM1 90, OM2 mod dzs, RCA large, 20m ISR, RPDA 70ost, RPAV 50.  [name] HFrEF (heart failure with reduced ejection fraction) ([place])    a. 06/2018 Echo: EF 30-35%.  . Ischemic cardiomyopathy    a. 06/2018 Echo: EF 30-35%, apical septal, mid and apical lateral, apical, apical inf, mid and apical ant, and mid antsept AK w/ inflat HK.   . Tobacco abuse    a. 1 cigar/day. Prev smoked 3ppd cigarettes.    Patient Active Problem List   Diagnosis Date Noted  . Coronary artery disease involving native coronary artery of native heart without angina pectoris 09/11/2018  . Chronic systolic CHF (congestive heart failure) ([place]) 09/11/2018  . Tobacco use 09/11/2018  . CAD S/P percutaneous coronary angioplasty prox LAD 07/19/2018  . Ischemic cardiomyopathy 07/19/2018  . Hyperlipidemia LDL goal <70 07/19/2018    Past Surgical History:  Procedure Laterality Date  . CORONARY ANGIOPLASTY WITH STENT PLACEMENT    . CORONARY/GRAFT ACUTE MI REVASCULARIZATION N/A 07/16/2018   Procedure: Coronary/Graft Acute MI Revascularization;  Surgeon: Nelva Bush, MD;  Location: [place] [facility];  Service: Cardiovascular;  Laterality: N/A;  . IABP INSERTION N/A 07/16/2018   Procedure: IABP Insertion;  Surgeon: Nelva Bush, MD;  Location: [place] [facility];  Service: Cardiovascular;  Laterality: N/A;  . LEFT HEART CATH AND CORONARY ANGIOGRAPHY N/A 07/16/2018   Procedure: LEFT HEART CATH AND CORONARY ANGIOGRAPHY;  Surgeon: Nelva Bush, MD;  Location: [place] [facility];  Service: Cardiovascular;  Laterality: N/A;    Prior to Admission medications   Medication Sig Start Date End Date Taking? Authorizing Provider  aspirin EC 81 MG tablet Take 1 tablet (81 mg total) by mouth daily. 07/19/18   Lyda Jester M, PA-C  atorvastatin (LIPITOR) 80 MG tablet TAKE 1 TABLET BY MOUTH EVERY DAY AT 6 PM 11/21/18   End, Harrell Gave, MD  flavoxATE (URISPAS) 100 MG tablet Take 100 mg by mouth as needed (urinary frequency).    [provider]  metoprolol succinate (TOPROL XL) 25 MG 24 hr tablet Take 0.5 tablets (12.5 mg total) by mouth daily. 09/11/18   End, Harrell Gave, MD  nitroGLYCERIN (NITROSTAT) 0.4 MG SL tablet Place 1 tablet (0.4 mg total) under the tongue every 5 (five) minutes x 3 doses as needed for chest pain. 07/19/18   Consuelo Pandy, PA-C  ticagrelor (BRILINTA) 90 MG TABS tablet Take 1 tablet (90 mg total) by mouth 2 (two) times daily. 07/19/18   Consuelo Pandy,  PA-C    Allergies Patient has no known allergies.  No family history on file.  Social History Social History   Tobacco Use  . Smoking status: Current Every Day Smoker    Types: Cigars  . Smokeless tobacco: Never Used  Substance Use Topics  . Alcohol use: Yes    Comment: rare  . Drug use: Not on file      Review of Systems Constitutional: No fever/chills Eyes: No visual changes. ENT: No sore throat. Cardiovascular: Positive chest pain Respiratory: Denies shortness of  breath. Gastrointestinal: No abdominal pain.  No nausea, no vomiting.  No diarrhea.  No constipation. Genitourinary: Negative for dysuria. Musculoskeletal: Negative for back pain. Skin: Negative for rash. Neurological: Negative for headaches, focal weakness or numbness. All other ROS negative ____________________________________________   PHYSICAL EXAM:  VITAL SIGNS: ED Triage Vitals  Enc Vitals Group     BP 03/18/19 2015 123/87     Pulse Rate 03/18/19 2015 74     Resp 03/18/19 2015 14     Temp 03/18/19 2015 98.1 F (36.7 C)     Temp Source 03/18/19 2015 Oral     SpO2 03/18/19 2015 98 %     Weight 03/18/19 2016 181 lb 14.4 oz (82.5 kg)     Height --      Head Circumference --      Peak Flow --      Pain Score 03/18/19 2016 0     Pain Loc --      Pain Edu? --      Excl. in [place]? --     Constitutional: Alert and oriented. Well appearing and in no acute distress. Eyes: Conjunctivae are normal. EOMI. Head: Atraumatic. Nose: No congestion/rhinnorhea. Mouth/Throat: Mucous membranes are moist.   Neck: No stridor. Trachea Midline. FROM Cardiovascular: Normal rate, regular rhythm. Grossly normal heart sounds.  Good peripheral circulation. Respiratory: Normal respiratory effort.  No retractions. Lungs CTAB. Gastrointestinal: Soft and nontender. No distention. No abdominal bruits.  Musculoskeletal: No lower extremity tenderness nor edema.  No joint effusions. Neurologic:  Normal speech and language. No gross focal neurologic deficits are appreciated.  Skin:  Skin is warm, dry and intact. No rash noted. Psychiatric: Mood and affect are normal. Speech and behavior are normal. GU: Deferred   ____________________________________________   LABS (all labs ordered are listed, but only abnormal results are displayed)  Labs Reviewed  CBC WITH DIFFERENTIAL/PLATELET - Abnormal; Notable for the following components:      Result Value   WBC 17.8 (*)    Hemoglobin 17.3 (*)    Neutro Abs  14.5 (*)    Abs Immature Granulocytes 0.12 (*)    All other components within normal limits  COMPREHENSIVE METABOLIC PANEL - Abnormal; Notable for the following components:   Glucose, Bld 119 (*)    Creatinine, Ser 1.69 (*)    AST 91 (*)    GFR calc non Af Amer 39 (*)    GFR calc Af Amer 45 (*)    All other components within normal limits  LIPID PANEL - Abnormal; Notable for the following components:   HDL 40 (*)    LDL Cholesterol 119 (*)    All other components within normal limits  TROPONIN I (HIGH SENSITIVITY) - Abnormal; Notable for the following components:   Troponin I (High Sensitivity) 4,890 (*)    All other components within normal limits  SARS CORONAVIRUS 2 BY RT PCR (HOSPITAL ORDER, [facility])  PROTIME-INR  APTT   ____________________________________________   ED ECG REPORT I, Vanessa O'Fallon, the attending physician, personally viewed and interpreted this ECG.  Initial EKG shows left bundle branch block rate in the 80s, some ST elevations in V2 and V3, widened QRS.  Repeat EKG narrow QRS, sinus rate of 73, ST elevation V3 V4 with T wave inversion in V2 and V6.,  Normal sinus ____________________________________________   PROCEDURES  Procedure(s) performed (including Critical Care):  Procedures   ____________________________________________   INITIAL IMPRESSION / ASSESSMENT AND PLAN / ED COURSE   Tromaine Balash was evaluated in Emergency Department on 03/18/2019 for the symptoms described in the history of present illness. He was evaluated in the context of the global COVID-19 pandemic, which necessitated consideration that the patient might be at risk for infection with the SARS-CoV-2 virus that causes COVID-19. Institutional protocols and algorithms that pertain to the evaluation of patients at risk for COVID-19 are in a state of rapid change based on information released by regulatory bodies including the CDC and federal and state  organizations. These policies and algorithms were followed during the patient's care in the ED.    Most Likely DDx:  -ACS   DDx that was also considered d/t potential to cause harm, but was found less likely based on history and physical (as detailed above): -PNA (no fevers, cough but CXR to evaluate) -PNX (reassured with equal b/l breath sounds, CXR to evaluate) -Symptomatic anemia (will get H&H) -Pulmonary embolism as no sob at rest, not pleuritic in nature, no hypoxia -Aortic Dissection as no tearing pain and no radiation to the mid back, pulses equal -Pericarditis no rub on exam, EKG changes or hx to suggest dx -Tamponade (no notable SOB, tachycardic, hypotensive) -Esophageal rupture (no h/o diffuse vomitting/no crepitus)  Dr. Saunders Revel at bedside.  This concerning for STEMI.  Patient admits to being off of his Brilinta for 1 week due to running out.  Patient will be sent to the Cath Lab     ____________________________________________   FINAL CLINICAL IMPRESSION(S) / ED DIAGNOSES   Final diagnoses:  ST elevation myocardial infarction (STEMI), unspecified artery ([place])     MEDICATIONS GIVEN DURING THIS VISIT:  Medications  heparin injection (8,000 Units Intravenous Given 03/18/19 2045)  midazolam (VERSED) injection (0.5 mg Intravenous Given 03/18/19 2056)  fentaNYL (SUBLIMAZE) injection (12.5 mcg Intravenous Given 03/18/19 2056)  ticagrelor (BRILINTA) tablet (180 mg Oral Given 03/18/19 2058)  atropine 1 MG/10ML injection (has no administration in time range)  verapamil (ISOPTIN) 2.5 MG/ML injection (has no administration in time range)  Heparin (Porcine) in NaCl 1000-0.9 UT/500ML-% SOLN (has no administration in time range)  heparin 1000 UNIT/ML injection (has no administration in time range)  midazolam (VERSED) 2 MG/2ML injection (has no administration in time range)  fentaNYL (SUBLIMAZE) 100 MCG/2ML injection (has no administration in time range)  ticagrelor (BRILINTA) 90  MG tablet (has no administration in time range)     ED Discharge Orders    None       Note:  This document was prepared using Dragon voice recognition software and may include unintentional dictation errors.   Vanessa [place], MD 03/18/19 2103

## 2019-03-18 NOTE — H&P (Signed)
[facility] [place] NAME: Nicholas Schultz    MR#:  [id number]  DATE OF BIRTH:  1945/12/10  DATE OF ADMISSION:  03/18/2019  PRIMARY CARE PHYSICIAN: Patient, [name] Per   REQUESTING/REFERRING PHYSICIAN: End, MD  CHIEF COMPLAINT:   Chief Complaint  Patient presents with  . Chest Pain    HISTORY OF PRESENT ILLNESS:  Nicholas Schultz  is a 73 y.o. male who presents with chief complaint as above.  Patient presented to the ED with a complaint of chest pain.  He states that this pain developed after he had mowed his lawn today.  He has a known history of CAD with prior stents.  He recently had stopped taking his Brilinta.  He was found to have STEMI tonight.  He was taken to the Cath Lab and had thrombus cleared from his LAD stent, and was restented.  He was admitted to the ICU and hospitalist were called for this admission  PAST MEDICAL HISTORY:   Past Medical History:  Diagnosis Date  . CAD (coronary artery disease)    a. s/p prior MI and stenting x 4 in [place], [place]; b. 06/2018 Ant STEMI/PCI: LM mod dzs, LAD 50ost, 99p (2.75x16 Synergy DES), 30p/m ISR, LCX nl, OM1 90, OM2 mod dzs, RCA large, 89m ISR, RPDA 70ost, RPAV 50.  [name] HFrEF (heart failure with reduced ejection fraction) ([place])    a. 06/2018 Echo: EF 30-35%.  . Ischemic cardiomyopathy    a. 06/2018 Echo: EF 30-35%, apical septal, mid and apical lateral, apical, apical inf, mid and apical ant, and mid antsept AK w/ inflat HK.   . Tobacco abuse    a. 1 cigar/day. Prev smoked 3ppd cigarettes.     PAST SURGICAL HISTORY:   Past Surgical History:  Procedure Laterality Date  . CORONARY ANGIOPLASTY WITH STENT PLACEMENT    . CORONARY/GRAFT ACUTE MI REVASCULARIZATION N/A 07/16/2018   Procedure: Coronary/Graft Acute MI Revascularization;  Surgeon: Nelva Bush, MD;  Location: [facility];  Service: Cardiovascular;  Laterality: N/A;  . IABP INSERTION N/A 07/16/2018   Procedure: IABP  Insertion;  Surgeon: Nelva Bush, MD;  Location: [facility];  Service: Cardiovascular;  Laterality: N/A;  . LEFT HEART CATH AND CORONARY ANGIOGRAPHY N/A 07/16/2018   Procedure: LEFT HEART CATH AND CORONARY ANGIOGRAPHY;  Surgeon: Nelva Bush, MD;  Location: [facility];  Service: Cardiovascular;  Laterality: N/A;     SOCIAL HISTORY:   Social History   Tobacco Use  . Smoking status: Current Every Day Smoker    Types: Cigars  . Smokeless tobacco: Never Used  Substance Use Topics  . Alcohol use: Yes    Comment: rare     FAMILY HISTORY:    Family history reviewed and is non-contributory DRUG ALLERGIES:  No Known Allergies  MEDICATIONS AT HOME:   Prior to Admission medications   Medication Sig Start Date End Date Taking? Authorizing Provider  aspirin EC 81 MG tablet Take 1 tablet (81 mg total) by mouth daily. 07/19/18   Lyda Jester M, PA-C  atorvastatin (LIPITOR) 80 MG tablet TAKE 1 TABLET BY MOUTH EVERY DAY AT 6 PM 11/21/18   End, Harrell Gave, MD  flavoxATE (URISPAS) 100 MG tablet Take 100 mg by mouth as needed (urinary frequency).    [provider]  metoprolol succinate (TOPROL XL) 25 MG 24 hr tablet Take 0.5 tablets (12.5 mg total) by mouth daily. 09/11/18   Nelva Bush, MD  nitroGLYCERIN (NITROSTAT) 0.4 MG SL tablet Place 1 tablet (0.4 mg total) under the tongue every 5 (five) minutes x 3 doses as needed for chest pain. 07/19/18   Consuelo Pandy, PA-C  ticagrelor (BRILINTA) 90 MG TABS tablet Take 1 tablet (90 mg total) by mouth 2 (two) times daily. 07/19/18   Consuelo Pandy, PA-C    REVIEW OF SYSTEMS:  Review of Systems  Constitutional: Negative for chills, fever, malaise/fatigue and weight loss.  HENT: Negative for ear pain, hearing loss and tinnitus.   Eyes: Negative for blurred vision, double vision, pain and redness.  Respiratory: Negative for cough, hemoptysis and shortness of breath.   Cardiovascular: Positive  for chest pain. Negative for palpitations, orthopnea and leg swelling.  Gastrointestinal: Negative for abdominal pain, constipation, diarrhea, nausea and vomiting.  Genitourinary: Negative for dysuria, frequency and hematuria.  Musculoskeletal: Negative for back pain, joint pain and neck pain.  Skin:       No acne, rash, or lesions  Neurological: Negative for dizziness, tremors, focal weakness and weakness.  Endo/Heme/Allergies: Negative for polydipsia. Does not bruise/bleed easily.  Psychiatric/Behavioral: Negative for depression. The patient is not nervous/anxious and does not have insomnia.      VITAL SIGNS:   Vitals:   03/18/19 2015 03/18/19 2016 03/18/19 2220  BP: 123/87    Pulse: 74    Resp: 14    Temp: 98.1 F (36.7 C)  98.2 F (36.8 C)  TempSrc: Oral  Oral  SpO2: 98%    Weight:  82.5 kg    Wt Readings from Last 3 Encounters:  03/18/19 82.5 kg  12/13/18 81.4 kg  10/18/18 80.3 kg    PHYSICAL EXAMINATION:  Physical Exam  Vitals reviewed. Constitutional: He is oriented to person, place, and time. He appears well-developed and well-nourished. No distress.  HENT:  Head: Normocephalic and atraumatic.  Mouth/Throat: Oropharynx is clear and moist.  Eyes: Pupils are equal, round, and reactive to light. Conjunctivae and EOM are normal. No scleral icterus.  Neck: Normal range of motion. Neck supple. No JVD present. No thyromegaly present.  Cardiovascular: Normal rate, regular rhythm and intact distal pulses. Exam reveals no gallop and no friction rub.  No murmur heard. Respiratory: Effort normal and breath sounds normal. No respiratory distress. He has no wheezes. He has no rales.  GI: Soft. Bowel sounds are normal. He exhibits no distension. There is no abdominal tenderness.  Musculoskeletal: Normal range of motion.        General: No edema.     Comments: No arthritis, no gout  Lymphadenopathy:    He has no cervical adenopathy.  Neurological: He is alert and oriented to  person, place, and time. No cranial nerve deficit.  No dysarthria, no aphasia  Skin: Skin is warm and dry. No rash noted. No erythema.  Psychiatric: He has a normal mood and affect. His behavior is normal. Judgment and thought content normal.    LABORATORY PANEL:   CBC Recent Labs  Lab 03/18/19 2016  WBC 17.8*  HGB 17.3*  HCT 51.4  PLT 217   ------------------------------------------------------------------------------------------------------------------  Chemistries  Recent Labs  Lab 03/18/19 2016  NA 139  K 4.5  CL 100  CO2 25  GLUCOSE 119*  BUN 20  CREATININE 1.69*  CALCIUM 9.3  AST 91*  ALT 31  ALKPHOS 91  BILITOT 1.1   ------------------------------------------------------------------------------------------------------------------  Cardiac Enzymes No results for input(s): TROPONINI in the last 168 hours. ------------------------------------------------------------------------------------------------------------------  RADIOLOGY:  No results found.  EKG:  Orders placed or performed in visit on 12/13/18  . EKG 12-Lead    IMPRESSION AND PLAN:  Principal Problem:   STEMI involving left anterior descending coronary artery ([place]) -status post cath with restenting.  Continue treatment in ICU for tonight per cardiology recommendations and orders.  They are following along for further treatment of this problem Active Problems:   CAD S/P percutaneous coronary angioplasty prox LAD -continue home meds, see above for further plan   Chronic systolic CHF (congestive heart failure) ([place]) -continue home medications, see above   Hyperlipidemia LDL goal <70 -home dose antilipid  Chart review performed and case discussed with ED provider. Labs, imaging and/or ECG reviewed by provider and discussed with patient/family. Management plans discussed with the patient and/or family.  COVID-19 status: Tested negative     DVT PROPHYLAXIS: Systemic anticoagulation  GI  PROPHYLAXIS:  None  ADMISSION STATUS: Inpatient     CODE STATUS: Full Code Status History    Date Active Date Inactive Code Status Order ID Comments User Context   07/17/2018 0257 07/19/2018 1559 Full Code [id number]  Marykay Lex, MD Inpatient   Advance Care Planning Activity      TOTAL CRITICAL CARE TIME TAKING CARE OF THIS PATIENT: 50 minutes.   This patient was evaluated in the context of the global COVID-19 pandemic, which necessitated consideration that the patient might be at risk for infection with the SARS-CoV-2 virus that causes COVID-19. Institutional protocols and algorithms that pertain to the evaluation of patients at risk for COVID-19 are in a state of rapid change based on information released by regulatory bodies including the CDC and federal and state organizations. These policies and algorithms were followed to the best of this provider's knowledge to date during the patient's care at this facility.  Ethlyn Daniels 03/18/2019, 11:28 PM  [facility]  Office  [phone]  CC: Primary care physician; Patient, [name] Per  Note:  This document was prepared using Dragon voice recognition software and may include unintentional dictation errors.

## 2019-03-18 NOTE — ED Notes (Addendum)
Pt taken to the cath lab with Cardiologist Dr. Saunders Revel at bedside

## 2019-03-18 NOTE — Progress Notes (Signed)
eLink Physician-Brief Progress Note Patient Name: Nicholas Schultz DOB: August 05, 1945 MRN: [id number]   Date of Service  03/18/2019  HPI/Events of Note  70 M tobacco abuse history of HFrEF 25-30%,  CAD s/p stents most recently last Feb with PCI to the proximal LAD, dyslipidemia. Presents with chest pain . Initial EKG LBBB and underwent emergent LHC with non occlusive thrombus in the proximal LAD which underwent angioplasty and stent placed from the ostial LAD through the proximal stented segment.  eICU Interventions   Currently chest pain free on Aggrastat. Patient admits to not having his Briinta filled the past week but he continued to take aspirin.  Smoking cessation, lifestyle change and medication compliance counseling done     Intervention Category Evaluation Type: New Patient Evaluation  Judd Lien 03/18/2019, 11:17 PM

## 2019-03-19 ENCOUNTER — Encounter: Payer: Self-pay | Admitting: Internal Medicine

## 2019-03-19 ENCOUNTER — Inpatient Hospital Stay (HOSPITAL_COMMUNITY)
Admit: 2019-03-19 | Discharge: 2019-03-19 | Disposition: A | Discharge status: Home / Self Care | Payer: Medicare HMO | Attending: Internal Medicine | Admitting: Internal Medicine

## 2019-03-19 DIAGNOSIS — I2102 ST elevation (STEMI) myocardial infarction involving left anterior descending coronary artery: Secondary | ICD-10-CM

## 2019-03-19 DIAGNOSIS — I213 ST elevation (STEMI) myocardial infarction of unspecified site: Secondary | ICD-10-CM

## 2019-03-19 LAB — MRSA PCR SCREENING: MRSA by PCR: NEGATIVE

## 2019-03-19 LAB — TROPONIN I (HIGH SENSITIVITY)
Troponin I (High Sensitivity): >27000 ng/L (ref <18)
Troponin I (High Sensitivity): >27000 ng/L (ref <18)
Troponin I (High Sensitivity): >27000 ng/L (ref <18)
Troponin I (High Sensitivity): >27000 ng/L (ref <18)
Troponin I (High Sensitivity): >27000 ng/L (ref <18)

## 2019-03-19 LAB — CBC
HCT: 51.7 % (ref 39.0–52.0)
Hemoglobin: 17.3 g/dL — ABNORMAL HIGH (ref 13.0–17.0)
MCH: 30.4 pg (ref 26.0–34.0)
MCHC: 33.5 g/dL (ref 30.0–36.0)
MCV: 90.7 fL (ref 80.0–100.0)
Platelets: 194 10*3/uL (ref 150–400)
RBC: 5.7 MIL/uL (ref 4.22–5.81)
RDW: 14.3 % (ref 11.5–15.5)
WBC: 14.8 10*3/uL — ABNORMAL HIGH (ref 4.0–10.5)
nRBC: 0 % (ref 0.0–0.2)

## 2019-03-19 LAB — BASIC METABOLIC PANEL
Anion gap: 11 (ref 5–15)
BUN: 20 mg/dL (ref 8–23)
CO2: 22 mmol/L (ref 22–32)
Calcium: 8.6 mg/dL — ABNORMAL LOW (ref 8.9–10.3)
Chloride: 105 mmol/L (ref 98–111)
Creatinine, Ser: 1.44 mg/dL — ABNORMAL HIGH (ref 0.61–1.24)
GFR calc Af Amer: 55 mL/min — ABNORMAL LOW (ref >60)
GFR calc non Af Amer: 47 mL/min — ABNORMAL LOW (ref >60)
Glucose, Bld: 103 mg/dL — ABNORMAL HIGH (ref 70–99)
Potassium: 4.2 mmol/L (ref 3.5–5.1)
Sodium: 138 mmol/L (ref 135–145)

## 2019-03-19 LAB — ECHOCARDIOGRAM COMPLETE
Height: 70 in
Weight: 2910.4 oz

## 2019-03-19 MED ORDER — TIROFIBAN HCL IN NACL 5-0.9 MG/100ML-% IV SOLN
0.0750 ug/kg/min | INTRAVENOUS | Status: DC
  Administered 2019-03-19: 0.075 ug/kg/min via INTRAVENOUS
  Filled 2019-03-19 (×2): qty 100

## 2019-03-19 MED ORDER — NITROGLYCERIN 0.4 MG SL SUBL
SUBLINGUAL_TABLET | SUBLINGUAL | Status: AC
  Filled 2019-03-19: qty 1

## 2019-03-19 MED ORDER — FUROSEMIDE 10 MG/ML IJ SOLN
20.0000 mg | Freq: Once | INTRAMUSCULAR | Status: AC
  Administered 2019-03-19: 20 mg via INTRAVENOUS
  Filled 2019-03-19: qty 2

## 2019-03-19 MED ORDER — CHLORHEXIDINE GLUCONATE CLOTH 2 % EX PADS
6.0000 | MEDICATED_PAD | Freq: Every day | CUTANEOUS | Status: DC
  Administered 2019-03-19: 6 via TOPICAL

## 2019-03-19 MED ORDER — PERFLUTREN LIPID MICROSPHERE
1.0000 mL | INTRAVENOUS | Status: AC | PRN
  Administered 2019-03-19: 10:00:00 2 mL via INTRAVENOUS
  Filled 2019-03-19: qty 10

## 2019-03-19 MED ORDER — NITROGLYCERIN 0.4 MG SL SUBL
0.4000 mg | SUBLINGUAL_TABLET | SUBLINGUAL | Status: DC | PRN
  Administered 2019-03-19: 0.4 mg via SUBLINGUAL

## 2019-03-19 NOTE — Progress Notes (Signed)
Arrived in patient's room because bed alarm with alarming. Patient is impulsive and confused. Patient stated he had a fever and was short of breath. Both are symptoms he has when having a heart attack. No complaints of chest pain. Ordered EKG and administered 4mg  of nitro. Temperature was wdl. Patient's forehead was noted to has some beads of sweat.

## 2019-03-19 NOTE — Progress Notes (Signed)
[place] [facility] [place] NAME: Tood Jaracz    MR#:  [id number]  DATE OF BIRTH:  1945-11-14  SUBJECTIVE:   Patient states he feels great this morning. He denies CP or SOB. He is asking to go home.  REVIEW OF SYSTEMS:  Review of Systems  Constitutional: Negative for chills and fever.  HENT: Negative for congestion and sore throat.   Eyes: Negative for blurred vision and double vision.  Respiratory: Negative for cough and shortness of breath.   Cardiovascular: Negative for chest pain and palpitations.  Gastrointestinal: Negative for nausea and vomiting.  Genitourinary: Negative for dysuria and urgency.  Musculoskeletal: Negative for back pain and neck pain.  Neurological: Negative for dizziness and headaches.  Psychiatric/Behavioral: Negative for depression. The patient is not nervous/anxious.     DRUG ALLERGIES:  No Known Allergies VITALS:  Blood pressure 101/64, pulse 77, temperature 98.4 F (36.9 C), temperature source Oral, resp. rate (!) 24, height 5\' 10"  (1.778 m), weight 82.5 kg, SpO2 95 %. PHYSICAL EXAMINATION:  Physical Exam  GENERAL:  Laying in the bed with no acute distress.  HEENT: Head atraumatic, normocephalic. Pupils equal, round, reactive to light and accommodation. No scleral icterus. Extraocular muscles intact. Oropharynx and nasopharynx clear.  NECK:  Supple, no jugular venous distention. No thyroid enlargement. LUNGS: Lungs are clear to auscultation bilaterally. No wheezes, crackles, rhonchi. No use of accessory muscles of respiration.  CARDIOVASCULAR: RRR, S1, S2 normal. No murmurs, rubs, or gallops.  ABDOMEN: Soft, nontender, nondistended. Bowel sounds present.  EXTREMITIES: No pedal edema, cyanosis, or clubbing.  NEUROLOGIC: CN 2-12 intact, no focal deficits. 5/5 muscle strength throughout all extremities. Sensation intact throughout. Gait not checked.  PSYCHIATRIC: The patient is alert and oriented x 3.  SKIN: No  obvious rash, lesion, or ulcer.  LABORATORY PANEL:  Male CBC Recent Labs  Lab 03/19/19 0347  WBC 14.8*  HGB 17.3*  HCT 51.7  PLT 194   ------------------------------------------------------------------------------------------------------------------ Chemistries  Recent Labs  Lab 03/18/19 2016 03/19/19 0347  NA 139 138  K 4.5 4.2  CL 100 105  CO2 25 22  GLUCOSE 119* 103*  BUN 20 20  CREATININE 1.69* 1.44*  CALCIUM 9.3 8.6*  AST 91*  --   ALT 31  --   ALKPHOS 91  --   BILITOT 1.1  --    RADIOLOGY:  No results found. ASSESSMENT AND PLAN:   Anterior STEMI- likely due to stopping Brilinta 3 weeks ago. -s/p cardiac cath 10/27 with stent thrombosis of the proximal LAD; new DES placed to the LAD -Cardiology following -Continue aspirin and brilinta -ECHO pending -Continue statin -Continue metoprolol  Leukocytosis- likely reactive due to above. No signs of infection. -Recheck CBC in the morning  Chronic systolic CHF -Last EF was 25-30% -Received lasix 20mg  IV x 1 yesterday -Repeat ECHO ordered -Continue metoprolol  Hyperlipidemia -Continue home statin  CKD III- creatinine at baseline -Avoid nephrotoxic agents -Monitor  All the records are reviewed and case discussed with Care Management/Social Worker. Management plans discussed with the patient, family and they are in agreement.  CODE STATUS: Full Code  TOTAL TIME TAKING CARE OF THIS PATIENT: 40 minutes.   More than 50% of the time was spent in counseling/coordination of care: YES  POSSIBLE D/C IN 1-2 DAYS, DEPENDING ON CLINICAL CONDITION.   Berna Spare Corleen Otwell M.D on 03/19/2019 at 2:56 PM  Between 7am to 6pm - Pager [phone]  After 6pm go to [email or web address] -  password Airline pilot  [facility]  Office  [phone]  CC: Primary care physician; Patient, [name] Per  Note: This dictation was prepared with Dragon dictation along with smaller phrase technology. Any  transcriptional errors that result from this process are unintentional.

## 2019-03-19 NOTE — Progress Notes (Signed)
CARE PLAN  Nicholas Schultz is a 73 year old male with a past medical history notable for coronary artery disease status post multiple MIs (most recently 06/2018 with PCI to the proximal LAD), chronic systolic CHF (EF 25 - [id number] in 10/2018), hyperlipidemia, and tobacco abuse who presented to [facility] ED on 03/18/2019 with complaints of sudden onset of substernal chest pain.  EKG in the ED revealed ST segment elevation in the anterior leads.  He was taken emergently for cardiac catheterization by Dr. Saunders Revel.  He does report that he ran out of his Brilinta approximately a week ago.  Cardiac catheterization revealed nonocclusive thrombus in the proximal LAD stent, which was treated with balloon angioplasty and new stent placed from the ostial LAD through the proximal stented segment.  He returns to ICU post cardiac catheterization.  He is chest pain-free, denies shortness of breath,  is hemodynamically stable (Temperature 98.1, RR 15, NSR, BP 100/75, SpO2 93% on room air), and in no acute distress.  PCCM is not officially consulted, however is available for any critical care needs.    SYNOPSIS/PLAN  Anterior STEMI  Chronic Systolic CHF (EF [id number] in 10/2018) Hyperlipidemia -Continuous cardiac monitoring -Maintain MAP >65 -S/p Cardiac catheterization with balloon angioplasty and placement of DES in the proximal & Mid LAD -Continue Tirofiban drip as per Cardiology -Dual antiplatelet therapy with Aspirin and Brilinta -Echocardiogram pending -IV Lasix as blood pressure and renal function permits ~patient is set to receive 20 mg IV Lasix x1 dose -Continue metoprolol as per cardiology recommendations -Statin therapy (Atorvastatin 80 mg daily) -Encourage tobacco cessation   AKI -Monitor I&O's / urinary output -Follow BMP -Ensure adequate renal perfusion -Avoid nephrotoxic agents as able -Replace electrolytes as indicated       Darel Hong, AGACNP-BC [facility] Pager:  [phone]

## 2019-03-19 NOTE — Progress Notes (Signed)
Patient has increased PVC with HR as low as 38 beats, as well as a 7 beat run of V-tach.  Dr. Saunders Revel was notified.   At [place], patient refused lasix as he did not want to be disturbed to use the restroom.  A condom cath was offered, but he declined.   Additionally, 2L nasal cannula was placed.

## 2019-03-19 NOTE — Plan of Care (Signed)

## 2019-03-19 NOTE — Progress Notes (Signed)
Progress Note  Patient Name: Nicholas Schultz Date of Encounter: 03/19/2019  Primary Cardiologist: Nelva Bush, MD   Subjective   Patient seen this morning getting an echocardiogram.  He denies chest pain or shortness of breath.  Inpatient Medications    Scheduled Meds: . aspirin EC  81 mg Oral Daily  . atorvastatin  80 mg Oral q1800  . Chlorhexidine Gluconate Cloth  6 each Topical Daily  . heparin  5,000 Units Subcutaneous Q8H  . metoprolol succinate  12.5 mg Oral Daily  . sodium chloride flush  3 mL Intravenous Q12H  . ticagrelor  90 mg Oral BID   Continuous Infusions: . sodium chloride     PRN Meds: sodium chloride, acetaminophen, ondansetron (ZOFRAN) IV, sodium chloride flush   Vital Signs    Vitals:   03/19/19 0400 03/19/19 0500 03/19/19 0600 03/19/19 0700  BP: 108/75 103/75 102/76 99/72  Pulse: 71 69 69 72  Resp: 17 18 17 15   Temp: 98 F (36.7 C)     TempSrc: Oral     SpO2: 90% 90% (!) 89% 92%  Weight:      Height:        Intake/Output Summary (Last 24 hours) at 03/19/2019 1058 Last data filed at 03/19/2019 0924 Gross per 24 hour  Intake 83.39 ml  Output 250 ml  Net -166.61 ml   Last 3 Weights 03/18/2019 12/13/2018 10/18/2018  Weight (lbs) 181 lb 14.4 oz 179 lb 8 oz 177 lb  Weight (kg) 82.509 kg 81.421 kg 80.287 kg      Telemetry    Sinus rhythm- Personally Reviewed  ECG    Not obtained today  Physical Exam   GEN: No acute distress.   Neck: No JVD Cardiac: RRR, no murmurs, rubs, or gallops.  Respiratory: Clear to auscultation bilaterally. GI: Soft, nontender, non-distended  MS: No edema; No deformity. Neuro:  Nonfocal  Psych: Normal affect   Labs    High Sensitivity Troponin:   Recent Labs  Lab 03/18/19 2016 03/18/19 2233 03/19/19 0014 03/19/19 0546  TROPONINIHS 4,890* >27,000* >27,000* >27,000*      Chemistry Recent Labs  Lab 03/18/19 2016 03/19/19 0347  NA 139 138  K 4.5 4.2  CL 100 105  CO2 25 22  GLUCOSE 119*  103*  BUN 20 20  CREATININE 1.69* 1.44*  CALCIUM 9.3 8.6*  PROT 8.0  --   ALBUMIN 4.4  --   AST 91*  --   ALT 31  --   ALKPHOS 91  --   BILITOT 1.1  --   GFRNONAA 39* 47*  GFRAA 45* 55*  ANIONGAP 14 11     Hematology Recent Labs  Lab 03/18/19 2016 03/19/19 0347  WBC 17.8* 14.8*  RBC 5.63 5.70  HGB 17.3* 17.3*  HCT 51.4 51.7  MCV 91.3 90.7  MCH 30.7 30.4  MCHC 33.7 33.5  RDW 14.3 14.3  PLT 217 194    BNPNo results for input(s): BNP, PROBNP in the last 168 hours.   DDimer No results for input(s): DDIMER in the last 168 hours.   Radiology    No results found.  Cardiac Studies  Conclusions: 1. Late stent thrombosis of the proximal LAD with 90% stenosis due to non-occlusive thrombus. 2. Otherwise, stable appearance of multivessel coronary artery disease compared with catheterization from 06/2018. 3. Severely elevated left ventricular filling pressure (LVEDP 35-40 mmHg). 4. Successful IVUS-guided PCI to ostial/proximal LAD using Synergy 3.5 x 8 mm drug-eluting stent (post-dilated to 4.2 mm)  with 0% residual stenosis and TIMI-3 flow.    Patient Profile     73 y.o. male with history of CAD/MI with PCI to LAD in 06/2018, ischemic cardiomyopathy last EF 25 to 30% who presents to the hospital due to chest pain.  Found to have an anterior ST elevated myocardial infarction.  Patient is now status post PCI to the proximal LAD with a drug eluding stent with no residual stenosis.  Assessment & Plan    Patient feels well, denies chest pain or shortness of breath.  He is an active smoker and also was not on Brilinta for about a week prior to his admission.  Currently getting echocardiogram.  CAD/STEMI Continue aspirin 81 mg, Brilinta 90bid, statin Lipitor 80 mg daily.  HFrEF We will review repeat echocardiogram. Continue Toprol 12.5 mg daily.  Blood pressure is on the low side. Patient does not want to start ACE or ARB's.  Tobacco use Cessation advised.    Signed,  Kate Sable, MD  03/19/2019, 10:58 AM

## 2019-03-19 NOTE — Progress Notes (Addendum)
Shift summary:  - TR band off at 0830.  - Aggrastat stopped at 0830.  - Ambulated 2 laps around unit with RN with no SOB or CP.  - Attempted to call report X 1 at 1445 hrs.

## 2019-03-19 NOTE — Progress Notes (Signed)
*  PRELIMINARY RESULTS* Echocardiogram 2D Echocardiogram has been performed. Definity image enhancer was administered via intravenous access.  Nicholas Schultz 03/19/2019, 10:14 AM

## 2019-03-19 NOTE — Plan of Care (Signed)
  Problem: Activity: Goal: Risk for activity intolerance will decrease Outcome: Progressing   Problem: Education: Goal: Knowledge of General Education information will improve Description: Including pain rating scale, medication(s)/side effects and non-pharmacologic comfort measures Outcome: Not Progressing Note: Patient has periods of confusion   Problem: Safety: Goal: Ability to remain free from injury will improve Outcome: Not Progressing Note: Patient is impulsive

## 2019-03-20 ENCOUNTER — Inpatient Hospital Stay: Payer: Medicare HMO

## 2019-03-20 ENCOUNTER — Other Ambulatory Visit: Payer: Self-pay | Admitting: Internal Medicine

## 2019-03-20 DIAGNOSIS — D72829 Elevated white blood cell count, unspecified: Secondary | ICD-10-CM

## 2019-03-20 DIAGNOSIS — I255 Ischemic cardiomyopathy: Secondary | ICD-10-CM

## 2019-03-20 LAB — BASIC METABOLIC PANEL
Anion gap: 11 (ref 5–15)
BUN: 23 mg/dL (ref 8–23)
CO2: 22 mmol/L (ref 22–32)
Calcium: 9.2 mg/dL (ref 8.9–10.3)
Chloride: 105 mmol/L (ref 98–111)
Creatinine, Ser: 1.31 mg/dL — ABNORMAL HIGH (ref 0.61–1.24)
GFR calc Af Amer: >60 mL/min (ref >60)
GFR calc non Af Amer: 53 mL/min — ABNORMAL LOW (ref >60)
Glucose, Bld: 118 mg/dL — ABNORMAL HIGH (ref 70–99)
Potassium: 4.3 mmol/L (ref 3.5–5.1)
Sodium: 138 mmol/L (ref 135–145)

## 2019-03-20 LAB — URINALYSIS, COMPLETE (UACMP) WITH MICROSCOPIC
Bacteria, UA: NONE SEEN
Bilirubin Urine: NEGATIVE
Glucose, UA: NEGATIVE mg/dL
Ketones, ur: 5 mg/dL — AB
Leukocytes,Ua: NEGATIVE
Nitrite: NEGATIVE
Protein, ur: NEGATIVE mg/dL
Specific Gravity, Urine: 1.025 (ref 1.005–1.030)
pH: 5 (ref 5.0–8.0)

## 2019-03-20 LAB — CBC
HCT: 52.9 % — ABNORMAL HIGH (ref 39.0–52.0)
Hemoglobin: 18.1 g/dL — ABNORMAL HIGH (ref 13.0–17.0)
MCH: 30.4 pg (ref 26.0–34.0)
MCHC: 34.2 g/dL (ref 30.0–36.0)
MCV: 88.9 fL (ref 80.0–100.0)
Platelets: 199 10*3/uL (ref 150–400)
RBC: 5.95 MIL/uL — ABNORMAL HIGH (ref 4.22–5.81)
RDW: 14.1 % (ref 11.5–15.5)
WBC: 18.8 10*3/uL — ABNORMAL HIGH (ref 4.0–10.5)
nRBC: 0 % (ref 0.0–0.2)

## 2019-03-20 LAB — PROCALCITONIN: Procalcitonin: <0.1 ng/mL

## 2019-03-20 MED ORDER — DOXYCYCLINE HYCLATE 100 MG PO TABS
100.0000 mg | ORAL_TABLET | Freq: Two times a day (BID) | ORAL | Status: DC
  Filled 2019-03-20: qty 1

## 2019-03-20 MED ORDER — TICAGRELOR 90 MG PO TABS: 90.0000 mg | ORAL_TABLET | Freq: Two times a day (BID) | ORAL | 0 refills | Status: DC

## 2019-03-20 MED ORDER — LOSARTAN POTASSIUM 25 MG PO TABS: 12.5000 mg | ORAL_TABLET | Freq: Every day | ORAL | 0 refills | Status: DC

## 2019-03-20 MED ORDER — POTASSIUM CHLORIDE ER 10 MEQ PO TBCR: 10.0000 meq | EXTENDED_RELEASE_TABLET | Freq: Every day | ORAL | 0 refills | Status: DC

## 2019-03-20 MED ORDER — FUROSEMIDE 40 MG PO TABS: 40.0000 mg | ORAL_TABLET | Freq: Every day | ORAL | 0 refills | Status: DC

## 2019-03-20 MED ORDER — DOXYCYCLINE HYCLATE 100 MG PO TABS: 100.0000 mg | ORAL_TABLET | Freq: Two times a day (BID) | ORAL | 0 refills | Status: DC

## 2019-03-20 NOTE — Discharge Summary (Addendum)
[facility] [place] NAME: Nicholas Schultz    MR#:  [id number]  DATE OF BIRTH:  [birth year]  DATE OF ADMISSION:  03/18/2019   ADMITTING PHYSICIAN: Nelva Bush, MD  DATE OF DISCHARGE: 03/20/19  PRIMARY CARE PHYSICIAN: Patient, [name] Per   ADMISSION DIAGNOSIS:  STEMI involving left anterior descending coronary artery (HCC) [I21.02] DISCHARGE DIAGNOSIS:  Principal Problem:   STEMI involving left anterior descending coronary artery (HCC) Active Problems:   CAD S/P percutaneous coronary angioplasty prox LAD   Hyperlipidemia LDL goal [id number]   Chronic systolic CHF (congestive heart failure) ([place])  SECONDARY DIAGNOSIS:   Past Medical History:  Diagnosis Date  . CAD (coronary artery disease)    a. s/p prior MI and stenting x 4 in [place], [place]; b. 06/2018 Ant STEMI/PCI: LM mod dzs, LAD 50ost, 99p (2.75x16 Synergy DES), 30p/m ISR, LCX nl, OM1 90, OM2 mod dzs, RCA large, 51m ISR, RPDA 70ost, RPAV 50.  [name] HFrEF (heart failure with reduced ejection fraction) ([place])    a. 06/2018 Echo: EF 30-35%.  . Ischemic cardiomyopathy    a. 06/2018 Echo: EF 30-35%, apical septal, mid and apical lateral, apical, apical inf, mid and apical ant, and mid antsept AK w/ inflat HK.   . Tobacco abuse    a. 1 cigar/day. Prev smoked 3ppd cigarettes.   HOSPITAL COURSE:   Nicholas Schultz is a 73 year old male who presented to the ED with chest pain that started when he was mowing his lawn.  In the ED, he was found to have a STEMI.  He was taken emergently to the Cath Lab and was found to have a thrombus in a previous LAD stent.  This area was restented and he was admitted to the ICU.  Anterior STEMI- likely due to stopping Brilinta 3 weeks ago. -s/p cardiac cath 10/27 with stent thrombosis of the proximal LAD; new DES placed to the LAD -Continued aspirin and brilinta- patient will need this for the rest of his lieft -Continued statin -Continue metoprolol -Started on losartan  12.5mg  daily -Follow-up with cardiology in 1-2 weeks  Community-acquired pneumonia- patient had shortness of breath on the day prior to discharge.  Chest x-ray showed possible new infiltrate. -Patient was discharged home with doxycycline twice daily for a total 7-day course -Patient was able to ambulate with pulse ox on the day of discharge and maintain O2 sats > 95%  Chronic combined systolic and diastolic CHF -ECHO this admission with EF was 25-30% and grade 2 diastolic dysfunction -Received a couple of doses of IV Lasix during this hospitalization -Prescribed Lasix 40 mg p.o. daily on discharge -Follow-up with cardiology in 1-2 weeks  Hyperlipidemia -Continued home statin  CKD IIIa- creatinine at baseline -Recheck BMP as an outpatient  DISCHARGE CONDITIONS:  Anterior STEMI Community-acquired pneumonia Chronic combined systolic and diastolic congestive heart failure Hyperlipidemia CKD IIIa CONSULTS OBTAINED:  Treatment Team:  Nelva Bush, MD DRUG ALLERGIES:  No Known Allergies DISCHARGE MEDICATIONS:   Allergies as of 03/20/2019   No Known Allergies     Medication List    TAKE these medications   aspirin EC 81 MG tablet Take 1 tablet (81 mg total) by mouth daily.   atorvastatin 80 MG tablet Commonly known as: LIPITOR TAKE 1 TABLET BY MOUTH EVERY DAY AT 6 PM   doxycycline 100 MG tablet Commonly known as: VIBRA-TABS Take 1 tablet (100 mg total) by mouth every 12 (twelve) hours.   flavoxATE 100 MG tablet  Commonly known as: URISPAS Take 100 mg by mouth as needed (urinary frequency).   furosemide 40 MG tablet Commonly known as: Lasix Take 1 tablet (40 mg total) by mouth daily.   losartan 25 MG tablet Commonly known as: COZAAR Take 0.5 tablets (12.5 mg total) by mouth daily.   metoprolol succinate 25 MG 24 hr tablet Commonly known as: Toprol XL Take 0.5 tablets (12.5 mg total) by mouth daily.   nitroGLYCERIN 0.4 MG SL tablet Commonly known as:  NITROSTAT Place 1 tablet (0.4 mg total) under the tongue every 5 (five) minutes x 3 doses as needed for chest pain.   potassium chloride 10 MEQ tablet Commonly known as: KLOR-CON Take 1 tablet (10 mEq total) by mouth daily.   ticagrelor 90 MG Tabs tablet Commonly known as: BRILINTA Take 1 tablet (90 mg total) by mouth 2 (two) times daily.        DISCHARGE INSTRUCTIONS:  1.  Follow-up with PCP in 5 days 2.  Follow-up with cardiology in 1 week 3.  Take Lasix 40 mg p.o. daily 4.  Take aspirin and Brilinta as prescribed 5.  Take doxycycline twice daily for 7 days 6.  Started on losartan 12.5 mg daily 7.  Needs BMP rechecked as an outpatient DIET:  Cardiac diet DISCHARGE CONDITION:  Stable ACTIVITY:  Activity as tolerated OXYGEN:  Home Oxygen: No.  Oxygen Delivery: room air DISCHARGE LOCATION:  home   If you experience worsening of your admission symptoms, develop shortness of breath, life threatening emergency, suicidal or homicidal thoughts you must seek medical attention immediately by calling 911 or calling your MD immediately  if symptoms less severe.  You Must read complete instructions/literature along with all the possible adverse reactions/side effects for all the Medicines you take and that have been prescribed to you. Take any new Medicines after you have completely understood and accpet all the possible adverse reactions/side effects.   Please note  You were cared for by a hospitalist during your hospital stay. If you have any questions about your discharge medications or the care you received while you were in the hospital after you are discharged, you can call the unit and asked to speak with the hospitalist on call if the hospitalist that took care of you is not available. Once you are discharged, your primary care physician will handle any further medical issues. Please note that NO REFILLS for any discharge medications will be authorized once you are discharged,  as it is imperative that you return to your primary care physician (or establish a relationship with a primary care physician if you do not have one) for your aftercare needs so that they can reassess your need for medications and monitor your lab values.    On the day of Discharge:  VITAL SIGNS:  Blood pressure 123/86, pulse 94, temperature 98.2 F (36.8 C), temperature source Oral, resp. rate 19, height 5\' 10"  (1.778 m), weight 79.4 kg, SpO2 93 %. PHYSICAL EXAMINATION:  GENERAL:  73 y.o.-year-old patient lying in the bed with no acute distress.  Well-appearing. EYES: Pupils equal, round, reactive to light and accommodation. No scleral icterus. Extraocular muscles intact.  HEENT: Head atraumatic, normocephalic. Oropharynx and nasopharynx clear.  NECK:  Supple, no jugular venous distention. No thyroid enlargement, no tenderness.  LUNGS: Normal breath sounds bilaterally, no wheezing, rales,rhonchi or crepitation. No use of accessory muscles of respiration.  CARDIOVASCULAR: RRR, S1, S2 normal. No murmurs, rubs, or gallops.  ABDOMEN: Soft, non-tender, non-distended. Bowel sounds present.  No organomegaly or mass.  EXTREMITIES: No pedal edema, cyanosis, or clubbing.  NEUROLOGIC: Cranial nerves II through XII are intact. Muscle strength 5/5 in all extremities. Sensation intact. Gait not checked.  PSYCHIATRIC: The patient is alert and oriented x 3.  SKIN: No obvious rash, lesion, or ulcer.  DATA REVIEW:   CBC Recent Labs  Lab 03/20/19 0439  WBC 18.8*  HGB 18.1*  HCT 52.9*  PLT 199    Chemistries  Recent Labs  Lab 03/18/19 2016  03/20/19 0439  NA 139   < > 138  K 4.5   < > 4.3  CL 100   < > 105  CO2 25   < > 22  GLUCOSE 119*   < > 118*  BUN 20   < > 23  CREATININE 1.69*   < > 1.31*  CALCIUM 9.3   < > 9.2  AST 91*  --   --   ALT 31  --   --   ALKPHOS 91  --   --   BILITOT 1.1  --   --    < > = values in this interval not displayed.     Microbiology Results  Results for  orders placed or performed during the hospital encounter of 03/18/19  SARS Coronavirus 2 by RT PCR (hospital order, performed in [facility] hospital lab) Nasopharyngeal Nasopharyngeal Swab     Status: None   Collection Time: 03/18/19  8:17 PM   Specimen: Nasopharyngeal Swab  Result Value Ref Range Status   SARS Coronavirus 2 NEGATIVE NEGATIVE Final    Comment: (NOTE) If result is NEGATIVE SARS-CoV-2 target nucleic acids are NOT DETECTED. The SARS-CoV-2 RNA is generally detectable in upper and lower  respiratory specimens during the acute phase of infection. The lowest  concentration of SARS-CoV-2 viral copies this assay can detect is 250  copies / mL. A negative result does not preclude SARS-CoV-2 infection  and should not be used as the sole basis for treatment or other  patient management decisions.  A negative result may occur with  improper specimen collection / handling, submission of specimen other  than nasopharyngeal swab, presence of viral mutation(s) within the  areas targeted by this assay, and inadequate number of viral copies  (<250 copies / mL). A negative result must be combined with clinical  observations, patient history, and epidemiological information. If result is POSITIVE SARS-CoV-2 target nucleic acids are DETECTED. The SARS-CoV-2 RNA is generally detectable in upper and lower  respiratory specimens dur ing the acute phase of infection.  Positive  results are indicative of active infection with SARS-CoV-2.  Clinical  correlation with patient history and other diagnostic information is  necessary to determine patient infection status.  Positive results do  not rule out bacterial infection or co-infection with other viruses. If result is PRESUMPTIVE POSTIVE SARS-CoV-2 nucleic acids MAY BE PRESENT.   A presumptive positive result was obtained on the submitted specimen  and confirmed on repeat testing.  While 2019 novel coronavirus  (SARS-CoV-2) nucleic acids may  be present in the submitted sample  additional confirmatory testing may be necessary for epidemiological  and / or clinical management purposes  to differentiate between  SARS-CoV-2 and other Sarbecovirus currently known to infect humans.  If clinically indicated additional testing with an alternate test  methodology [phone]) is advised. The SARS-CoV-2 RNA is generally  detectable in upper and lower respiratory sp ecimens during the acute  phase of infection. The expected result is Negative.  Fact Sheet for Patients:  StrictlyIdeas.no Fact Sheet for Healthcare Providers: BankingDealers.co.za This test is not yet approved or cleared by the [place] FDA and has been authorized for detection and/or diagnosis of SARS-CoV-2 by FDA under an Emergency Use Authorization (EUA).  This EUA will remain in effect (meaning this test can be used) for the duration of the COVID-19 declaration under Section 564(b)(1) of the Act, 21 U.S.C. section 360bbb-3(b)(1), unless the authorization is terminated or revoked sooner. Performed at [facility], [address] [place], [place] [address]   MRSA PCR Screening     Status: None   Collection Time: 03/18/19 10:45 PM   Specimen: Nasopharyngeal  Result Value Ref Range Status   MRSA by PCR NEGATIVE NEGATIVE Final    Comment:        The GeneXpert MRSA Assay (FDA approved for NASAL specimens only), is one component of a comprehensive MRSA colonization surveillance program. It is not intended to diagnose MRSA infection nor to guide or monitor treatment for MRSA infections. Performed at [facility], [address] [place], [place] [address]     RADIOLOGY:  Dg Chest 1 View  Result Date: 03/20/2019 CLINICAL DATA:  Leukocytosis, smoker, history coronary artery disease post MI and coronary PTCA, ischemic cardiomyopathy, heart failure EXAM: CHEST  1 VIEW COMPARISON:  Portable exam  0749 hours compared to 07/17/2018 FINDINGS: Normal heart size and mediastinal contours. Pulmonary vascular congestion. Diffuse interstitial infiltrates asymmetrically greater on RIGHT. No pleural effusion or pneumothorax. Atherosclerotic calcification aorta. No acute osseous findings. IMPRESSION: Asymmetric interstitial infiltrates RIGHT greater than LEFT, question asymmetric pulmonary edema versus infection. Electronically Signed   By: Lavonia Dana M.D.   On: 03/20/2019 08:49     Management plans discussed with the patient, family and they are in agreement.  CODE STATUS: Full Code   TOTAL TIME TAKING CARE OF THIS PATIENT: 40 minutes.    Nicholas Schultz M.D on 03/20/2019 at 2:21 PM  Between 7am to 6pm - Pager [phone]  After 6pm go to [email or web address] - Proofreader  [facility]  Office  [phone]  CC: Primary care physician; Patient, [name] Per   Note: This dictation was prepared with Dragon dictation along with smaller phrase technology. Any transcriptional errors that result from this process are unintentional.

## 2019-03-20 NOTE — Progress Notes (Signed)
Cardiovascular and Pulmonary Nurse Navigator Note:    73 year old male who presented to the ED on the 03/18/2019 with chief complaint of chest pain.  PMHx significant for CAD / HFrEF, ischemic cardiomyopathy, and tobacco abuse. Patient ruled in for anterior STEMI.  Patient underwent emergent Cardiac Cath.    Coronary/Graft Acute MI Revascularization  LEFT HEART CATH AND CORONARY ANGIOGRAPHY  Conclusion  Conclusions: 1. Late stent thrombosis of the proximal LAD with 90% stenosis due to non-occlusive thrombus. 2. Otherwise, stable appearance of multivessel coronary artery disease compared with catheterization from 06/2018. 3. Severely elevated left ventricular filling pressure (LVEDP 35-40 mmHg). 4. Successful IVUS-guided PCI to ostial/proximal LAD using Synergy 3.5 x 8 mm drug-eluting stent (post-dilated to 4.2 mm) with 0% residual stenosis and TIMI-3 flow.  Recommendations: 1. Continue tirofiban infusion for 6 hours. 2. Indefinite dual antiplatelet therapy with aspirin and ticagrelor. 3. Aggressive secondary prevention. 4. Escalation of evidence-base HF therapy, as tolerated.  Will initiate gentle diuresis with furosemide 20 mg daily.  Nelva Bush, MD [place] Pager: [phone]     Patient Name:   Nicholas Schultz Date of Exam: 03/19/2019 Medical Rec #:  [id number]   Height:       70.0 in Accession #:    [id number]  Weight:       181.9 lb Date of Birth:  Apr 10, 1946    BSA:          2.00 m Patient Age:    73 years    BP:           102/76 mmHg Patient Gender: M           HR:           70 bpm. Exam Location:  [facility]  Procedure: 2D Echo, Color Doppler, Cardiac Doppler and Intracardiac            Opacification Agent  Indications:     Acute myocardial infarction   History:         Patient has prior history of Echocardiogram examinations, most                  recent 11/08/2018. HFrEF; CAD Risk Factors:Current Smoker.   Sonographer:     Charmayne Sheer RDCS (AE) Referring Phys:   [id number] CHRISTOPHER END Diagnosing Phys: Kate Sable MD    Sonographer Comments: No subcostal window and suboptimal parasternal window. IMPRESSIONS    1. Left ventricular ejection fraction, by visual estimation, is 25 to 30%. The left ventricle has severely decreased function. There is no left ventricular hypertrophy.  2. Definity contrast agent was given IV to delineate the left ventricular endocardial borders.  3. Left ventricular diastolic parameters are consistent with Grade II diastolic dysfunction (pseudonormalization).  4. Global right ventricle has normal systolic function.The right ventricular size is normal. Right vetricular wall thickness was not assessed.  5. Left atrial size was normal.  6. Right atrial size was normal.  7. The mitral valve is grossly normal. No evidence of mitral valve regurgitation.  8. The tricuspid valve is grossly normal. Tricuspid valve regurgitation is not demonstrated.  9. The aortic valve was not well visualized. Aortic valve regurgitation is not visualized. 10. The pulmonic valve was not assessed. Pulmonic valve regurgitation not assessed.  EDUCATION:   Rounded on patient.  Wife at bedside.  Patient gave verbal consent for this RN to speak about his health in fronto f his wife of 35 years.    "Heart Attack Bouncing Back" booklet  given and reviewed with patient. Discussed the definition of CAD. Reviewed the location of CAD and where his stent was placed. Informed patient he will be given a stent card. Explained the purpose of the stent card. Instructed patient to keep stent card in his wallet.  ? Discussed modifiable risk factors including controlling blood pressure, cholesterol, and blood sugar; following heart healthy diet; maintaining healthy weight; exercise; and smoking cessation, if applicable.   ? Discussed cardiac medications including rationale for taking, mechanisms of action, and side effects. Stressed the importance of taking  medications as prescribed. Patient shared with this RN how easy it was for him to bleed when on Plavix.  Patient being discharged on Brilinta, ASA, Lipitor, Metoprolol and Losartan..   ? Discussed emergency plan for heart attack symptoms. Patient verbalized understanding of need to call 911 and not to drive himself to ER if having cardiac symptoms / chest pain.    ? Diet of low sodium, low fat, low cholesterol heart healthy discussed.    ? Smoking Cessation - Patient is a CURRENT every day smoker. Patient has been advised to quit by his  cardiologist.  Patient reports smoking one cigar per day. Patient is not interested in quitting.   ? Exercise - Benefits of exercised discussed. Explained to patient he has been referred to outpatient Cardiac Rehab at [facility].  Overview of the program provided.  Patient stated, "I fell out of love with exercise a long time ago.  I understand the doctors needing to refer me to Cardiac Rehab, but I do not exercise nor do I want to participate in Cardiac Rehab."  As a result, the referral to Cardiac Rehab has been closed.    Patient / wife appreciative of the information.  ? Roanna Epley, RN, BSN, [place]  [facility] Card[facility] Pul[facility] Rehab  Cardiovascular & Pulmonary Nurse Navigator  Direct Line: [phone]  Department Phone #: [phone] Fax: [phone]  Email Address: [email or web address]

## 2019-03-20 NOTE — Progress Notes (Signed)
Attending Note Patient seen and examined, agree with detailed note above,  Patient presentation and plan discussed on rounds.  EKG lab work, chest x-ray, echocardiogram reviewed independently by myself  Reports that he feels relatively well Some agitation overnight, some shortness of breath, placed on oxygen No recorded fever Long discussion with him, reports long history of smoking cigars, actually inhales the smoke Chest x-ray this morning asymmetric right greater than left interstitial infiltrate  Echocardiogram reviewed ejection fraction 25 to 30% No change from echocardiogram June 2020  He is ambulating around the hallway, saturations 90 to 93% Brief dip down to 89, rapid improvement by stopping his walk and taking deep breaths  On examination : alert oriented, no JVD, lungs with scattered Rales, decreased breath sounds throughout heart sounds regular normal S1-S2 no murmurs appreciated, abdomen soft nontender no significant lower extremity edema.  Musculoskeletal exam with good range of motion, neurologic exam grossly nonfocal  Creatinine 1.3 BUN 23 sodium 138 hemoglobin 18 WBC 18.8  A/P: Anterior STEMI Stent placed to his proximal LAD He does have residual 50% left main disease, 50% ostial LAD disease Smoking cessation recommended On aspirin and Brilinta, compliance recommended Reports he ran out of his statin, this will be refilled Continue metoprolol Would recommend restart low-dose losartan 12.5 mg daily  Ischemic cardiomyopathy Ejection fraction 25%, no change compared to June 2020 -He has refused LifeVest and ICD placement Dose of Lasix 40 today x1 Was not on Lasix at home  Infiltrate Unclear finding on chest x-ray given long smoking history, cigars Denies having symptoms Was given 1 dose of Lasix today  Long discussion with him concerning discharge instructions, need to be compliant with his medications, discussed activities Greater than 50% was spent in  counseling and coordination of care with patient Total encounter time 35 minutes or more   Signed: Esmond Plants  M.D., Ph.D. [facility]

## 2019-03-20 NOTE — Discharge Instructions (Signed)
It was so nice to meet you during this hospitalization!  You came into the hospital with chest pain. We found that you had a heart attack. Your old stent formed a blood clot when you stopped taking the Brilinta. You will need to take aspirin 81mg  daily and brilinta 90mg  twice daily for the rest of your life. Please let Dr. Darnelle Bos office know if you are about to run out of one of these medications.  I have prescribed the following medications: 1. You were found to have a pneumonia while you were here- I prescribed an antibiotic called Doxycycline. Please take 1 tablet twice a day for 7 days. 2. I have ordered a fluid pill called lasix to help keep the fluid off of your lungs. Please take Lasix 40mg  daily. 3. I have ordered a medicine called losartan to help your heart beat stronger. Please take Losartan 12.5mg  (1/2 tablet) once daily. 4. I have also ordered some potassium to help keep your potassium level up. Please take potassium 58mEq daily.  Take care, Dr. Brett Albino

## 2019-03-20 NOTE — Progress Notes (Signed)
IVs and tele removed from patient. Discharge instructions given to patient. Verbalized understanding. No acute distress at this time. Family member at bedside and will transport patient home.  

## 2019-04-07 NOTE — Progress Notes (Signed)
Follow-up Outpatient Visit Date: 04/09/2019  Primary Care Provider: Patient, [name] Per No address on file  Chief Complaint: Passing out  HPI:  [name] is a 73 y.o. year-old male with history of coronary artery disease status post multiple MIs (most recently 03/18/2019 with PCI to the ostial LAD in the setting of non-compliance with antiplatelet therapy), chronic systolic heart failure secondary to ischemic cardiomyopathy, hyperlipidemia, and tobacco use, who presents for follow-up of coronary artery disease as well as assessment of recent syncopal episode.  He was hospitalized on 03/19/2019 with recurrent chest pain.  He had's discontinued ticagrelor a week earlier and was found to have late stent thrombosis of the proximal LAD.  He underwent successful PCI with additional stent placement back to the ostium of the LAD.  He has been compliant with aspirin and ticagrelor since then and has not had any further chest pain.  Few days ago, [name] was smoking on his porch.  He stood up and began walking and suddenly became lightheaded and fell to the ground.  He believes he passed out for a few seconds.  He did not have any prodromal symptoms including chest pain and palpitations.  He subsequently cut out one of his heart failure medications (either metoprolol succinate or losartan, he does not know which one) and feels like his lightheadedness has improved.  He has not had any further syncopal episodes.  He denies shortness of breath other than when wearing a mask.  He has not had any orthopnea, PND, or edema.  --------------------------------------------------------------------------------------------------  Past Medical History:  Diagnosis Date  . CAD (coronary artery disease)    a. s/p prior MI and stenting x 4 in [place], [place]; b. 06/2018 Ant STEMI/PCI: LM mod dzs, LAD 50ost, 99p (2.75x16 Synergy DES), 30p/m ISR, LCX nl, OM1 90, OM2 mod dzs, RCA large, 78m ISR, RPDA 70ost, RPAV 50.  [name]  HFrEF (heart failure with reduced ejection fraction) ([place])    a. 06/2018 Echo: EF 30-35%.  . Ischemic cardiomyopathy    a. 06/2018 Echo: EF 30-35%, apical septal, mid and apical lateral, apical, apical inf, mid and apical ant, and mid antsept AK w/ inflat HK.   . Tobacco abuse    a. 1 cigar/day. Prev smoked 3ppd cigarettes.   Past Surgical History:  Procedure Laterality Date  . CORONARY ANGIOPLASTY WITH STENT PLACEMENT    . CORONARY/GRAFT ACUTE MI REVASCULARIZATION N/A 07/16/2018   Procedure: Coronary/Graft Acute MI Revascularization;  Surgeon: Nelva Bush, MD;  Location: [place] [facility];  Service: Cardiovascular;  Laterality: N/A;  . CORONARY/GRAFT ACUTE MI REVASCULARIZATION N/A 03/18/2019   Procedure: Coronary/Graft Acute MI Revascularization;  Surgeon: Nelva Bush, MD;  Location: [place] [facility];  Service: Cardiovascular;  Laterality: N/A;  . IABP INSERTION N/A 07/16/2018   Procedure: IABP Insertion;  Surgeon: Nelva Bush, MD;  Location: [place] [facility];  Service: Cardiovascular;  Laterality: N/A;  . LEFT HEART CATH AND CORONARY ANGIOGRAPHY N/A 07/16/2018   Procedure: LEFT HEART CATH AND CORONARY ANGIOGRAPHY;  Surgeon: Nelva Bush, MD;  Location: [place] [facility];  Service: Cardiovascular;  Laterality: N/A;  . LEFT HEART CATH AND CORONARY ANGIOGRAPHY N/A 03/18/2019   Procedure: LEFT HEART CATH AND CORONARY ANGIOGRAPHY;  Surgeon: Nelva Bush, MD;  Location: [place] [facility];  Service: Cardiovascular;  Laterality: N/A;    Current Meds  Medication Sig  . aspirin EC 81 MG tablet Take 1 tablet (81 mg total) by mouth daily.  [name] atorvastatin (LIPITOR)  80 MG tablet TAKE 1 TABLET BY MOUTH EVERY DAY AT 6 PM  . flavoxATE (URISPAS) 100 MG tablet Take 100 mg by mouth as needed (urinary frequency).  . furosemide (LASIX) 40 MG tablet Take 1 tablet (40 mg total) by mouth daily as needed for edema (swelling).  . metoprolol succinate (TOPROL XL) 25 MG 24 hr  tablet Take 0.5 tablets (12.5 mg total) by mouth daily.  . nitroGLYCERIN (NITROSTAT) 0.4 MG SL tablet Place 1 tablet (0.4 mg total) under the tongue every 5 (five) minutes x 3 doses as needed for chest pain.  . potassium chloride (KLOR-CON) 10 MEQ tablet Take 1 tablet (10 mEq total) by mouth daily.  . ticagrelor (BRILINTA) 90 MG TABS tablet Take 1 tablet (90 mg total) by mouth 2 (two) times daily.  . [DISCONTINUED] furosemide (LASIX) 40 MG tablet Take 1 tablet (40 mg total) by mouth daily.  . [DISCONTINUED] losartan (COZAAR) 25 MG tablet Take 0.5 tablets (12.5 mg total) by mouth daily.    Allergies: Patient has no known allergies.  Social History   Tobacco Use  . Smoking status: Current Every Day Smoker    Types: Cigars  . Smokeless tobacco: Never Used  Substance Use Topics  . Alcohol use: Yes    Comment: rare  . Drug use: Not on file    History reviewed. No pertinent family history.  Review of Systems: A 12-system review of systems was performed and was negative except as noted in the HPI.  --------------------------------------------------------------------------------------------------  Physical Exam: BP 99/66 (BP Location: Left Arm, Patient Position: Sitting, Cuff Size: Normal)   Pulse 70   Ht 5\' 11"  (1.803 m)   Wt 177 lb (80.3 kg)   BMI 24.69 kg/m   General: NAD. HEENT: No conjunctival pallor or scleral icterus. Moist mucous membranes.  OP clear. Neck: Supple without lymphadenopathy, thyromegaly, JVD, or HJR. Lungs: Normal work of breathing. Clear to auscultation bilaterally without wheezes or crackles. Heart: Regular rate and rhythm without murmurs, rubs, or gallops. Non-displaced PMI. Abd: Bowel sounds present. Soft, NT/ND without hepatosplenomegaly Ext: No lower extremity edema.  2+ radial pulses.  Right radial arteriotomy site is well-healed.   Skin: Warm and dry without rash.  EKG:  Normal sinus rhythm with occasional PVCs, low voltage, nonspecific  intraventricular conduction delay, and lateral ST depressions and T wave inversions.  PVC is new from 03/19/2019.  Otherwise, there has been no significant interval change.  Lab Results  Component Value Date   WBC 18.8 (H) 03/20/2019   HGB 18.1 (H) 03/20/2019   HCT 52.9 (H) 03/20/2019   MCV 88.9 03/20/2019   PLT 199 03/20/2019    Lab Results  Component Value Date   NA 138 03/20/2019   K 4.3 03/20/2019   CL 105 03/20/2019   CO2 22 03/20/2019   BUN 23 03/20/2019   CREATININE 1.31 (H) 03/20/2019   GLUCOSE 118 (H) 03/20/2019   ALT 31 03/18/2019    Lab Results  Component Value Date   CHOL 185 03/18/2019   HDL 40 (L) 03/18/2019   LDLCALC 119 (H) 03/18/2019   TRIG 131 03/18/2019   CHOLHDL 4.6 03/18/2019    --------------------------------------------------------------------------------------------------  ASSESSMENT AND PLAN: Syncope: [name] reports that an episode of transient lightheadedness and brief loss of consciousness immediately after standing up.  I suspect this was most likely due to transient blood pressure drop related to his severe systolic heart failure and medications.  His blood pressure is soft today, having already discontinued either metoprolol  or losartan (he does not recall which one).  I recommend discontinuation of losartan and use of furosemide only on an as needed basis.  [name] should continue metoprolol succinate 12.5 mg daily, if possible.  Given his severe ischemic cardiomyopathy, ventricular arrhythmia is certainly a possibility, though circumstances of this episode are less consistent with tachyarrhythmia.  Nonetheless, I have recommended consideration for ICD placement, though [name] refuses.  Coronary artery disease with recent STEMI: No recurrent chest pain following PCI for late stent thrombosis in the setting of noncompliance with antiplatelet therapy.  I stressed the importance of continuing indefinite dual antiplatelet therapy with aspirin  and ticagrelor.  I encouraged [name] to consider cardiac rehab, though he prefers not to participate in any formal rehabilitation program.  Chronic systolic heart failure due to ischemic cardiomyopathy: [name] appears euvolemic with NYHA class II symptoms.  As above, we will discontinue losartan as well as use furosemide only on an as needed basis due to soft blood pressure and recent lightheadedness/fall.  Hyperlipidemia: Continue atorvastatin 80 mg daily.  Tobacco abuse: [name] continues to smoke cigars.  I have encouraged him to stop this though he is not willing to do this.  Follow-up: Return to clinic in 2 months.  Nelva Bush, MD 04/10/2019 7:01 AM

## 2019-04-09 ENCOUNTER — Other Ambulatory Visit: Payer: Self-pay

## 2019-04-09 ENCOUNTER — Encounter: Payer: Self-pay | Admitting: Internal Medicine

## 2019-04-09 ENCOUNTER — Ambulatory Visit (INDEPENDENT_AMBULATORY_CARE_PROVIDER_SITE_OTHER): Payer: Medicare HMO | Admitting: Internal Medicine

## 2019-04-09 VITALS — BP 99/66 | HR 70 | Ht 71.0 in | Wt 177.0 lb

## 2019-04-09 DIAGNOSIS — I5022 Chronic systolic (congestive) heart failure: Secondary | ICD-10-CM

## 2019-04-09 DIAGNOSIS — I2109 ST elevation (STEMI) myocardial infarction involving other coronary artery of anterior wall: Secondary | ICD-10-CM

## 2019-04-09 DIAGNOSIS — R55 Syncope and collapse: Secondary | ICD-10-CM

## 2019-04-09 DIAGNOSIS — I255 Ischemic cardiomyopathy: Secondary | ICD-10-CM | POA: Diagnosis not present

## 2019-04-09 DIAGNOSIS — E785 Hyperlipidemia, unspecified: Secondary | ICD-10-CM

## 2019-04-09 DIAGNOSIS — Z720 Tobacco use: Secondary | ICD-10-CM

## 2019-04-09 MED ORDER — FUROSEMIDE 40 MG PO TABS: 40.0000 mg | ORAL_TABLET | Freq: Every day | ORAL | 3 refills | Status: DC | PRN

## 2019-04-09 NOTE — Patient Instructions (Signed)
Medication Instructions:  Your physician has recommended you make the following change in your medication:  1- STOP Losartan. 2- CHANGE Furosemide to 40 mg by mouth daily as needed for edema/swelling.   *If you need a refill on your cardiac medications before your next appointment, please call your pharmacy*  Lab Work: none If you have labs (blood work) drawn today and your tests are completely normal, you will receive your results only by: [name] MyChart Message (if you have MyChart) OR . A paper copy in the mail If you have any lab test that is abnormal or we need to change your treatment, we will call you to review the results.  Testing/Procedures: none  Follow-Up: At [facility], you and your health needs are our priority.  As part of our continuing mission to provide you with exceptional heart care, we have created designated Provider Care Teams.  These Care Teams include your primary Cardiologist (physician) and Advanced Practice Providers (APPs -  Physician Assistants and Nurse Practitioners) who all work together to provide you with the care you need, when you need it.  Your next appointment:   2 month(s)  The format for your next appointment:   In Person  Provider:    You may see Nelva Bush, MD or one of the following Advanced Practice Providers on your designated Care Team:    Murray Hodgkins, NP  Christell Faith, PA-C  Marrianne Mood, PA-C   Other Instructions  Please increase your activity and exercise. Dr End recommends you walk for 10 minutes a day for 3 days of the week. Then gradually increase to doing 30 minutes of walking a day for 5 days of the week.

## 2019-04-10 ENCOUNTER — Encounter: Payer: Self-pay | Admitting: Internal Medicine

## 2019-04-10 DIAGNOSIS — R55 Syncope and collapse: Secondary | ICD-10-CM | POA: Insufficient documentation

## 2019-05-21 ENCOUNTER — Other Ambulatory Visit: Payer: Self-pay | Admitting: Internal Medicine

## 2019-05-21 NOTE — Telephone Encounter (Signed)
*  STAT* If patient is at the pharmacy, call can be transferred to refill team.   1. Which medications need to be refilled? (please list name of each medication and dose if known) Brilinta  2. Which pharmacy/location (including street and city if local pharmacy) is medication to be sent to? [facility]  3. Do they need a 30 day or 90 day supply? 90  Patient has been out for 2 weeks

## 2019-05-22 ENCOUNTER — Other Ambulatory Visit: Payer: Self-pay | Admitting: Cardiology

## 2019-05-22 ENCOUNTER — Telehealth: Payer: Self-pay | Admitting: Internal Medicine

## 2019-05-22 ENCOUNTER — Ambulatory Visit (INDEPENDENT_AMBULATORY_CARE_PROVIDER_SITE_OTHER): Payer: Medicare HMO | Admitting: Internal Medicine

## 2019-05-22 ENCOUNTER — Encounter: Payer: Self-pay | Admitting: Internal Medicine

## 2019-05-22 ENCOUNTER — Other Ambulatory Visit: Payer: Self-pay

## 2019-05-22 VITALS — BP 116/82 | HR 93 | Ht 71.0 in | Wt 184.0 lb

## 2019-05-22 DIAGNOSIS — I5023 Acute on chronic systolic (congestive) heart failure: Secondary | ICD-10-CM

## 2019-05-22 DIAGNOSIS — E785 Hyperlipidemia, unspecified: Secondary | ICD-10-CM | POA: Diagnosis not present

## 2019-05-22 DIAGNOSIS — I2510 Atherosclerotic heart disease of native coronary artery without angina pectoris: Secondary | ICD-10-CM | POA: Diagnosis not present

## 2019-05-22 MED ORDER — TICAGRELOR 90 MG PO TABS: 90.0000 mg | ORAL_TABLET | Freq: Two times a day (BID) | ORAL | 0 refills | Status: DC

## 2019-05-22 MED ORDER — ATORVASTATIN CALCIUM 80 MG PO TABS: ORAL_TABLET | ORAL | 2 refills | Status: DC

## 2019-05-22 MED ORDER — FUROSEMIDE 40 MG PO TABS: ORAL_TABLET | ORAL | 5 refills | Status: AC

## 2019-05-22 NOTE — Telephone Encounter (Signed)
Received call from wife with the concerns of 5 lb weight gain and shortness of breath when laying down which started last night. Denies lower extremity swelling or swelling anywhere else. Denies chest pain. Patient's O2 is 96 % on room air. Denies fever. Has usual smoker's cough. Patient reported to wife that  "feels like lungs are full" when laying down. In going over med list, wife stated patient will not take furosemide because when he did it made him dizzy. Wife also noted that patient had run out of Brilinta for 2 weeks. Once wife discovered this she went and got the refill. Patient resumed Brilinta last night. Dr End has opening this afternoon and advised it would be recommended for patient to be seen. She said it is difficult to convince patient but that she would like him evaluated. She will get patient to appointment.

## 2019-05-22 NOTE — Patient Instructions (Signed)
Medication Instructions:  Your physician has recommended you make the following change in your medication:  1- TAKE Furosemide 40 mg by mouth daily for 2-3 days until weight is back down 5 lb. THEN take 40 mg by mouth daily AS NEEDED for swelling.  *If you need a refill on your cardiac medications before your next appointment, please call your pharmacy*  Lab Work: none If you have labs (blood work) drawn today and your tests are completely normal, you will receive your results only by: [name] MyChart Message (if you have MyChart) OR . A paper copy in the mail If you have any lab test that is abnormal or we need to change your treatment, we will call you to review the results.  Testing/Procedures: none  Follow-Up: At [facility], you and your health needs are our priority.  As part of our continuing mission to provide you with exceptional heart care, we have created designated Provider Care Teams.  These Care Teams include your primary Cardiologist (physician) and Advanced Practice Providers (APPs -  Physician Assistants and Nurse Practitioners) who all work together to provide you with the care you need, when you need it.  Your next appointment:   1 month(s)  The format for your next appointment:   In Person  Provider:    You may see Nelva Bush, MD or one of the following Advanced Practice Providers on your designated Care Team:    Murray Hodgkins, NP  Christell Faith, PA-C  Marrianne Mood, PA-C

## 2019-05-22 NOTE — Progress Notes (Signed)
Follow-up Outpatient Visit Date: 05/22/2019  Primary Care Provider: Patient, [name] Per No address on file  Chief Complaint: Shortness of breath and weight gain  HPI:  [name] is a 73 y.o. male with history of coronary artery disease status post multiple MIs (most recently 03/18/2019 with PCI to the ostial LAD in the setting of non-compliance with antiplatelet therapy), chronic systolic heart failure secondary to ischemic cardiomyopathy, hyperlipidemia, and tobacco use, who presents for acute evaluation of weight gain and shortness of breath (primarily at the urging of his wife).  [name] had been doing well until last night without chest pain or shortness of breath, though he has gradually put on 6-8 pounds over the last month.  He attributes this to dietary indiscretion, eating lots of cookies and cake.  He does not feel like his sodium intake has changed much from baseline (though he does not restrict his salt intake).  Last night, he became acutely short of breath while lying down and ultimately needed to sleep in a recliner.  This morning, he also became short of breath while taking a shows (which is unusual for him).  He denies leg edema.  [name] notes that he has not taken and furosemide since our last visit in November.  He also ran out of ticagrelor for ~2 weeks ago but began taking it again yesterday.  He also ran out of atorvastatin and has not restarted this.  --------------------------------------------------------------------------------------------------  Past Medical History:  Diagnosis Date  . CAD (coronary artery disease)    a. s/p prior MI and stenting x 4 in [place], [place]; b. 06/2018 Ant STEMI/PCI: LM mod dzs, LAD 50ost, 99p (2.75x16 Synergy DES), 30p/m ISR, LCX nl, OM1 90, OM2 mod dzs, RCA large, 36m ISR, RPDA 70ost, RPAV 50.  [name] HFrEF (heart failure with reduced ejection fraction) ([place])    a. 06/2018 Echo: EF 30-35%.  . Ischemic cardiomyopathy    a. 06/2018  Echo: EF 30-35%, apical septal, mid and apical lateral, apical, apical inf, mid and apical ant, and mid antsept AK w/ inflat HK.   . Tobacco abuse    a. 1 cigar/day. Prev smoked 3ppd cigarettes.   Past Surgical History:  Procedure Laterality Date  . CORONARY ANGIOPLASTY WITH STENT PLACEMENT    . CORONARY/GRAFT ACUTE MI REVASCULARIZATION N/A 07/16/2018   Procedure: Coronary/Graft Acute MI Revascularization;  Surgeon: Nelva Bush, MD;  Location: [facility];  Service: Cardiovascular;  Laterality: N/A;  . CORONARY/GRAFT ACUTE MI REVASCULARIZATION N/A 03/18/2019   Procedure: Coronary/Graft Acute MI Revascularization;  Surgeon: Nelva Bush, MD;  Location: [facility];  Service: Cardiovascular;  Laterality: N/A;  . IABP INSERTION N/A 07/16/2018   Procedure: IABP Insertion;  Surgeon: Nelva Bush, MD;  Location: [facility];  Service: Cardiovascular;  Laterality: N/A;  . LEFT HEART CATH AND CORONARY ANGIOGRAPHY N/A 07/16/2018   Procedure: LEFT HEART CATH AND CORONARY ANGIOGRAPHY;  Surgeon: Nelva Bush, MD;  Location: [facility];  Service: Cardiovascular;  Laterality: N/A;  . LEFT HEART CATH AND CORONARY ANGIOGRAPHY N/A 03/18/2019   Procedure: LEFT HEART CATH AND CORONARY ANGIOGRAPHY;  Surgeon: Nelva Bush, MD;  Location: [facility];  Service: Cardiovascular;  Laterality: N/A;    Current Meds  Medication Sig  . aspirin EC 81 MG tablet Take 1 tablet (81 mg total) by mouth daily.  [name] atorvastatin (LIPITOR) 80 MG tablet TAKE 1 TABLET BY MOUTH EVERY DAY AT 6 PM  . flavoxATE (  URISPAS) 100 MG tablet Take 100 mg by mouth as needed (urinary frequency).  . metoprolol succinate (TOPROL XL) 25 MG 24 hr tablet Take 0.5 tablets (12.5 mg total) by mouth daily.  . nitroGLYCERIN (NITROSTAT) 0.4 MG SL tablet Place 1 tablet (0.4 mg total) under the tongue every 5 (five) minutes x 3 doses as needed for chest pain.  . potassium chloride (KLOR-CON) 10  MEQ tablet Take 1 tablet (10 mEq total) by mouth daily.  . ticagrelor (BRILINTA) 90 MG TABS tablet Take 1 tablet (90 mg total) by mouth 2 (two) times daily.  . [DISCONTINUED] atorvastatin (LIPITOR) 80 MG tablet TAKE 1 TABLET BY MOUTH EVERY DAY AT 6 PM    Allergies: Patient has no known allergies.  Social History   Tobacco Use  . Smoking status: Current Every Day Smoker    Types: Cigars  . Smokeless tobacco: Never Used  Substance Use Topics  . Alcohol use: Yes    Comment: rare  . Drug use: Never    History reviewed. No pertinent family history.  Review of Systems: A 12-system review of systems was performed and was negative except as noted in the HPI.  --------------------------------------------------------------------------------------------------  Physical Exam: BP 116/82 (BP Location: Left Arm, Patient Position: Sitting, Cuff Size: Normal)   Pulse 93   Ht 5\' 11"  (1.803 m)   Wt 184 lb (83.5 kg)   SpO2 98%   BMI 25.66 kg/m   General:  NAD. HEENT: No conjunctival pallor or scleral icterus. Facemask in place. Neck: Supple without lymphadenopathy, thyromegaly, JVD, or HJR. Lungs: Normal work of breathing. Mildly diminished breath sounds without wheezes or crackles. Heart: Regular rate and rhythm without murmurs, rubs, or gallops. Non-displaced PMI. Abd: Bowel sounds present. Soft, NT/ND without hepatosplenomegaly Ext: No lower extremity edema. Radial, PT, and DP pulses are 2+ bilaterally. Skin: Warm and dry without rash.  EKG:  NSR with incomplete LBBB, poor R wave progression, and nonspecific T wave changes.  Lateral ST/T changes are less pronounced than on prior tracing on 04/09/2019.  Lab Results  Component Value Date   WBC 18.8 (H) 03/20/2019   HGB 18.1 (H) 03/20/2019   HCT 52.9 (H) 03/20/2019   MCV 88.9 03/20/2019   PLT 199 03/20/2019    Lab Results  Component Value Date   NA 138 03/20/2019   K 4.3 03/20/2019   CL 105 03/20/2019   CO2 22 03/20/2019    BUN 23 03/20/2019   CREATININE 1.31 (H) 03/20/2019   GLUCOSE 118 (H) 03/20/2019   ALT 31 03/18/2019    Lab Results  Component Value Date   CHOL 185 03/18/2019   HDL 40 (L) 03/18/2019   LDLCALC 119 (H) 03/18/2019   TRIG 131 03/18/2019   CHOLHDL 4.6 03/18/2019    --------------------------------------------------------------------------------------------------  ASSESSMENT AND PLAN: Acute on chronic systolic heart failure: Weight gain and shortness of breath/orthopnea are consistent with acute heart failure, though he does not have significant edema or JVD on exam.  Dietary indiscretion are likely to blame.  I have advised [name] to take furosemide 40 mg daily until his weight is back to baseline (down ~5 pounds) and to minimize his sodium intake.  Coronary artery disease without angina: No angina reported in spite of having missed ticagrelor for ~2 weeks and history of stent thrombosis in October after having come off antiplatelet therapy for ~1 week.  I stressed the importance of being compliant with aspirin and ticagrelor.  He will continue to take the medication as prescribed,  though he has a repeated history of non-compliance.  Hyperlipidemia: [name] is agreeable to restarting atorvastatin.  Refill was sent in today.  Follow-up: Return to clinic in 1 month.  Nelva Bush, MD 05/23/2019 10:55 AM

## 2019-05-22 NOTE — Telephone Encounter (Signed)
Please advise if ok to refill Brilinta 90 mg tablet bid. Medication last filled by Hyman Bible, MD [facility])

## 2019-05-22 NOTE — Telephone Encounter (Signed)
Pt c/o Shortness Of Breath: STAT if SOB developed within the last 24 hours or pt is noticeably SOB on the phone  1. Are you currently SOB (can you hear that pt is SOB on the phone)? No   2. How long have you been experiencing SOB? Since last night  3. Are you SOB when sitting or when up moving around? Only when laying down   4. Are you currently experiencing any other symptoms? 5 lbs over christmas smoker   Patient was out for 2 weeks of brilinta   Declined dod until talking to the nurse.

## 2019-05-23 ENCOUNTER — Encounter: Payer: Self-pay | Admitting: Internal Medicine

## 2019-05-26 ENCOUNTER — Telehealth: Payer: Self-pay | Admitting: Internal Medicine

## 2019-05-26 NOTE — Telephone Encounter (Signed)
1 m fu per checkout 05/21/19 .  Attempted to push out already scheduled appt. No ans no vm .

## 2019-06-09 NOTE — Progress Notes (Signed)
Follow-up Outpatient Visit Date: 06/11/2019  Primary Care Provider: Patient, [name] Per No address on file  Chief Complaint: Follow-up heart failure and coronary artery disease  HPI:  [name] is a 74 y.o. male with history of coronary artery disease status post multiple MIs (most recently10/27/2020with PCI to theostialLADin the setting of non-compliance with antiplatelet therapy), chronic systolic heart failure secondary to ischemic cardiomyopathy, hyperlipidemia, and tobacco use, who presents for follow-up of coronary artery disease and recent heart failure exacerbation.  I last saw him on 05/22/2019 for urgent evaluation of weight gain and orthopnea.  [name] had put on 6-8 pound over the preceding month.  He had not taken any furosemide for >1 month and had also missed ticagrelor for ~2 weeks (he had restarted this the day before our visit).  I advised him to take furosemide 40 mg daily for 2-3 days (until weight back to baseline), then 40 mg daily as needed for weight gain/edema.  The importance of medication compliance, particularly with antiplatelet therapy was reinforced.  Today, [name] reports that he is feeling well.  He took furosemide for a few days after our last visit with resolution of his edema and shortness of breath.  He had been doing well until last night when he developed orthopnea.  He took a dose of furosemide around 3 AM with prompt resolution of the orthopnea.  He feels like his breathing is back to normal.  He denies chest pain, palpitations, lightheadedness, and edema.  He stopped taking metoprolol a few weeks ago because he ran out of the medication and did not see any refills on his prescription.  He remains on aspirin, ticagrelor, and atorvastatin.  --------------------------------------------------------------------------------------------------  Past Medical History:  Diagnosis Date  . CAD (coronary artery disease)    a. s/p prior MI and stenting x 4 in  [place], [place]; b. 06/2018 Ant STEMI/PCI: LM mod dzs, LAD 50ost, 99p (2.75x16 Synergy DES), 30p/m ISR, LCX nl, OM1 90, OM2 mod dzs, RCA large, 97m ISR, RPDA 70ost, RPAV 50.  [name] HFrEF (heart failure with reduced ejection fraction) ([place])    a. 06/2018 Echo: EF 30-35%.  . Ischemic cardiomyopathy    a. 06/2018 Echo: EF 30-35%, apical septal, mid and apical lateral, apical, apical inf, mid and apical ant, and mid antsept AK w/ inflat HK.   . Tobacco abuse    a. 1 cigar/day. Prev smoked 3ppd cigarettes.   Past Surgical History:  Procedure Laterality Date  . CORONARY ANGIOPLASTY WITH STENT PLACEMENT    . CORONARY/GRAFT ACUTE MI REVASCULARIZATION N/A 07/16/2018   Procedure: Coronary/Graft Acute MI Revascularization;  Surgeon: Nelva Bush, MD;  Location: [facility];  Service: Cardiovascular;  Laterality: N/A;  . CORONARY/GRAFT ACUTE MI REVASCULARIZATION N/A 03/18/2019   Procedure: Coronary/Graft Acute MI Revascularization;  Surgeon: Nelva Bush, MD;  Location: [facility];  Service: Cardiovascular;  Laterality: N/A;  . IABP INSERTION N/A 07/16/2018   Procedure: IABP Insertion;  Surgeon: Nelva Bush, MD;  Location: [facility];  Service: Cardiovascular;  Laterality: N/A;  . LEFT HEART CATH AND CORONARY ANGIOGRAPHY N/A 07/16/2018   Procedure: LEFT HEART CATH AND CORONARY ANGIOGRAPHY;  Surgeon: Nelva Bush, MD;  Location: [facility];  Service: Cardiovascular;  Laterality: N/A;  . LEFT HEART CATH AND CORONARY ANGIOGRAPHY N/A 03/18/2019   Procedure: LEFT HEART CATH AND CORONARY ANGIOGRAPHY;  Surgeon: Nelva Bush, MD;  Location: [facility];  Service: Cardiovascular;  Laterality: N/A;  Current Meds  Medication Sig  . aspirin EC 81 MG tablet Take 1 tablet (81 mg total) by mouth daily.  [name] atorvastatin (LIPITOR) 80 MG tablet TAKE 1 TABLET BY MOUTH EVERY DAY AT 6 PM  . flavoxATE (URISPAS) 100 MG tablet Take 100 mg by mouth as needed  (urinary frequency).  . furosemide (LASIX) 40 MG tablet Take 1 tablet (40 mg) by mouth once a day for 2-3 days until weight down 5 lbs. Then take 1 tablet (40 mg) by mouth daily as needed for swelling.  . nitroGLYCERIN (NITROSTAT) 0.4 MG SL tablet Place 1 tablet (0.4 mg total) under the tongue every 5 (five) minutes x 3 doses as needed for chest pain.  . potassium chloride (KLOR-CON) 10 MEQ tablet Take 1 tablet (10 mEq total) by mouth daily.  . ticagrelor (BRILINTA) 90 MG TABS tablet Take 1 tablet (90 mg total) by mouth 2 (two) times daily.    Allergies: Patient has no known allergies.  Social History   Tobacco Use  . Smoking status: Current Every Day Smoker    Types: Cigars  . Smokeless tobacco: Never Used  Substance Use Topics  . Alcohol use: Yes    Comment: rare  . Drug use: Never    History reviewed. No pertinent family history.  Review of Systems: A 12-system review of systems was performed and was negative except as noted in the HPI.  --------------------------------------------------------------------------------------------------  Physical Exam: BP (!) 80/60 (BP Location: Left Arm, Patient Position: Sitting, Cuff Size: Normal)   Pulse (!) 105   Ht 5\' 11"  (1.803 m)   Wt 178 lb 8 oz (81 kg)   SpO2 99%   BMI 24.90 kg/m   Repeat BP 92/60  General: NAD. HEENT: No conjunctival pallor or scleral icterus. Facemask in place. Neck: Supple without lymphadenopathy, thyromegaly, JVD, or HJR. Lungs: Normal work of breathing. Clear to auscultation bilaterally without wheezes or crackles. Heart: Regular rate and rhythm without murmurs, rubs, or gallops. Non-displaced PMI. Abd: Bowel sounds present. Soft, NT/ND without hepatosplenomegaly Ext: No lower extremity edema. Skin: Warm and dry without rash.  EKG:  Sinus tachycardia (HR 102 bpm) with lateral T wave inversions and subtle ST depressions.  Heart rate has increased slightly from 05/22/2019; otherwise, there has been no  significant interval change.  Lab Results  Component Value Date   WBC 18.8 (H) 03/20/2019   HGB 18.1 (H) 03/20/2019   HCT 52.9 (H) 03/20/2019   MCV 88.9 03/20/2019   PLT 199 03/20/2019    Lab Results  Component Value Date   NA 138 03/20/2019   K 4.3 03/20/2019   CL 105 03/20/2019   CO2 22 03/20/2019   BUN 23 03/20/2019   CREATININE 1.31 (H) 03/20/2019   GLUCOSE 118 (H) 03/20/2019   ALT 31 03/18/2019    Lab Results  Component Value Date   CHOL 185 03/18/2019   HDL 40 (L) 03/18/2019   LDLCALC 119 (H) 03/18/2019   TRIG 131 03/18/2019   CHOLHDL 4.6 03/18/2019    --------------------------------------------------------------------------------------------------  ASSESSMENT AND PLAN: Coronary artery disease without angina: [name] has not had any symptoms to suggest worsening coronary insufficiency in the setting of multivessel CAD status post multiple PCI's.  We will continue indefinite antiplatelet therapy with aspirin and ticagrelor (he has had bleeding issues with clopidogrel in the past and refuses to take this medication).  Given history of late stent thrombosis in the setting of discontinuing ticagrelor 4 months after PCI to the proximal LAD, he  will likely need indefinite DAPT (though we could consider removal of aspirin and monotherapy with ticagrelor when he is at least 3 months out from his most recent intervention).  Chronic systolic heart failure due to ischemic cardiomyopathy: [name] appears euvolemic on exam today.  He notes transient orthopnea last night that resolved with single dose of furosemide.  His blood pressure is a little lower than normal, albeit asymptomatic.  I wonder if he could actually be a little intravascularly volume depleted and have encouraged him to drink a little extra water today.  He can continue to use furosemide if needed were he to develop worsening heart failure symptoms.  Importance of sodium restriction was reinforced.  In the setting  of hypotension, I will defer restarting metoprolol as well as challenging [name] with an ACE inhibitor or ARB.  He has previously declined EP referral to discuss ICD placement.  Hyperlipidemia: Continue atorvastatin.  Follow-up: Return to clinic in 1 month.  Nelva Bush, MD 06/11/2019 11:19 AM

## 2019-06-11 ENCOUNTER — Other Ambulatory Visit: Payer: Self-pay

## 2019-06-11 ENCOUNTER — Encounter: Payer: Self-pay | Admitting: Internal Medicine

## 2019-06-11 ENCOUNTER — Ambulatory Visit (INDEPENDENT_AMBULATORY_CARE_PROVIDER_SITE_OTHER): Payer: Medicare HMO | Admitting: Internal Medicine

## 2019-06-11 VITALS — BP 80/60 | HR 105 | Ht 71.0 in | Wt 178.5 lb

## 2019-06-11 DIAGNOSIS — I5022 Chronic systolic (congestive) heart failure: Secondary | ICD-10-CM | POA: Diagnosis not present

## 2019-06-11 DIAGNOSIS — I255 Ischemic cardiomyopathy: Secondary | ICD-10-CM

## 2019-06-11 DIAGNOSIS — E785 Hyperlipidemia, unspecified: Secondary | ICD-10-CM | POA: Diagnosis not present

## 2019-06-11 DIAGNOSIS — I2510 Atherosclerotic heart disease of native coronary artery without angina pectoris: Secondary | ICD-10-CM

## 2019-06-11 NOTE — Patient Instructions (Signed)
Medication Instructions:  Your physician has recommended you make the following change in your medication:  STOP Metoprolol.  *If you need a refill on your cardiac medications before your next appointment, please call your pharmacy*  Lab Work: none If you have labs (blood work) drawn today and your tests are completely normal, you will receive your results only by: [name] MyChart Message (if you have MyChart) OR . A paper copy in the mail If you have any lab test that is abnormal or we need to change your treatment, we will call you to review the results.  Testing/Procedures: none  Follow-Up: At [facility], you and your health needs are our priority.  As part of our continuing mission to provide you with exceptional heart care, we have created designated Provider Care Teams.  These Care Teams include your primary Cardiologist (physician) and Advanced Practice Providers (APPs -  Physician Assistants and Nurse Practitioners) who all work together to provide you with the care you need, when you need it.  Your next appointment:   1 month(s)  The format for your next appointment:   In Person  Provider:    You may see Nelva Bush, MD or one of the following Advanced Practice Providers on your designated Care Team:    Murray Hodgkins, NP  Christell Faith, PA-C  Marrianne Mood, PA-C   Other Instructions  Please monitor your blood pressure and heart rate at home. Please call us if it is consistently less than 90/60 and you have symptoms such as dizziness.    How to Take Your Blood Pressure You can take your blood pressure at home with a machine. You may need to check your blood pressure at home:  To check if you have high blood pressure (hypertension).  To check your blood pressure over time.  To make sure your blood pressure medicine is working. Supplies needed: You will need a blood pressure machine, or monitor. You can buy one at a drugstore or online. When choosing one:   Choose one with an arm cuff.  Choose one that wraps around your upper arm. Only one finger should fit between your arm and the cuff.  Do not choose one that measures your blood pressure from your wrist or finger. Your doctor can suggest a monitor. How to prepare Avoid these things for 30 minutes before checking your blood pressure:  Drinking caffeine.  Drinking alcohol.  Eating.  Smoking.  Exercising. Five minutes before checking your blood pressure:  Pee.  Sit in a dining chair. Avoid sitting in a soft couch or armchair.  Be quiet. Do not talk. How to take your blood pressure Follow the instructions that came with your machine. If you have a digital blood pressure monitor, these may be the instructions: 1. Sit up straight. 2. Place your feet on the floor. Do not cross your ankles or legs. 3. Rest your left arm at the level of your heart. You may rest it on a table, desk, or chair. 4. Pull up your shirt sleeve. 5. Wrap the blood pressure cuff around the upper part of your left arm. The cuff should be 1 inch (2.5 cm) above your elbow. It is best to wrap the cuff around bare skin. 6. Fit the cuff snugly around your arm. You should be able to place only one finger between the cuff and your arm. 7. Put the cord inside the groove of your elbow. 8. Press the power button. 9. Sit quietly while the cuff fills  with air and loses air. 10. Write down the numbers on the screen. 11. Wait 2-3 minutes and then repeat steps 1-10. What do the numbers mean? Two numbers make up your blood pressure. The first number is called systolic pressure. The second is called diastolic pressure. An example of a blood pressure reading is "120 over 80" (or 120/80).

## 2019-06-12 ENCOUNTER — Encounter: Payer: Self-pay | Admitting: Internal Medicine

## 2019-07-11 NOTE — Progress Notes (Signed)
Follow-up Outpatient Visit Date: 07/14/2019  Primary Care Provider: Patient, [name] Per No address on file  Chief Complaint: Follow-up coronary artery disease and heart failure  HPI:  Nicholas Schultz is a 74 y.o. male with history of coronary artery disease status post multiple MIs (most recently10/27/2020with PCI to theostialLADin the setting of non-compliance with antiplatelet therapy), chronic systolic heart failure secondary to ischemic cardiomyopathy, hyperlipidemia, and tobacco use, who presents for follow-up of CAD and heart failure.  I last saw him a month ago, at which time he was feeling well, though he reported a single episode of orthopnea the night before our visit that prompted him to take a single dose of furosemide.  He had run out of metoprolol a few weeks earlier and did not reach out to [place] about a refill.  However, given soft blood pressure (albeit asymptomatic), we did not restart metoprolol.  Today, Nicholas Schultz reports that he is feeling fine.  He stopped taking ticagrelor (this had been a recurrent issue) because he doesn't like the idea of taking medications.  He denies chest pain, shortness of breath, palpitations, lightheadedness, and edema.  He has not had any recurrent orthopnea nor has needed to take furosemide.  --------------------------------------------------------------------------------------------------  Past Medical History:  Diagnosis Date  . CAD (coronary artery disease)    a. s/p prior MI and stenting x 4 in [place], [place]; b. 06/2018 Ant STEMI/PCI: LM mod dzs, LAD 50ost, 99p (2.75x16 Synergy DES), 30p/m ISR, LCX nl, OM1 90, OM2 mod dzs, RCA large, 74m ISR, RPDA 70ost, RPAV 50.  [name] HFrEF (heart failure with reduced ejection fraction) ([place])    a. 06/2018 Echo: EF 30-35%.  . Ischemic cardiomyopathy    a. 06/2018 Echo: EF 30-35%, apical septal, mid and apical lateral, apical, apical inf, mid and apical ant, and mid antsept AK w/ inflat HK.   . Tobacco abuse    a. 1 cigar/day. Prev smoked 3ppd cigarettes.   Past Surgical History:  Procedure Laterality Date  . CORONARY ANGIOPLASTY WITH STENT PLACEMENT    . CORONARY/GRAFT ACUTE MI REVASCULARIZATION N/A 07/16/2018   Procedure: Coronary/Graft Acute MI Revascularization;  Surgeon: Nelva Bush, MD;  Location: [facility];  Service: Cardiovascular;  Laterality: N/A;  . CORONARY/GRAFT ACUTE MI REVASCULARIZATION N/A 03/18/2019   Procedure: Coronary/Graft Acute MI Revascularization;  Surgeon: Nelva Bush, MD;  Location: [facility];  Service: Cardiovascular;  Laterality: N/A;  . IABP INSERTION N/A 07/16/2018   Procedure: IABP Insertion;  Surgeon: Nelva Bush, MD;  Location: [facility];  Service: Cardiovascular;  Laterality: N/A;  . LEFT HEART CATH AND CORONARY ANGIOGRAPHY N/A 07/16/2018   Procedure: LEFT HEART CATH AND CORONARY ANGIOGRAPHY;  Surgeon: Nelva Bush, MD;  Location: [facility];  Service: Cardiovascular;  Laterality: N/A;  . LEFT HEART CATH AND CORONARY ANGIOGRAPHY N/A 03/18/2019   Procedure: LEFT HEART CATH AND CORONARY ANGIOGRAPHY;  Surgeon: Nelva Bush, MD;  Location: [facility];  Service: Cardiovascular;  Laterality: N/A;    Current Meds  Medication Sig  . aspirin EC 81 MG tablet Take 1 tablet (81 mg total) by mouth daily.  [name] atorvastatin (LIPITOR) 80 MG tablet TAKE 1 TABLET BY MOUTH EVERY DAY AT 6 PM  . flavoxATE (URISPAS) 100 MG tablet Take 100 mg by mouth as needed (urinary frequency).  . furosemide (LASIX) 40 MG tablet Take 1 tablet (40 mg) by mouth once a day for 2-3 days until weight down 5 lbs. Then take  1 tablet (40 mg) by mouth daily as needed for swelling.  . nitroGLYCERIN (NITROSTAT) 0.4 MG SL tablet Place 1 tablet (0.4 mg total) under the tongue every 5 (five) minutes x 3 doses as needed for chest pain.    Allergies: Patient has no known allergies.  Social History   Tobacco Use  . Smoking status: Current  Every Day Smoker    Types: Cigars  . Smokeless tobacco: Never Used  Substance Use Topics  . Alcohol use: Yes    Comment: rare  . Drug use: Never    Family History  Problem Relation Age of Onset  . Cancer Mother   . Heart disease Father     Review of Systems: A 12-system review of systems was performed and was negative except as noted in the HPI.  --------------------------------------------------------------------------------------------------  Physical Exam: BP 110/60 (BP Location: Left Arm, Patient Position: Sitting, Cuff Size: Normal)   Pulse 95   Ht 5\' 11"  (1.803 m)   Wt 183 lb 4 oz (83.1 kg)   SpO2 99%   BMI 25.56 kg/m   General:  NAD. Neck: No JVD or HJR. Lungs:  CTA bilaterally w/o wheezes or crackles. Heart: RRR w/o murmurs, rubs, or gallops. Abd: Soft, NT/ND. Ext: No LE edema.  EKG:  NSR with left atrial enlargement, IVCD, and lateral T-wave inversions.  No significant change since prior tracing on 06/11/2019.  Lab Results  Component Value Date   WBC 18.8 (H) 03/20/2019   HGB 18.1 (H) 03/20/2019   HCT 52.9 (H) 03/20/2019   MCV 88.9 03/20/2019   PLT 199 03/20/2019    Lab Results  Component Value Date   NA 138 03/20/2019   K 4.3 03/20/2019   CL 105 03/20/2019   CO2 22 03/20/2019   BUN 23 03/20/2019   CREATININE 1.31 (H) 03/20/2019   GLUCOSE 118 (H) 03/20/2019   ALT 31 03/18/2019    Lab Results  Component Value Date   CHOL 185 03/18/2019   HDL 40 (L) 03/18/2019   LDLCALC 119 (H) 03/18/2019   TRIG 131 03/18/2019   CHOLHDL 4.6 03/18/2019    --------------------------------------------------------------------------------------------------  ASSESSMENT AND PLAN: Coronary artery disease: No symptoms to suggest worsening coronary insufficiency.  We again discussed the importance of remaining complaint with dual antiplatelet therapy, given Nicholas Schultz's history of multiple PCI's and late stent thrombosis involving the proximal LAD in 02/2019 after  prematurely stopping ticagrelor.  He is willing to continue DAPT for at least 12 months from the time of his most recent intervention in 02/2019.  Chronic systolic heart failure: Nicholas Schultz appears euvolemic on exam.  Function status appears to be at baseline but is difficult to assess as Nicholas Schultz admits that he would not admit to having symptoms unless he were in significant distress.  He has been intolerant of beta-blockers and ACEI/ARB's in the past due to soft blood pressure.  We will defer challenging him with either at this time.  I do not think that spironolactone is a good choice either given history of poor medication compliance.  He can continue to use furosemide as needed.  He has declined ICD placement multiple times in the past.  Hyperlipidemia: Continue atorvastatin 80 mg daily with plans to repeat lipid panel when Nicholas Schultz returns for follow-up, as prior statin use was inconsistent.  Follow-up: Return to clinic in 3 months.  Nelva Bush, MD 07/14/2019 2:33 PM

## 2019-07-14 ENCOUNTER — Other Ambulatory Visit: Payer: Self-pay

## 2019-07-14 ENCOUNTER — Ambulatory Visit (INDEPENDENT_AMBULATORY_CARE_PROVIDER_SITE_OTHER): Payer: Medicare HMO | Admitting: Internal Medicine

## 2019-07-14 ENCOUNTER — Encounter: Payer: Self-pay | Admitting: Internal Medicine

## 2019-07-14 VITALS — BP 110/60 | HR 95 | Ht 71.0 in | Wt 183.2 lb

## 2019-07-14 DIAGNOSIS — E785 Hyperlipidemia, unspecified: Secondary | ICD-10-CM

## 2019-07-14 DIAGNOSIS — I2510 Atherosclerotic heart disease of native coronary artery without angina pectoris: Secondary | ICD-10-CM | POA: Diagnosis not present

## 2019-07-14 DIAGNOSIS — I5022 Chronic systolic (congestive) heart failure: Secondary | ICD-10-CM

## 2019-07-14 MED ORDER — TICAGRELOR 90 MG PO TABS: 90.0000 mg | ORAL_TABLET | Freq: Two times a day (BID) | ORAL | 2 refills | Status: AC

## 2019-07-14 NOTE — Patient Instructions (Signed)
Medication Instructions:  Your physician recommends that you continue on your current medications as directed. Please refer to the Current Medication list given to you today. Refill for Brilinta sent to pharmacy.  *If you need a refill on your cardiac medications before your next appointment, please call your pharmacy*  Lab Work: none If you have labs (blood work) drawn today and your tests are completely normal, you will receive your results only by: [name] MyChart Message (if you have MyChart) OR . A paper copy in the mail If you have any lab test that is abnormal or we need to change your treatment, we will call you to review the results.  Testing/Procedures: none  Follow-Up: At [facility], you and your health needs are our priority.  As part of our continuing mission to provide you with exceptional heart care, we have created designated Provider Care Teams.  These Care Teams include your primary Cardiologist (physician) and Advanced Practice Providers (APPs -  Physician Assistants and Nurse Practitioners) who all work together to provide you with the care you need, when you need it.  Your next appointment:   3 month(s)  The format for your next appointment:   In Person  Provider:    You may see Nelva Bush, MD or one of the following Advanced Practice Providers on your designated Care Team:    Murray Hodgkins, NP  Christell Faith, PA-C  Marrianne Mood, PA-C

## 2019-10-15 ENCOUNTER — Ambulatory Visit: Payer: Medicare HMO | Admitting: Internal Medicine

## 2019-10-15 ENCOUNTER — Encounter: Payer: Self-pay | Admitting: Internal Medicine

## 2019-10-15 ENCOUNTER — Other Ambulatory Visit: Payer: Self-pay

## 2019-10-15 VITALS — BP 130/80 | HR 97 | Ht 70.75 in | Wt 168.5 lb

## 2019-10-15 DIAGNOSIS — I2510 Atherosclerotic heart disease of native coronary artery without angina pectoris: Secondary | ICD-10-CM | POA: Diagnosis not present

## 2019-10-15 DIAGNOSIS — E785 Hyperlipidemia, unspecified: Secondary | ICD-10-CM

## 2019-10-15 DIAGNOSIS — I5022 Chronic systolic (congestive) heart failure: Secondary | ICD-10-CM

## 2019-10-15 NOTE — Patient Instructions (Signed)
Medication Instructions:  Your physician recommends that you continue on your current medications as directed. Please refer to the Current Medication list given to you today.  *If you need a refill on your cardiac medications before your next appointment, please call your pharmacy*   Lab Work: Your physician recommends that you return for lab work in: TODAY - LIPID, DIRECT LDL, ALT.   If you have labs (blood work) drawn today and your tests are completely normal, you will receive your results only by: [name] MyChart Message (if you have MyChart) OR . A paper copy in the mail If you have any lab test that is abnormal or we need to change your treatment, we will call you to review the results.   Testing/Procedures: none   Follow-Up: At [facility], you and your health needs are our priority.  As part of our continuing mission to provide you with exceptional heart care, we have created designated Provider Care Teams.  These Care Teams include your primary Cardiologist (physician) and Advanced Practice Providers (APPs -  Physician Assistants and Nurse Practitioners) who all work together to provide you with the care you need, when you need it.  We recommend signing up for the patient portal called "MyChart".  Sign up information is provided on this After Visit Summary.  MyChart is used to connect with patients for Virtual Visits (Telemedicine).  Patients are able to view lab/test results, encounter notes, upcoming appointments, etc.  Non-urgent messages can be sent to your provider as well.   To learn more about what you can do with MyChart, go to [email or web address].    Your next appointment:   6 month(s)  The format for your next appointment:   In Person  Provider:    You may see Nelva Bush, MD or one of the following Advanced Practice Providers on your designated Care Team:    Murray Hodgkins, NP  Christell Faith, PA-C  Marrianne Mood, PA-C

## 2019-10-15 NOTE — Progress Notes (Signed)
Follow-up Outpatient Visit Date: 10/15/2019  Primary Care Provider: Patient, [name] Per No address on file  Chief Complaint: Follow-up coronary artery disease and heart failure  HPI:  Nicholas Schultz is a 74 y.o. male with history of coronary artery disease status post multiple MIs (most recently10/27/2020with PCI to theostialLADin the setting of non-compliance with antiplatelet therapy), chronic systolic heart failure secondary to ischemic cardiomyopathy, hyperlipidemia, and tobacco use, who presents for follow-up of coronary artery disease and heart failure.  I last saw Nicholas Schultz in February, at which time he was feeling well.  He had briefly discontinue ticagrelor again but was agreeable to restarting the medication.  Today, Nicholas Schultz reports feeling well.  His only complaint is of shortness of breath secondary to having to wear a mask.  He has had a few episodes of feeling congested, similar to what he experienced in the past with acute heart failure.  He has taken single doses of furosemide with rapid resolution of his congestion and shortness of breath.  He denies chest pain, palpitations, lightheadedness, and edema.  He is compliant with aspirin, ticagrelor, and atorvastatin.  --------------------------------------------------------------------------------------------------  Past Medical History:  Diagnosis Date  . CAD (coronary artery disease)    a. s/p prior MI and stenting x 4 in [place], [place]; b. 06/2018 Ant STEMI/PCI: LM mod dzs, LAD 50ost, 99p (2.75x16 Synergy DES), 30p/m ISR, LCX nl, OM1 90, OM2 mod dzs, RCA large, 44m ISR, RPDA 70ost, RPAV 50.  [name] HFrEF (heart failure with reduced ejection fraction) ([place])    a. 06/2018 Echo: EF 30-35%.  . Ischemic cardiomyopathy    a. 06/2018 Echo: EF 30-35%, apical septal, mid and apical lateral, apical, apical inf, mid and apical ant, and mid antsept AK w/ inflat HK.   . Tobacco abuse    a. 1 cigar/day. Prev smoked 3ppd cigarettes.   Past  Surgical History:  Procedure Laterality Date  . CORONARY ANGIOPLASTY WITH STENT PLACEMENT    . CORONARY/GRAFT ACUTE MI REVASCULARIZATION N/A 07/16/2018   Procedure: Coronary/Graft Acute MI Revascularization;  Surgeon: Nelva Bush, MD;  Location: [facility];  Service: Cardiovascular;  Laterality: N/A;  . CORONARY/GRAFT ACUTE MI REVASCULARIZATION N/A 03/18/2019   Procedure: Coronary/Graft Acute MI Revascularization;  Surgeon: Nelva Bush, MD;  Location: [facility];  Service: Cardiovascular;  Laterality: N/A;  . IABP INSERTION N/A 07/16/2018   Procedure: IABP Insertion;  Surgeon: Nelva Bush, MD;  Location: [facility];  Service: Cardiovascular;  Laterality: N/A;  . LEFT HEART CATH AND CORONARY ANGIOGRAPHY N/A 07/16/2018   Procedure: LEFT HEART CATH AND CORONARY ANGIOGRAPHY;  Surgeon: Nelva Bush, MD;  Location: [facility];  Service: Cardiovascular;  Laterality: N/A;  . LEFT HEART CATH AND CORONARY ANGIOGRAPHY N/A 03/18/2019   Procedure: LEFT HEART CATH AND CORONARY ANGIOGRAPHY;  Surgeon: Nelva Bush, MD;  Location: [facility];  Service: Cardiovascular;  Laterality: N/A;    Current Meds  Medication Sig  . aspirin EC 81 MG tablet Take 1 tablet (81 mg total) by mouth daily.  [name] atorvastatin (LIPITOR) 80 MG tablet TAKE 1 TABLET BY MOUTH EVERY DAY AT 6 PM  . flavoxATE (URISPAS) 100 MG tablet Take 100 mg by mouth as needed (urinary frequency).  . furosemide (LASIX) 40 MG tablet Take 1 tablet (40 mg) by mouth once a day for 2-3 days until weight down 5 lbs. Then take 1 tablet (40 mg) by mouth daily as needed for swelling.  . nitroGLYCERIN (NITROSTAT)  0.4 MG SL tablet Place 1 tablet (0.4 mg total) under the tongue every 5 (five) minutes x 3 doses as needed for chest pain.  . ticagrelor (BRILINTA) 90 MG TABS tablet Take 1 tablet (90 mg total) by mouth 2 (two) times daily.    Allergies: Patient has no known allergies.  Social  History   Tobacco Use  . Smoking status: Current Every Day Smoker    Types: Cigars  . Smokeless tobacco: Never Used  . Tobacco comment: 1 per day  Substance Use Topics  . Alcohol use: Yes    Comment: rare  . Drug use: Never    Family History  Problem Relation Age of Onset  . Cancer Mother   . Heart disease Father     Review of Systems: A 12-system review of systems was performed and was negative except as noted in the HPI.  --------------------------------------------------------------------------------------------------  Physical Exam: BP 130/80 (BP Location: Left Arm, Patient Position: Sitting, Cuff Size: Normal)   Pulse 97   Ht 5' 10.75" (1.797 m)   Wt 168 lb 8 oz (76.4 kg)   SpO2 98%   BMI 23.67 kg/m   General: NAD. Neck: No JVD or HJR. Lungs: Mildly diminished breath sounds throughout without wheezes or crackles. Heart: Regular rate and rhythm without murmurs, rubs, or gallops. Abdomen: Soft, nontender, nondistended. Extremities: No lower extremity edema.  EKG: Sinus rhythm with nonspecific intraventricular conduction delay and lateral T wave inversions.  Lateral T wave inversions are slightly more pronounced than on prior tracing from 07/14/2019.  Otherwise, there has been no significant interval change.  Lab Results  Component Value Date   WBC 18.8 (H) 03/20/2019   HGB 18.1 (H) 03/20/2019   HCT 52.9 (H) 03/20/2019   MCV 88.9 03/20/2019   PLT 199 03/20/2019    Lab Results  Component Value Date   NA 138 03/20/2019   K 4.3 03/20/2019   CL 105 03/20/2019   CO2 22 03/20/2019   BUN 23 03/20/2019   CREATININE 1.31 (H) 03/20/2019   GLUCOSE 118 (H) 03/20/2019   ALT 31 03/18/2019    Lab Results  Component Value Date   CHOL 185 03/18/2019   HDL 40 (L) 03/18/2019   LDLCALC 119 (H) 03/18/2019   TRIG 131 03/18/2019   CHOLHDL 4.6 03/18/2019    --------------------------------------------------------------------------------------------------  ASSESSMENT  AND PLAN: Coronary artery disease: Nicholas Schultz has not had any recurrent angina.  We will continue current medications for secondary prevention, including at least 12 months of aspirin and ticagrelor from his most recent MI in 02/2019.  Given multiple stents and prior stent thrombosis, I would favor indefinite dual antiplatelet therapy if Nicholas Schultz is agreeable.  Chronic systolic heart failure due to ischemic cardiomyopathy: Nicholas Schultz appears euvolemic with NYHA class II symptoms.  He declines retrial of a beta-blocker and ACE inhibitor/ARB.  We will continue as needed furosemide.  He has previously declined ICD placement.  Hyperlipidemia: Continue atorvastatin 80 mg daily.  I will check a lipid panel and ALT today.  Follow-up: Return to clinic in 6 months.  Nelva Bush, MD 10/15/2019 1:36 PM

## 2019-10-16 LAB — LIPID PANEL
Chol/HDL Ratio: 5.9 ratio — ABNORMAL HIGH (ref 0.0–5.0)
Cholesterol, Total: 202 mg/dL — ABNORMAL HIGH (ref 100–199)
HDL: 34 mg/dL — ABNORMAL LOW (ref >39)
LDL Chol Calc (NIH): 146 mg/dL — ABNORMAL HIGH (ref 0–99)
Triglycerides: 121 mg/dL (ref 0–149)
VLDL Cholesterol Cal: 22 mg/dL (ref 5–40)

## 2019-10-16 LAB — LDL CHOLESTEROL, DIRECT: LDL Direct: 140 mg/dL — ABNORMAL HIGH (ref 0–99)

## 2019-10-16 LAB — ALT: ALT: 14 IU/L (ref 0–44)

## 2019-10-17 ENCOUNTER — Telehealth: Payer: Self-pay

## 2019-10-17 ENCOUNTER — Encounter: Payer: Self-pay | Admitting: Internal Medicine

## 2019-10-17 NOTE — Telephone Encounter (Signed)
spoke to patient to review labs.   Pt reports that he has not been consistently taking cholesterol medications and has since appt started back taking regularly.    Pt verbalized understanding and has no further questions at this time.    Advised pt to call for any further questions or concerns.  No further orders.

## 2019-10-17 NOTE — Telephone Encounter (Signed)
Attempted to call patient. LMTCB 10/17/2019

## 2019-10-17 NOTE — Telephone Encounter (Signed)
-----   Message from Nelva Bush, MD sent at 10/17/2019 12:50 PM EDT ----- Please let Mr. Licata know that his cholesterol is not well controlled with an LDL of 146.  Please confirm that he is taking atorvastatin 80 mg daily, and if not, he should be encouraged to do so regularly.

## 2020-02-09 ENCOUNTER — Inpatient Hospital Stay
Admission: EM | Admit: 2020-02-09 | Discharge: 2020-03-22 | DRG: 871 | Disposition: E | Payer: Medicare HMO | Attending: Family Medicine | Admitting: Family Medicine

## 2020-02-09 ENCOUNTER — Telehealth: Payer: Self-pay | Admitting: Internal Medicine

## 2020-02-09 ENCOUNTER — Other Ambulatory Visit: Payer: Self-pay

## 2020-02-09 ENCOUNTER — Emergency Department: Payer: Medicare HMO

## 2020-02-09 ENCOUNTER — Encounter: Payer: Self-pay | Admitting: Emergency Medicine

## 2020-02-09 ENCOUNTER — Inpatient Hospital Stay: Payer: Medicare HMO

## 2020-02-09 DIAGNOSIS — E875 Hyperkalemia: Secondary | ICD-10-CM | POA: Diagnosis present

## 2020-02-09 DIAGNOSIS — R54 Age-related physical debility: Secondary | ICD-10-CM | POA: Diagnosis not present

## 2020-02-09 DIAGNOSIS — Z7189 Other specified counseling: Secondary | ICD-10-CM

## 2020-02-09 DIAGNOSIS — J1282 Pneumonia due to coronavirus disease 2019: Secondary | ICD-10-CM | POA: Diagnosis present

## 2020-02-09 DIAGNOSIS — D638 Anemia in other chronic diseases classified elsewhere: Secondary | ICD-10-CM | POA: Diagnosis present

## 2020-02-09 DIAGNOSIS — F17290 Nicotine dependence, other tobacco product, uncomplicated: Secondary | ICD-10-CM | POA: Diagnosis present

## 2020-02-09 DIAGNOSIS — Z809 Family history of malignant neoplasm, unspecified: Secondary | ICD-10-CM | POA: Diagnosis not present

## 2020-02-09 DIAGNOSIS — R0902 Hypoxemia: Secondary | ICD-10-CM

## 2020-02-09 DIAGNOSIS — A4189 Other specified sepsis: Secondary | ICD-10-CM | POA: Diagnosis present

## 2020-02-09 DIAGNOSIS — Z515 Encounter for palliative care: Secondary | ICD-10-CM

## 2020-02-09 DIAGNOSIS — I252 Old myocardial infarction: Secondary | ICD-10-CM

## 2020-02-09 DIAGNOSIS — Z6820 Body mass index (BMI) 20.0-20.9, adult: Secondary | ICD-10-CM

## 2020-02-09 DIAGNOSIS — Z66 Do not resuscitate: Secondary | ICD-10-CM | POA: Diagnosis not present

## 2020-02-09 DIAGNOSIS — J9601 Acute respiratory failure with hypoxia: Secondary | ICD-10-CM | POA: Diagnosis present

## 2020-02-09 DIAGNOSIS — I2510 Atherosclerotic heart disease of native coronary artery without angina pectoris: Secondary | ICD-10-CM | POA: Diagnosis present

## 2020-02-09 DIAGNOSIS — J9600 Acute respiratory failure, unspecified whether with hypoxia or hypercapnia: Secondary | ICD-10-CM | POA: Diagnosis not present

## 2020-02-09 DIAGNOSIS — Z8249 Family history of ischemic heart disease and other diseases of the circulatory system: Secondary | ICD-10-CM

## 2020-02-09 DIAGNOSIS — Z79899 Other long term (current) drug therapy: Secondary | ICD-10-CM | POA: Diagnosis not present

## 2020-02-09 DIAGNOSIS — A419 Sepsis, unspecified organism: Secondary | ICD-10-CM

## 2020-02-09 DIAGNOSIS — R6520 Severe sepsis without septic shock: Secondary | ICD-10-CM | POA: Diagnosis present

## 2020-02-09 DIAGNOSIS — I509 Heart failure, unspecified: Secondary | ICD-10-CM

## 2020-02-09 DIAGNOSIS — Z955 Presence of coronary angioplasty implant and graft: Secondary | ICD-10-CM

## 2020-02-09 DIAGNOSIS — Z7902 Long term (current) use of antithrombotics/antiplatelets: Secondary | ICD-10-CM

## 2020-02-09 DIAGNOSIS — E43 Unspecified severe protein-calorie malnutrition: Secondary | ICD-10-CM | POA: Diagnosis present

## 2020-02-09 DIAGNOSIS — E785 Hyperlipidemia, unspecified: Secondary | ICD-10-CM | POA: Diagnosis present

## 2020-02-09 DIAGNOSIS — E872 Acidosis, unspecified: Secondary | ICD-10-CM | POA: Clinically undetermined

## 2020-02-09 DIAGNOSIS — I255 Ischemic cardiomyopathy: Secondary | ICD-10-CM | POA: Diagnosis present

## 2020-02-09 DIAGNOSIS — R7989 Other specified abnormal findings of blood chemistry: Secondary | ICD-10-CM

## 2020-02-09 DIAGNOSIS — I447 Left bundle-branch block, unspecified: Secondary | ICD-10-CM | POA: Diagnosis present

## 2020-02-09 DIAGNOSIS — Z7982 Long term (current) use of aspirin: Secondary | ICD-10-CM | POA: Diagnosis not present

## 2020-02-09 DIAGNOSIS — J9691 Respiratory failure, unspecified with hypoxia: Secondary | ICD-10-CM

## 2020-02-09 DIAGNOSIS — I480 Paroxysmal atrial fibrillation: Secondary | ICD-10-CM | POA: Diagnosis not present

## 2020-02-09 DIAGNOSIS — I5022 Chronic systolic (congestive) heart failure: Secondary | ICD-10-CM | POA: Diagnosis present

## 2020-02-09 DIAGNOSIS — R451 Restlessness and agitation: Secondary | ICD-10-CM | POA: Diagnosis not present

## 2020-02-09 DIAGNOSIS — U071 COVID-19: Secondary | ICD-10-CM | POA: Diagnosis present

## 2020-02-09 DIAGNOSIS — R0602 Shortness of breath: Secondary | ICD-10-CM | POA: Diagnosis present

## 2020-02-09 LAB — COMPREHENSIVE METABOLIC PANEL
ALT: 35 U/L (ref 0–44)
AST: 80 U/L — ABNORMAL HIGH (ref 15–41)
Albumin: 2.9 g/dL — ABNORMAL LOW (ref 3.5–5.0)
Alkaline Phosphatase: 129 U/L — ABNORMAL HIGH (ref 38–126)
Anion gap: 15 (ref 5–15)
BUN: 35 mg/dL — ABNORMAL HIGH (ref 8–23)
CO2: 21 mmol/L — ABNORMAL LOW (ref 22–32)
Calcium: 8.4 mg/dL — ABNORMAL LOW (ref 8.9–10.3)
Chloride: 103 mmol/L (ref 98–111)
Creatinine, Ser: 1.35 mg/dL — ABNORMAL HIGH (ref 0.61–1.24)
GFR calc Af Amer: 59 mL/min — ABNORMAL LOW (ref >60)
GFR calc non Af Amer: 51 mL/min — ABNORMAL LOW (ref >60)
Glucose, Bld: 101 mg/dL — ABNORMAL HIGH (ref 70–99)
Potassium: 3.9 mmol/L (ref 3.5–5.1)
Sodium: 139 mmol/L (ref 135–145)
Total Bilirubin: 2.2 mg/dL — ABNORMAL HIGH (ref 0.3–1.2)
Total Protein: 6.6 g/dL (ref 6.5–8.1)

## 2020-02-09 LAB — BRAIN NATRIURETIC PEPTIDE: B Natriuretic Peptide: 1253 pg/mL — ABNORMAL HIGH (ref 0.0–100.0)

## 2020-02-09 LAB — CBC WITH DIFFERENTIAL/PLATELET
Abs Immature Granulocytes: 0.13 10*3/uL — ABNORMAL HIGH (ref 0.00–0.07)
Basophils Absolute: 0 10*3/uL (ref 0.0–0.1)
Basophils Relative: 0 %
Eosinophils Absolute: 0 10*3/uL (ref 0.0–0.5)
Eosinophils Relative: 0 %
HCT: 44.5 % (ref 39.0–52.0)
Hemoglobin: 15.1 g/dL (ref 13.0–17.0)
Immature Granulocytes: 1 %
Lymphocytes Relative: 9 %
Lymphs Abs: 1 10*3/uL (ref 0.7–4.0)
MCH: 28.5 pg (ref 26.0–34.0)
MCHC: 33.9 g/dL (ref 30.0–36.0)
MCV: 84.1 fL (ref 80.0–100.0)
Monocytes Absolute: 0.3 10*3/uL (ref 0.1–1.0)
Monocytes Relative: 3 %
Neutro Abs: 10.1 10*3/uL — ABNORMAL HIGH (ref 1.7–7.7)
Neutrophils Relative %: 87 %
Platelets: 172 10*3/uL (ref 150–400)
RBC: 5.29 MIL/uL (ref 4.22–5.81)
RDW: 16.8 % — ABNORMAL HIGH (ref 11.5–15.5)
Smear Review: NORMAL
WBC: 12.1 10*3/uL — ABNORMAL HIGH (ref 4.0–10.5)
nRBC: 0 % (ref 0.0–0.2)

## 2020-02-09 LAB — FIBRINOGEN: Fibrinogen: 144 mg/dL — ABNORMAL LOW (ref 210–475)

## 2020-02-09 LAB — LACTIC ACID, PLASMA: Lactic Acid, Venous: 4 mmol/L (ref 0.5–1.9)

## 2020-02-09 LAB — FIBRIN DERIVATIVES D-DIMER (ARMC ONLY): Fibrin derivatives D-dimer (ARMC): >7500 ng/mL (FEU) — ABNORMAL HIGH (ref 0.00–499.00)

## 2020-02-09 LAB — FERRITIN: Ferritin: 3623 ng/mL — ABNORMAL HIGH (ref 24–336)

## 2020-02-09 LAB — TROPONIN I (HIGH SENSITIVITY): Troponin I (High Sensitivity): 38 ng/L — ABNORMAL HIGH (ref <18)

## 2020-02-09 LAB — PROCALCITONIN: Procalcitonin: 0.18 ng/mL

## 2020-02-09 LAB — SARS CORONAVIRUS 2 BY RT PCR (HOSPITAL ORDER, PERFORMED IN ~~LOC~~ HOSPITAL LAB): SARS Coronavirus 2: POSITIVE — AB

## 2020-02-09 LAB — TRIGLYCERIDES: Triglycerides: 177 mg/dL — ABNORMAL HIGH (ref <150)

## 2020-02-09 LAB — LACTATE DEHYDROGENASE: LDH: 1100 U/L — ABNORMAL HIGH (ref 98–192)

## 2020-02-09 LAB — C-REACTIVE PROTEIN: CRP: 8.3 mg/dL — ABNORMAL HIGH (ref <1.0)

## 2020-02-09 MED ORDER — ACETAMINOPHEN 325 MG PO TABS: 650.0000 mg | ORAL_TABLET | Freq: Four times a day (QID) | ORAL | Status: DC | PRN

## 2020-02-09 MED ORDER — ZINC SULFATE 220 (50 ZN) MG PO CAPS
220.0000 mg | ORAL_CAPSULE | Freq: Every day | ORAL | Status: DC
  Administered 2020-02-09 – 2020-02-21 (×13): 220 mg via ORAL
  Filled 2020-02-09 (×13): qty 1

## 2020-02-09 MED ORDER — PREDNISONE 50 MG PO TABS
50.0000 mg | ORAL_TABLET | Freq: Every day | ORAL | Status: DC
  Administered 2020-02-13 – 2020-02-21 (×9): 50 mg via ORAL
  Filled 2020-02-09 (×11): qty 1

## 2020-02-09 MED ORDER — IOHEXOL 350 MG/ML SOLN
75.0000 mL | Freq: Once | INTRAVENOUS | Status: AC | PRN
  Administered 2020-02-09: 75 mL via INTRAVENOUS

## 2020-02-09 MED ORDER — SODIUM CHLORIDE 0.9 % IV SOLN: 250.0000 mL | INTRAVENOUS | Status: DC | PRN

## 2020-02-09 MED ORDER — ASCORBIC ACID 500 MG PO TABS
500.0000 mg | ORAL_TABLET | Freq: Every day | ORAL | Status: DC
  Administered 2020-02-09 – 2020-02-21 (×13): 500 mg via ORAL
  Filled 2020-02-09 (×14): qty 1

## 2020-02-09 MED ORDER — ONDANSETRON HCL 4 MG/2ML IJ SOLN: 4.0000 mg | Freq: Four times a day (QID) | INTRAMUSCULAR | Status: DC | PRN

## 2020-02-09 MED ORDER — DEXAMETHASONE SODIUM PHOSPHATE 10 MG/ML IJ SOLN
10.0000 mg | Freq: Once | INTRAMUSCULAR | Status: AC
  Administered 2020-02-09: 10 mg via INTRAVENOUS
  Filled 2020-02-09: qty 1

## 2020-02-09 MED ORDER — ALBUTEROL SULFATE HFA 108 (90 BASE) MCG/ACT IN AERS
2.0000 | INHALATION_SPRAY | Freq: Four times a day (QID) | RESPIRATORY_TRACT | Status: DC
  Administered 2020-02-09 – 2020-02-22 (×46): 2 via RESPIRATORY_TRACT
  Filled 2020-02-09 (×2): qty 6.7

## 2020-02-09 MED ORDER — GUAIFENESIN-DM 100-10 MG/5ML PO SYRP
10.0000 mL | ORAL_SOLUTION | ORAL | Status: DC | PRN
  Filled 2020-02-09: qty 10

## 2020-02-09 MED ORDER — FLAVOXATE HCL 100 MG PO TABS: 100.0000 mg | ORAL_TABLET | ORAL | Status: DC | PRN

## 2020-02-09 MED ORDER — ONDANSETRON HCL 4 MG PO TABS: 4.0000 mg | ORAL_TABLET | Freq: Four times a day (QID) | ORAL | Status: DC | PRN

## 2020-02-09 MED ORDER — LACTATED RINGERS IV BOLUS
1000.0000 mL | Freq: Once | INTRAVENOUS | Status: AC
  Administered 2020-02-09: 1000 mL via INTRAVENOUS

## 2020-02-09 MED ORDER — SODIUM CHLORIDE 0.9 % IV SOLN
100.0000 mg | Freq: Every day | INTRAVENOUS | Status: AC
  Administered 2020-02-10 – 2020-02-13 (×4): 100 mg via INTRAVENOUS
  Filled 2020-02-09 (×2): qty 20
  Filled 2020-02-09: qty 100
  Filled 2020-02-09 (×2): qty 20

## 2020-02-09 MED ORDER — NITROGLYCERIN 0.4 MG SL SUBL: 0.4000 mg | SUBLINGUAL_TABLET | SUBLINGUAL | Status: DC | PRN

## 2020-02-09 MED ORDER — ASPIRIN EC 81 MG PO TBEC
81.0000 mg | DELAYED_RELEASE_TABLET | Freq: Every day | ORAL | Status: DC
  Administered 2020-02-09 – 2020-02-21 (×13): 81 mg via ORAL
  Filled 2020-02-09 (×14): qty 1

## 2020-02-09 MED ORDER — ATORVASTATIN CALCIUM 80 MG PO TABS
80.0000 mg | ORAL_TABLET | Freq: Every day | ORAL | Status: DC
  Administered 2020-02-09 – 2020-02-13 (×5): 80 mg via ORAL
  Filled 2020-02-09 (×4): qty 4

## 2020-02-09 MED ORDER — ENOXAPARIN SODIUM 40 MG/0.4ML ~~LOC~~ SOLN
40.0000 mg | SUBCUTANEOUS | Status: DC
  Administered 2020-02-09: 21:00:00 40 mg via SUBCUTANEOUS
  Filled 2020-02-09: qty 0.4

## 2020-02-09 MED ORDER — SODIUM CHLORIDE 0.9 % IV SOLN
200.0000 mg | Freq: Once | INTRAVENOUS | Status: AC
  Administered 2020-02-09: 200 mg via INTRAVENOUS
  Filled 2020-02-09: qty 200

## 2020-02-09 MED ORDER — SODIUM CHLORIDE 0.9% FLUSH
3.0000 mL | INTRAVENOUS | Status: DC | PRN
  Administered 2020-02-20: 3 mL via INTRAVENOUS

## 2020-02-09 MED ORDER — SODIUM CHLORIDE 0.9% FLUSH
3.0000 mL | Freq: Two times a day (BID) | INTRAVENOUS | Status: DC
  Administered 2020-02-09 – 2020-02-23 (×29): 3 mL via INTRAVENOUS

## 2020-02-09 MED ORDER — METHYLPREDNISOLONE SODIUM SUCC 40 MG IJ SOLR
0.5000 mg/kg | Freq: Two times a day (BID) | INTRAMUSCULAR | Status: AC
  Administered 2020-02-10 – 2020-02-12 (×6): 38.4 mg via INTRAVENOUS
  Filled 2020-02-09 (×6): qty 1

## 2020-02-09 MED ORDER — TICAGRELOR 90 MG PO TABS
90.0000 mg | ORAL_TABLET | Freq: Two times a day (BID) | ORAL | Status: DC
  Administered 2020-02-09 – 2020-02-21 (×25): 90 mg via ORAL
  Filled 2020-02-09 (×29): qty 1

## 2020-02-09 NOTE — Consult Note (Signed)
Remdesivir - Pharmacy Brief Note  A/P:  Remdesivir 200 mg IVPB once followed by 100 mg IVPB daily x 4 days.   Kristeen Miss, PharmD Clinical Pharmacist 01/21/2020 4:25 PM

## 2020-02-09 NOTE — ED Notes (Signed)
Pt placed on high flow [place] by RT

## 2020-02-09 NOTE — Telephone Encounter (Addendum)
Spoke with the patients wife. The patient tested positive for COVID >10 days ago. The patient does not have a pcp. Patient wife sts that she has been keeping track of the patients O2 saturation. His O2 sat has not gotten above 90% today and is averaging 86%.   Advised the pt wife that he should be evaluetd at a urgent care or  in the ED asap. Patient wife sts that she called the ED and was told that the patient would have to be transported to the ED by EMT due to his positive status. Advised her to go ahead and d/c this call and call emergency services to transport the patient to the ED.  Patients wife agreeable with the plan and voiced appreciation for the assistance.

## 2020-02-09 NOTE — ED Triage Notes (Addendum)
Arrived on NRB with sats 78 RA per EMS. sats 75% NA 6 L.  Placed back on NRB, at 47.  Has had SHOB, pt unsure how long.  No fever. Daughter had covid. sats now 90% NRB

## 2020-02-09 NOTE — ED Provider Notes (Signed)
-----------------------------------------   2:59 PM on 02/04/2020 -----------------------------------------  Blood pressure 92/61, pulse 62, temperature 98.1 F (36.7 C), temperature source Axillary, resp. rate (!) 30, height 5\' 11"  (1.803 m), weight 77.1 kg, SpO2 97 %.  Assuming care from Dr. Corky Downs.  In short, Nicholas Schultz is a 74 y.o. male with a chief complaint of Shortness of Breath .  Refer to the original H&P for additional details.  The current plan of care is to follow-up labs and imaging for SOB and suspected COVID.  ----------------------------------------- 4:12 PM on 02/05/2020 -----------------------------------------  Patient remains stable from respiratory perspective on high flow nasal cannula.  His blood pressure has been borderline low but with MAP greater than 65 throughout.  I suspect there is an element of dehydration contributing to this given his elevated BUN to creatinine ratio.  He has now tested positive for COVID-19 and I doubt sepsis given reassuring procalcitonin.  Patient was given IV steroids and we will hydrate with IV fluids, continue to monitor blood pressure.  Case discussed with hospitalist for admission.    Blake Divine, MD 01/28/2020 [phone]

## 2020-02-09 NOTE — ED Notes (Signed)
Resumed care from  [employer].   This rn and md walked into the room to find high flow oxygen off pt,  Monitor off.  Pt informed he needs  The oxygen and monitor.,     Lab called with positive covid results.  Pt aware.

## 2020-02-09 NOTE — H&P (Signed)
History and Physical    Nicholas Schultz XLK:[id number] DOB: 1945-08-06 DOA: 01/27/2020  PCP: Patient, [name] Per   Patient coming from: Home  I have personally briefly reviewed patient's old medical records in [place]  Chief Complaint: Shortness of breath  HPI: Nicholas Schultz is a 75 y.o. male with medical history significant for coronary artery disease, heart failure with reduced EF (last known LVEF 30 to 35%), history of ischemic cardiomyopathy, nicotine dependence who presents to the emergency room for evaluation of worsening shortness of breath.  Patient was recently exposed to a family member who had COVID-19 infection and had a positive Covid PCR test about 10 days ago.   He is unvaccinated.   His wife has been checking his pulse oximetry at home and today noted that his pulse oximetry had been about 86% which prompted patient's visit to the emergency room for further evaluation. He complains of a fever and chills as well as myalgias and weakness but denies having any cough. He has nausea but no vomiting.  He denies having any chest pain, no abdominal pain, no dizziness, no lightheadedness, no urinary symptoms or changes in his bowel habits. Labs show sodium 139, potassium 3.9, chloride 103, bicarb 21, BUN 101, BUN 35, creatinine 1.35, calcium 8.4, alkaline phosphatase 129, ALT 35, AST 80, total protein 6.6, LDH 1100, troponin 38, ferritin 3623, lactic acid 4.0, procalcitonin 0.18, white count 12.1, hemoglobin 15.1, hematocrit 44.5, MCV 84.1, RDW 16.8, fibrin derivatives 7,5000 Chest x-ray reviewed by me shows bilateral interstitial opacities. Lead EKG shows sinus tachycardia, PVCs and left bundle branch block    ED Course: Patient is a 74 year old male with a history of ischemic cardiomyopathy, chronic systolic heart failure with last known LVEF of 30 to 35% who presents to the emergency room via EMS for evaluation of hypoxia.  Patient had a positive Covid test about 10 days ago and  has been checking his pulse oximetry at home.  Patient noted to have room air pulse oximetry of 86% prompting a visit to the emergency room.  Patient has been hypotensive with systolic blood pressure in the 80s but is awake and alert.  He received 1 L IV fluid hydration in the ER with improvement in his blood pressure.  He also received remdesivir and prednisone.  He is currently on high flow oxygen and will be admitted to the hospital for further evaluation.  Review of Systems: As per HPI otherwise 10 point review of systems negative.    Past Medical History:  Diagnosis Date  . CAD (coronary artery disease)    a. s/p prior MI and stenting x 4 in [place], [place]; b. 06/2018 Ant STEMI/PCI: LM mod dzs, LAD 50ost, 99p (2.75x16 Synergy DES), 30p/m ISR, LCX nl, OM1 90, OM2 mod dzs, RCA large, 3m ISR, RPDA 70ost, RPAV 50.  [name] HFrEF (heart failure with reduced ejection fraction) ([place])    a. 06/2018 Echo: EF 30-35%.  . Ischemic cardiomyopathy    a. 06/2018 Echo: EF 30-35%, apical septal, mid and apical lateral, apical, apical inf, mid and apical ant, and mid antsept AK w/ inflat HK.   . Tobacco abuse    a. 1 cigar/day. Prev smoked 3ppd cigarettes.    Past Surgical History:  Procedure Laterality Date  . CORONARY ANGIOPLASTY WITH STENT PLACEMENT    . CORONARY/GRAFT ACUTE MI REVASCULARIZATION N/A 07/16/2018   Procedure: Coronary/Graft Acute MI Revascularization;  Surgeon: Nelva Bush, MD;  Location: [place] [facility];  Service: Cardiovascular;  Laterality: N/A;  . CORONARY/GRAFT ACUTE MI REVASCULARIZATION N/A 03/18/2019   Procedure: Coronary/Graft Acute MI Revascularization;  Surgeon: Nelva Bush, MD;  Location: [place] [facility];  Service: Cardiovascular;  Laterality: N/A;  . IABP INSERTION N/A 07/16/2018   Procedure: IABP Insertion;  Surgeon: Nelva Bush, MD;  Location: [place] [facility];  Service: Cardiovascular;  Laterality: N/A;  . LEFT HEART CATH AND CORONARY ANGIOGRAPHY N/A  07/16/2018   Procedure: LEFT HEART CATH AND CORONARY ANGIOGRAPHY;  Surgeon: Nelva Bush, MD;  Location: [place] [facility];  Service: Cardiovascular;  Laterality: N/A;  . LEFT HEART CATH AND CORONARY ANGIOGRAPHY N/A 03/18/2019   Procedure: LEFT HEART CATH AND CORONARY ANGIOGRAPHY;  Surgeon: Nelva Bush, MD;  Location: [place] [facility];  Service: Cardiovascular;  Laterality: N/A;     reports that he has been smoking cigars. He has never used smokeless tobacco. He reports current alcohol use. He reports that he does not use drugs.  No Known Allergies  Family History  Problem Relation Age of Onset  . Cancer Mother   . Heart disease Father      Prior to Admission medications   Medication Sig Start Date End Date Taking? Authorizing Provider  aspirin EC 81 MG tablet Take 1 tablet (81 mg total) by mouth daily. 07/19/18  Yes Simmons, Brittainy M, PA-C  atorvastatin (LIPITOR) 80 MG tablet TAKE 1 TABLET BY MOUTH EVERY DAY AT 6 PM 05/26/19  Yes End, Harrell Gave, MD  flavoxATE (URISPAS) 100 MG tablet Take 100 mg by mouth as needed (urinary frequency).   Yes [provider]  furosemide (LASIX) 40 MG tablet Take 1 tablet (40 mg) by mouth once a day for 2-3 days until weight down 5 lbs. Then take 1 tablet (40 mg) by mouth daily as needed for swelling. 05/22/19  Yes End, Harrell Gave, MD  nitroGLYCERIN (NITROSTAT) 0.4 MG SL tablet Place 1 tablet (0.4 mg total) under the tongue every 5 (five) minutes x 3 doses as needed for chest pain. 07/19/18  Yes Lyda Jester M, PA-C  ticagrelor (BRILINTA) 90 MG TABS tablet Take 1 tablet (90 mg total) by mouth 2 (two) times daily. 07/14/19  Yes End, Harrell Gave, MD    Physical Exam: Vitals:   01/28/2020 1530 02/11/2020 1545 01/26/2020 1600 02/12/2020 1615  BP: (!) 86/66  (!) 77/65 90/63  Pulse: 70 71 83 92  Resp: 18 19 (!) 22 14  Temp:      TempSrc:      SpO2: 100% 100% 99% 99%  Weight:      Height:         Vitals:   01/29/2020 1530 02/10/2020  1545 01/27/2020 1600 02/19/2020 1615  BP: (!) 86/66  (!) 77/65 90/63  Pulse: 70 71 83 92  Resp: 18 19 (!) 22 14  Temp:      TempSrc:      SpO2: 100% 100% 99% 99%  Weight:      Height:        Constitutional: NAD, alert and oriented x 3.  Chronically ill-appearing.  Appears to be in mild to moderate respiratory distress Eyes: PERRL, lids and conjunctivae pallor ENMT: Mucous membranes are moist.  Neck: normal, supple, no masses, no thyromegaly Respiratory: Bilateral air entry, no wheezing, no crackles. Normal respiratory effort. No accessory muscle use.  Cardiovascular: Tachycardic, no murmurs / rubs / gallops. No extremity edema. 2+ pedal pulses. No carotid bruits.  Abdomen: no tenderness, no masses palpated. No hepatosplenomegaly. Bowel sounds positive.  Musculoskeletal: no  clubbing / cyanosis. No joint deformity upper and lower extremities.  Skin: no rashes, lesions, ulcers.  Neurologic: No gross focal neurologic deficit. Psychiatric: Normal mood and affect.   Labs on Admission: I have personally reviewed following labs and imaging studies  CBC: Recent Labs  Lab 02/16/2020 1347  WBC 12.1*  NEUTROABS 10.1*  HGB 15.1  HCT 44.5  MCV 84.1  PLT 480   Basic Metabolic Panel: Recent Labs  Lab 02/07/2020 1347  NA 139  K 3.9  CL 103  CO2 21*  GLUCOSE 101*  BUN 35*  CREATININE 1.35*  CALCIUM 8.4*   GFR: Estimated Creatinine Clearance: 50.4 mL/min (A) (by C-G formula based on SCr of 1.35 mg/dL (H)). Liver Function Tests: Recent Labs  Lab 01/31/2020 1347  AST 80*  ALT 35  ALKPHOS 129*  BILITOT 2.2*  PROT 6.6  ALBUMIN 2.9*   No results for input(s): LIPASE, AMYLASE in the last 168 hours. No results for input(s): AMMONIA in the last 168 hours. Coagulation Profile: No results for input(s): INR, PROTIME in the last 168 hours. Cardiac Enzymes: No results for input(s): CKTOTAL, CKMB, CKMBINDEX, TROPONINI in the last 168 hours. BNP (last 3 results) No results for input(s):  PROBNP in the last 8760 hours. HbA1C: No results for input(s): HGBA1C in the last 72 hours. CBG: No results for input(s): GLUCAP in the last 168 hours. Lipid Profile: Recent Labs    01/21/2020 1347  TRIG 177*   Thyroid Function Tests: No results for input(s): TSH, T4TOTAL, FREET4, T3FREE, THYROIDAB in the last 72 hours. Anemia Panel: Recent Labs    02/14/2020 1347  FERRITIN 3,623*   Urine analysis:    Component Value Date/Time   COLORURINE YELLOW (A) 03/20/2019 0946   APPEARANCEUR CLEAR (A) 03/20/2019 0946   LABSPEC 1.025 03/20/2019 0946   PHURINE 5.0 03/20/2019 0946   GLUCOSEU NEGATIVE 03/20/2019 0946   HGBUR SMALL (A) 03/20/2019 0946   BILIRUBINUR NEGATIVE 03/20/2019 0946   KETONESUR 5 (A) 03/20/2019 0946   PROTEINUR NEGATIVE 03/20/2019 0946   NITRITE NEGATIVE 03/20/2019 0946   LEUKOCYTESUR NEGATIVE 03/20/2019 0946    Radiological Exams on Admission: DG Chest Port 1 View  Result Date: 02/16/2020 CLINICAL DATA:  Shortness of breath with hypoxemia.  No fever. EXAM: PORTABLE CHEST 1 VIEW COMPARISON:  Radiographs 03/20/2019 and 07/17/2018. FINDINGS: 1355 hours. Two views were obtained. The heart size and mediastinal contours are stable. Diffuse interstitial opacities are again noted in both lungs with an asymmetric component in the right upper lobe. Compared with the prior study, the current findings are less severe. There is no confluent airspace opacity, significant pleural effusion or pneumothorax. The bones appear intact. IMPRESSION: Overall improved aeration of both lungs with persistent/recurrent diffuse interstitial opacities, most pronounced in the right upper lobe. Findings are most consistent with atypical edema or atypical infection. There could be a component of underlying fibrosis, and follow-up until clearing recommended. Electronically Signed   By: Richardean Sale M.D.   On: 02/07/2020 14:15    EKG: Independently reviewed.  Sinus tachycardia PVCs with left bundle  branch block  Assessment/Plan Principal Problem:   Pneumonia due to COVID-19 virus Active Problems:   Ischemic cardiomyopathy   Chronic systolic heart failure (HCC)   Tobacco abuse   Acute respiratory failure due to COVID-19 ([place])     Pneumonia due to COVID-19 virus with acute respiratory failure Patient has had symptoms for about 10 days following exposure to someone with COVID-19 viral infection He has been checking his  pulse oximetry at home and on the day of admission noted his pulse oximetry to be 86% Patient presented for evaluation of worsening shortness of breath and was noted to be tachypneic, he is currently on high flow nasal cannula to maintain pulse oximetry greater than 92% Chest x-ray shows bibasilar infiltrates Place patient on remdesivir per protocol Place patient on Solu-Medrol Supportive care bronchodilator therapy and antitussives   Acute respiratory failure Patient has had symptoms for about 10 days, his COVID-19 PCR test was positive on 01/30/2020 He presented for evaluation of hypoxia with room air pulse oximetry of 86% and is currently on high flow nasal cannula to maintain pulse oximetry greater than 92% Patient still also hypotensive Will obtain CT angiogram to rule out acute pulmonary embolism    Ischemic cardiomyopathy with a history of chronic systolic heart failure Patient's last known LVEF is 30 to 35% He was noted to be hypotensive in the ER with systolic blood pressure in the 80s and received 1 L of IV fluid Hold Lasix due to hypotension Continue aspirin, Brilinta and atorvastatin       DVT prophylaxis: Lovenox Code Status: Full code Family Communication: Greater than 50% of time was spent discussing plan of care with patient at the bedside.  He verbalizes understanding and agrees with the plan of care.  He wants his wife to make medical decisions for him if he is unable to. Disposition Plan: Back to previous home environment Consults  called: None    Eleen Litz MD [facility]     02/06/2020, 4:31 PM

## 2020-02-09 NOTE — Telephone Encounter (Signed)
I agree with urgent care/ED evaluation and establishment with a PCP and/or pulmonary provider.  Management of post-COVID pulmonary issues is beyond my scope of practice.  Nelva Bush, MD [facility]

## 2020-02-09 NOTE — ED Notes (Signed)
Pt to ct scan and then the floor

## 2020-02-09 NOTE — Telephone Encounter (Signed)
Patient is recovering from covid however he is having oxygen concerns. Patients O2 stats are going from 92 to low 70's. Patient had EMT come out saturday morning, checked his lungs and heart, all sounded normal. O2 stats were low 92s. This morning it was 20 Wife is very concerned to have him go to the hospital now that he is finally recovering.   Please advise

## 2020-02-09 NOTE — ED Notes (Signed)
Report called to Colin Broach

## 2020-02-09 NOTE — ED Provider Notes (Signed)
[facility] Emergency Department Provider Note   ____________________________________________    I have reviewed the triage vital signs and the nursing notes.   HISTORY  Chief Complaint Shortness of Breath     HPI Nicholas Schultz is a 74 y.o. male with history of CAD presents with shortness of breath.  Recently exposed to family member with COVID-55.  He has not been vaccinated.  Denies significant cough but does report shortness of breath, fatigue some body aches.  Chills unclear on fevers.  Does have a history of cigarette smoking in the past but not currently.  No recent travel.  No calf pain or swelling.  No chest pain  Past Medical History:  Diagnosis Date  . CAD (coronary artery disease)    a. s/p prior MI and stenting x 4 in [place], [place]; b. 06/2018 Ant STEMI/PCI: LM mod dzs, LAD 50ost, 99p (2.75x16 Synergy DES), 30p/m ISR, LCX nl, OM1 90, OM2 mod dzs, RCA large, 63m ISR, RPDA 70ost, RPAV 50.  [name] HFrEF (heart failure with reduced ejection fraction) ([place])    a. 06/2018 Echo: EF 30-35%.  . Ischemic cardiomyopathy    a. 06/2018 Echo: EF 30-35%, apical septal, mid and apical lateral, apical, apical inf, mid and apical ant, and mid antsept AK w/ inflat HK.   . Tobacco abuse    a. 1 cigar/day. Prev smoked 3ppd cigarettes.    Patient Active Problem List   Diagnosis Date Noted  . Syncope and collapse 04/10/2019  . ST elevation myocardial infarction (STEMI) of anterior wall, subsequent episode of care ([place]) 03/18/2019  . Coronary artery disease involving native coronary artery of native heart without angina pectoris 09/11/2018  . Chronic systolic heart failure ([place]) 09/11/2018  . Tobacco abuse 09/11/2018  . CAD S/P percutaneous coronary angioplasty prox LAD 07/19/2018  . Ischemic cardiomyopathy 07/19/2018  . Hyperlipidemia LDL goal <70 07/19/2018  . STEMI (ST elevation myocardial infarction) ([place]) 07/17/2018    Past Surgical History:  Procedure  Laterality Date  . CORONARY ANGIOPLASTY WITH STENT PLACEMENT    . CORONARY/GRAFT ACUTE MI REVASCULARIZATION N/A 07/16/2018   Procedure: Coronary/Graft Acute MI Revascularization;  Surgeon: Nelva Bush, MD;  Location: [place] [facility];  Service: Cardiovascular;  Laterality: N/A;  . CORONARY/GRAFT ACUTE MI REVASCULARIZATION N/A 03/18/2019   Procedure: Coronary/Graft Acute MI Revascularization;  Surgeon: Nelva Bush, MD;  Location: [place] [facility];  Service: Cardiovascular;  Laterality: N/A;  . IABP INSERTION N/A 07/16/2018   Procedure: IABP Insertion;  Surgeon: Nelva Bush, MD;  Location: [place] [facility];  Service: Cardiovascular;  Laterality: N/A;  . LEFT HEART CATH AND CORONARY ANGIOGRAPHY N/A 07/16/2018   Procedure: LEFT HEART CATH AND CORONARY ANGIOGRAPHY;  Surgeon: Nelva Bush, MD;  Location: [place] [facility];  Service: Cardiovascular;  Laterality: N/A;  . LEFT HEART CATH AND CORONARY ANGIOGRAPHY N/A 03/18/2019   Procedure: LEFT HEART CATH AND CORONARY ANGIOGRAPHY;  Surgeon: Nelva Bush, MD;  Location: [place] [facility];  Service: Cardiovascular;  Laterality: N/A;    Prior to Admission medications   Medication Sig Start Date End Date Taking? Authorizing Provider  aspirin EC 81 MG tablet Take 1 tablet (81 mg total) by mouth daily. 07/19/18   Lyda Jester M, PA-C  atorvastatin (LIPITOR) 80 MG tablet TAKE 1 TABLET BY MOUTH EVERY DAY AT 6 PM 05/26/19   End, Harrell Gave, MD  flavoxATE (URISPAS) 100 MG tablet Take 100 mg by mouth as needed (urinary frequency).    [provider]  furosemide (LASIX) 40 MG tablet Take 1 tablet (40 mg) by mouth once a day for 2-3 days until weight down 5 lbs. Then take 1 tablet (40 mg) by mouth daily as needed for swelling. 05/22/19   End, Harrell Gave, MD  nitroGLYCERIN (NITROSTAT) 0.4 MG SL tablet Place 1 tablet (0.4 mg total) under the tongue every 5 (five) minutes x 3 doses as needed for chest pain. 07/19/18    Consuelo Pandy, PA-C  ticagrelor (BRILINTA) 90 MG TABS tablet Take 1 tablet (90 mg total) by mouth 2 (two) times daily. 07/14/19   End, Harrell Gave, MD     Allergies Patient has no known allergies.  Family History  Problem Relation Age of Onset  . Cancer Mother   . Heart disease Father     Social History Social History   Tobacco Use  . Smoking status: Current Every Day Smoker    Types: Cigars  . Smokeless tobacco: Never Used  . Tobacco comment: 1 per day  Vaping Use  . Vaping Use: Never used  Substance Use Topics  . Alcohol use: Yes    Comment: rare  . Drug use: Never    Review of Systems  Constitutional: As above Eyes: No visual changes.  ENT: No sore throat. Cardiovascular: As above Respiratory: As above Gastrointestinal: No abdominal pain.  No nausea, no vomiting.   Genitourinary: Negative for dysuria. Musculoskeletal: Negative for back pain. Skin: Negative for rash. Neurological: Negative for headaches    ____________________________________________   PHYSICAL EXAM:  VITAL SIGNS: ED Triage Vitals  Enc Vitals Group     BP 01/29/2020 1340 (!) 98/52     Pulse Rate 01/30/2020 1340 (!) 116     Resp 01/22/2020 1340 (!) 30     Temp 02/05/2020 1340 98.1 F (36.7 C)     Temp Source 02/02/2020 1340 Axillary     SpO2 02/10/2020 1340 (!) 75 %     Weight 02/14/2020 1348 77.1 kg (170 lb)     Height 01/25/2020 1348 1.803 m (5\' 11" )     Head Circumference --      Peak Flow --      Pain Score 02/06/2020 1348 0     Pain Loc --      Pain Edu? --      Excl. in [place]? --     Constitutional: Alert and oriented.   Nose: No congestion/rhinnorhea. Mouth/Throat: Mucous membranes are moist.    Cardiovascular: Normal rate, regular rhythm. Grossly normal heart sounds.  Good peripheral circulation. Respiratory: Increased respiratory effort with tachypnea, scattered rails Gastrointestinal: Soft and nontender. No distention.   Musculoskeletal: No lower extremity tenderness nor edema.   Warm and well perfused Neurologic:  Normal speech and language. No gross focal neurologic deficits are appreciated.  Skin:  Skin is warm, dry and intact. No rash noted. Psychiatric: Mood and affect are normal. Speech and behavior are normal.  ____________________________________________   LABS (all labs ordered are listed, but only abnormal results are displayed)  Labs Reviewed  CBC WITH DIFFERENTIAL/PLATELET - Abnormal; Notable for the following components:      Result Value   WBC 12.1 (*)    RDW 16.8 (*)    All other components within normal limits  SARS CORONAVIRUS 2 BY RT PCR (HOSPITAL ORDER, [place] [facility])  CULTURE, BLOOD (ROUTINE X 2)  CULTURE, BLOOD (ROUTINE X 2)  LACTIC ACID, PLASMA  LACTIC ACID, PLASMA  COMPREHENSIVE METABOLIC PANEL  FIBRIN DERIVATIVES D-DIMER (  [facility] ONLY)  PROCALCITONIN  LACTATE DEHYDROGENASE  FERRITIN  TRIGLYCERIDES  FIBRINOGEN  C-REACTIVE PROTEIN  TROPONIN I (HIGH SENSITIVITY)   ____________________________________________  EKG  ED ECG REPORT I, Lavonia Drafts, the attending physician, personally viewed and interpreted this ECG.  Date: 02/15/2020  Rhythm: Tachycardia QRS Axis: normal Intervals: normal ST/T Wave abnormalities: Nonspecific changes Narrative Interpretation: no evidence of acute ischemia  ____________________________________________  RADIOLOGY  Chest x-ray reviewed by me, edema versus pneumonia on my read ____________________________________________   PROCEDURES  Procedure(s) performed: No  Procedures   Critical Care performed: yes  CRITICAL CARE Performed by: Lavonia Drafts   Total critical care time: 35 minutes  Critical care time was exclusive of separately billable procedures and treating other patients.  Critical care was necessary to treat or prevent imminent or life-threatening deterioration.  Critical care was time spent personally by me on the following activities:  development of treatment plan with patient and/or surrogate as well as nursing, discussions with consultants, evaluation of patient's response to treatment, examination of patient, obtaining history from patient or surrogate, ordering and performing treatments and interventions, ordering and review of laboratory studies, ordering and review of radiographic studies, pulse oximetry and re-evaluation of patient's condition.  ____________________________________________   INITIAL IMPRESSION / ASSESSMENT AND PLAN / ED COURSE  Pertinent labs & imaging results that were available during my care of the patient were reviewed by me and considered in my medical decision making (see chart for details).  Patient presents with shortness of breath, borderline blood pressure and tachycardia.  Given recent exposure to COVID-19 and unvaccinated status, highly suspicious for COVID-19 pneumonia with respiratory failure.  Oxygen saturation 75% on 6 L nasal cannula, currently on nonrebreather.  I have contacted respiratory therapist to fit high flow nasal cannula.  Differential also includes bacterial pneumonia, COPD exacerbation however without significant wheezing at this time.  Pending Covid swab, labs, chest x-ray.  Have asked my colleague to follow-up on lab results patient will require admission    ____________________________________________   FINAL CLINICAL IMPRESSION(S) / ED DIAGNOSES  Final diagnoses:  Acute respiratory failure with hypoxia [facility])        Note:  This document was prepared using Dragon voice recognition software and may include unintentional dictation errors.   Lavonia Drafts, MD 02/19/2020 [phone]

## 2020-02-10 LAB — COMPREHENSIVE METABOLIC PANEL
ALT: 35 U/L (ref 0–44)
AST: 67 U/L — ABNORMAL HIGH (ref 15–41)
Albumin: 2.9 g/dL — ABNORMAL LOW (ref 3.5–5.0)
Alkaline Phosphatase: 120 U/L (ref 38–126)
Anion gap: 12 (ref 5–15)
BUN: 35 mg/dL — ABNORMAL HIGH (ref 8–23)
CO2: 24 mmol/L (ref 22–32)
Calcium: 8.9 mg/dL (ref 8.9–10.3)
Chloride: 107 mmol/L (ref 98–111)
Creatinine, Ser: 1.14 mg/dL (ref 0.61–1.24)
GFR calc Af Amer: >60 mL/min (ref >60)
GFR calc non Af Amer: >60 mL/min (ref >60)
Glucose, Bld: 127 mg/dL — ABNORMAL HIGH (ref 70–99)
Potassium: 5.4 mmol/L — ABNORMAL HIGH (ref 3.5–5.1)
Sodium: 143 mmol/L (ref 135–145)
Total Bilirubin: 2 mg/dL — ABNORMAL HIGH (ref 0.3–1.2)
Total Protein: 6.6 g/dL (ref 6.5–8.1)

## 2020-02-10 LAB — CBC WITH DIFFERENTIAL/PLATELET
Abs Immature Granulocytes: 0.1 10*3/uL — ABNORMAL HIGH (ref 0.00–0.07)
Basophils Absolute: 0.1 10*3/uL (ref 0.0–0.1)
Basophils Relative: 1 %
Eosinophils Absolute: 0 10*3/uL (ref 0.0–0.5)
Eosinophils Relative: 0 %
HCT: 44 % (ref 39.0–52.0)
Hemoglobin: 14.9 g/dL (ref 13.0–17.0)
Immature Granulocytes: 1 %
Lymphocytes Relative: 8 %
Lymphs Abs: 0.8 10*3/uL (ref 0.7–4.0)
MCH: 28.3 pg (ref 26.0–34.0)
MCHC: 33.9 g/dL (ref 30.0–36.0)
MCV: 83.5 fL (ref 80.0–100.0)
Monocytes Absolute: 0.9 10*3/uL (ref 0.1–1.0)
Monocytes Relative: 9 %
Neutro Abs: 8.8 10*3/uL — ABNORMAL HIGH (ref 1.7–7.7)
Neutrophils Relative %: 81 %
Platelets: 155 10*3/uL (ref 150–400)
RBC: 5.27 MIL/uL (ref 4.22–5.81)
RDW: 16.9 % — ABNORMAL HIGH (ref 11.5–15.5)
Smear Review: NORMAL
WBC: 10.7 10*3/uL — ABNORMAL HIGH (ref 4.0–10.5)
nRBC: 0 % (ref 0.0–0.2)

## 2020-02-10 LAB — FIBRIN DERIVATIVES D-DIMER (ARMC ONLY): Fibrin derivatives D-dimer (ARMC): >7500 ng/mL (FEU) — ABNORMAL HIGH (ref 0.00–499.00)

## 2020-02-10 LAB — MAGNESIUM: Magnesium: 2.8 mg/dL — ABNORMAL HIGH (ref 1.7–2.4)

## 2020-02-10 LAB — LACTIC ACID, PLASMA
Lactic Acid, Venous: 3.5 mmol/L (ref 0.5–1.9)
Lactic Acid, Venous: 2.4 mmol/L (ref 0.5–1.9)
Lactic Acid, Venous: 3.2 mmol/L (ref 0.5–1.9)

## 2020-02-10 LAB — C-REACTIVE PROTEIN: CRP: 9.1 mg/dL — ABNORMAL HIGH (ref <1.0)

## 2020-02-10 LAB — FERRITIN: Ferritin: 2728 ng/mL — ABNORMAL HIGH (ref 24–336)

## 2020-02-10 LAB — PHOSPHORUS: Phosphorus: 4.6 mg/dL (ref 2.5–4.6)

## 2020-02-10 MED ORDER — FUROSEMIDE 10 MG/ML IJ SOLN: 20.0000 mg | Freq: Once | INTRAMUSCULAR | Status: DC

## 2020-02-10 MED ORDER — FUROSEMIDE 10 MG/ML IJ SOLN
20.0000 mg | Freq: Once | INTRAMUSCULAR | Status: AC
  Administered 2020-02-10: 10:00:00 20 mg via INTRAVENOUS
  Filled 2020-02-10: qty 2

## 2020-02-10 MED ORDER — BARICITINIB 2 MG PO TABS
4.0000 mg | ORAL_TABLET | Freq: Every day | ORAL | Status: DC
  Administered 2020-02-10 – 2020-02-13 (×4): 4 mg via ORAL
  Filled 2020-02-10 (×3): qty 2

## 2020-02-10 NOTE — Plan of Care (Signed)

## 2020-02-10 NOTE — Progress Notes (Signed)
PROGRESS NOTE    Nicholas Schultz  UUV:[id number] DOB: 22-May-1946 DOA: 02/15/2020 PCP: Patient, [name] Per   Brief Narrative:  HPI: Nicholas Schultz is a 74 y.o. male with medical history significant for coronary artery disease, heart failure with reduced EF (last known LVEF 30 to 35%), history of ischemic cardiomyopathy, nicotine dependence who presents to the emergency room for evaluation of worsening shortness of breath.  Patient was recently exposed to a family member who had COVID-19 infection and had a positive Covid PCR test about 10 days ago.   He is unvaccinated.   His wife has been checking his pulse oximetry at home and today noted that his pulse oximetry had been about 86% which prompted patient's visit to the emergency room for further evaluation. He complains of a fever and chills as well as myalgias and weakness but denies having any cough. He has nausea but no vomiting.  He denies having any chest pain, no abdominal pain, no dizziness, no lightheadedness, no urinary symptoms or changes in his bowel habits. Labs show sodium 139, potassium 3.9, chloride 103, bicarb 21, BUN 101, BUN 35, creatinine 1.35, calcium 8.4, alkaline phosphatase 129, ALT 35, AST 80, total protein 6.6, LDH 1100, troponin 38, ferritin 3623, lactic acid 4.0, procalcitonin 0.18, white count 12.1, hemoglobin 15.1, hematocrit 44.5, MCV 84.1, RDW 16.8, fibrin derivatives 7,5000 Chest x-ray reviewed by me shows bilateral interstitial opacities. Lead EKG shows sinus tachycardia, PVCs and left bundle branch block    ED Course: Patient is a 74 year old male with a history of ischemic cardiomyopathy, chronic systolic heart failure with last known LVEF of 30 to 35% who presents to the emergency room via EMS for evaluation of hypoxia.  Patient had a positive Covid test about 10 days ago and has been checking his pulse oximetry at home.  Patient noted to have room air pulse oximetry of 86% prompting a visit to the emergency room.   Patient has been hypotensive with systolic blood pressure in the 80s but is awake and alert.  He received 1 L IV fluid hydration in the ER with improvement in his blood pressure.  He also received remdesivir and prednisone.  He is currently on high flow oxygen and will be admitted to the hospital for further evaluation.  Assessment & Plan:   Principal Problem:   Pneumonia due to COVID-19 virus Active Problems:   Ischemic cardiomyopathy   Chronic systolic heart failure (HCC)   Tobacco abuse   Acute respiratory failure due to COVID-19 [facility])  Acute hypoxic respiratory failure and severe sepsis secondary to COVID-19 pneumonia: Patient meets severe sepsis criteria based on tachycardia, tachypnea, leukocytosis, hypoxia and lactic acid of 4.0.  Patient was requiring up to 13 L of high flow oxygen up until last night but this morning, he states that he was feeling better and currently he is on 6 L of high flow.  Significantly elevated D-dimer.  CT angiogram negative for PE.  Slight improvement in inflammatory markers otherwise.  Continue to wean oxygen.  Repeat lactic acid.  Continue following Remdesivir per pharmacy protocol IV Solu-Medrol Bronchodilator, antitussive.  Patient was encouraged to prone, out of bed to chair, to use incentive spirometry and flutter valve. Actemra/Baricitinib  off label use -patient himself too confused to comprehend benefits and risks of this medication and thus he is not in a position to provide any consent.  Patient's wife was told that if COVID-19 pneumonitis gets worse we might potentially use Actemra/ barititinib off label, she told  me that patient has no known history of active diverticulitis, tuberculosis or hepatitis, understands the risks and benefits and wants to proceed with Actemra/barititinib and thus I will start him on baricitinib. The treatment plan and use of medications and known side effects were discussed with patient/family, they were clearly explained  that there is no proven definitive treatment for COVID-19 infection, any medications used here are based on published clinical articles/anecdotal data which are not peer-reviewed or randomized control trials.  Complete risks and long-term side effects are unknown, however in the best clinical judgment they seem to be of some clinical benefit rather than medical risks.  Patient/family agree with the treatment plan and want to receive the given medications.  History of ischemic cardiomyopathy with chronic systolic congestive heart failure: Patient's LVEF is 30 to 35% per echo done previously.  He was hypotensive in the ED and received 1 L of IV fluid.  Lasix were held because of that.  Now his blood pressure is better.  We will give him 1 dose of Lasix 20 mg IV in effort to keep him dry and prevent fluid overload which is proved to be detrimental in COVID-19 patients.  Will reassess daily for further Lasix doses.  Continue aspirin and Brilinta.  Hyperkalemia: 5.4.  Hopefully this will improve with Lasix.  Recheck in the morning.  Possible early dementia: Patient was slightly confused and somewhat lethargic this morning.  I do not think this is acute encephalopathy.  Discussed with wife.  She tells me that patient has been having early signs of dementia.  Will need outpatient evaluation.  DVT prophylaxis:    Code Status: Full Code  Family Communication: None present at bedside.  Plan of care discussed with patient's wife over the phone.  She verbalized understanding.  Status is: Inpatient  Remains inpatient appropriate because:Hemodynamically unstable   Dispo: The patient is from: Home              Anticipated d/c is to: Home              Anticipated d/c date is: > 3 days              Patient currently is not medically stable to d/c.        Estimated body mass index is 23.71 kg/m as calculated from the following:   Height as of this encounter: 5\' 11"  (1.803 m).   Weight as of this  encounter: 77.1 kg.      Nutritional status:               Consultants:   None  Procedures:   None  Antimicrobials:  Anti-infectives (From admission, onward)   Start     Dose/Rate Route Frequency Ordered Stop   02/10/20 1000  remdesivir 100 mg in sodium chloride 0.9 % 100 mL IVPB       "Followed by" Linked Group Details   100 mg 200 mL/hr over 30 Minutes Intravenous Daily 01/23/2020 1618 02/14/20 0959   01/22/2020 1700  remdesivir 200 mg in sodium chloride 0.9% 250 mL IVPB       "Followed by" Linked Group Details   200 mg 580 mL/hr over 30 Minutes Intravenous Once 02/02/2020 1618 01/28/2020 1815         Subjective: Patient seen and examined this morning.  When asked him how he was feeling, he responded " I always feel well".  He looked confused so I asked him further questions about orientation.  He knew  he was in the hospital but did not know the name of the hospital.  He told me it was October instead of September.  When I asked every about the year, he responded "I do not give a f..k".   Objective: Vitals:   02/10/20 0453 02/10/20 0800 02/10/20 1002 02/10/20 1130  BP: 102/77 (!) 86/57  110/68  Pulse: 61 78  84  Resp: 17 19  18   Temp: 98.3 F (36.8 C) 98.2 F (36.8 C)  98.1 F (36.7 C)  TempSrc:  Oral  Oral  SpO2: 95% 97% 96% 94%  Weight:      Height:        Intake/Output Summary (Last 24 hours) at 02/10/2020 1335 Last data filed at 02/07/2020 2026 Gross per 24 hour  Intake 410.28 ml  Output --  Net 410.28 ml   Filed Weights   01/29/2020 1348  Weight: 77.1 kg    Examination:  General exam: Appears slightly lethargic and confused Respiratory system: Bilateral rhonchi. Respiratory effort normal. Cardiovascular system: S1 & S2 heard, RRR. No JVD, murmurs, rubs, gallops or clicks. No pedal edema. Gastrointestinal system: Abdomen is nondistended, soft and nontender. No organomegaly or masses felt. Normal bowel sounds heard. Central nervous system: Alert  and oriented. No focal neurological deficits. Extremities: Symmetric 5 x 5 power. Skin: No rashes, lesions or ulcers Psychiatry: Judgement and insight appear poor, mood & affect appropriate.    Data Reviewed: I have personally reviewed following labs and imaging studies  CBC: Recent Labs  Lab 01/31/2020 1347 02/10/20 0520  WBC 12.1* 10.7*  NEUTROABS 10.1* 8.8*  HGB 15.1 14.9  HCT 44.5 44.0  MCV 84.1 83.5  PLT 172 409   Basic Metabolic Panel: Recent Labs  Lab 01/22/2020 1347 02/10/20 0520  NA 139 143  K 3.9 5.4*  CL 103 107  CO2 21* 24  GLUCOSE 101* 127*  BUN 35* 35*  CREATININE 1.35* 1.14  CALCIUM 8.4* 8.9  MG  --  2.8*  PHOS  --  4.6   GFR: Estimated Creatinine Clearance: 59.6 mL/min (by C-G formula based on SCr of 1.14 mg/dL). Liver Function Tests: Recent Labs  Lab 02/12/2020 1347 02/10/20 0520  AST 80* 67*  ALT 35 35  ALKPHOS 129* 120  BILITOT 2.2* 2.0*  PROT 6.6 6.6  ALBUMIN 2.9* 2.9*   No results for input(s): LIPASE, AMYLASE in the last 168 hours. No results for input(s): AMMONIA in the last 168 hours. Coagulation Profile: No results for input(s): INR, PROTIME in the last 168 hours. Cardiac Enzymes: No results for input(s): CKTOTAL, CKMB, CKMBINDEX, TROPONINI in the last 168 hours. BNP (last 3 results) No results for input(s): PROBNP in the last 8760 hours. HbA1C: No results for input(s): HGBA1C in the last 72 hours. CBG: No results for input(s): GLUCAP in the last 168 hours. Lipid Profile: Recent Labs    02/15/2020 1347  TRIG 177*   Thyroid Function Tests: No results for input(s): TSH, T4TOTAL, FREET4, T3FREE, THYROIDAB in the last 72 hours. Anemia Panel: Recent Labs    02/08/2020 1347 02/10/20 0520  FERRITIN 3,623* 2,728*   Sepsis Labs: Recent Labs  Lab 02/15/2020 1347 02/03/2020 1405 02/10/20 0520  PROCALCITON 0.18  --   --   LATICACIDVEN  --  4.0* 3.5*    Recent Results (from the past 240 hour(s))  SARS Coronavirus 2 by RT PCR  (hospital order, performed in [facility] hospital lab) Nasopharyngeal Nasopharyngeal Swab     Status: Abnormal   Collection  Time: 02/07/2020  2:05 PM   Specimen: Nasopharyngeal Swab  Result Value Ref Range Status   SARS Coronavirus 2 POSITIVE (A) NEGATIVE Final    Comment: RESULT CALLED TO, READ BACK BY AND VERIFIED WITH: AMY COWEN 01/26/2020 AT 1534 BY ACR (NOTE) SARS-CoV-2 target nucleic acids are DETECTED  SARS-CoV-2 RNA is generally detectable in upper respiratory specimens  during the acute phase of infection.  Positive results are indicative  of the presence of the identified virus, but do not rule out bacterial infection or co-infection with other pathogens not detected by the test.  Clinical correlation with patient history and  other diagnostic information is necessary to determine patient infection status.  The expected result is negative.  Fact Sheet for Patients:   StrictlyIdeas.no   Fact Sheet for Healthcare Providers:   BankingDealers.co.za    This test is not yet approved or cleared by the [place] FDA and  has been authorized for detection and/or diagnosis of SARS-CoV-2 by FDA under an Emergency Use Authorization (EUA).  This EUA will remain in effect (meaning this test  can be used) for the duration of  the COVID-19 declaration under Section 564(b)(1) of the Act, 21 U.S.C. section 360-bbb-3(b)(1), unless the authorization is terminated or revoked sooner.  Performed at [facility], [address] [place], [place] [address]   Blood Culture (routine x 2)     Status: None (Preliminary result)   Collection Time: 02/03/2020  2:20 PM   Specimen: BLOOD  Result Value Ref Range Status   Specimen Description BLOOD BLOOD LEFT ARM  Final   Special Requests   Final    BOTTLES DRAWN AEROBIC AND ANAEROBIC Blood Culture adequate volume   Culture   Final    NO GROWTH < 24 HOURS Performed at [facility], [address], [place], [place] [address]    Report Status PENDING  Incomplete  Blood Culture (routine x 2)     Status: None (Preliminary result)   Collection Time: 02/16/2020  2:28 PM   Specimen: BLOOD  Result Value Ref Range Status   Specimen Description BLOOD RIGHT ANTECUBITAL  Final   Special Requests   Final    BOTTLES DRAWN AEROBIC AND ANAEROBIC Blood Culture adequate volume   Culture   Final    NO GROWTH < 24 HOURS Performed at [facility], [address] [place], [place] [address]    Report Status PENDING  Incomplete      Radiology Studies: CT ANGIO CHEST PE W OR WO CONTRAST  Result Date: 02/16/2020 CLINICAL DATA:  COVID-19 positive, hypoxia, short of breath EXAM: CT ANGIOGRAPHY CHEST WITH CONTRAST TECHNIQUE: Multidetector CT imaging of the chest was performed using the standard protocol during bolus administration of intravenous contrast. Multiplanar CT image reconstructions and MIPs were obtained to evaluate the vascular anatomy. CONTRAST:  68mL OMNIPAQUE IOHEXOL 350 MG/ML SOLN COMPARISON:  02/03/2020 FINDINGS: Cardiovascular: This is a technically adequate evaluation of the pulmonary vasculature. There are no filling defects or pulmonary emboli. There is marked left ventricular dilatation. No pericardial effusion. Normal caliber of the thoracic aorta. Moderate atherosclerosis of the aorta, with extensive atherosclerosis of the coronary vasculature. Mediastinum/Nodes: Mediastinal and hilar adenopathy likely reactive. Largest lymph node in the right paratracheal region measures 12 mm in short axis. Thyroid, trachea, and esophagus are unremarkable. Lungs/Pleura: There is bilateral multifocal ground-glass airspace disease superimposed upon background emphysema and mild bronchiectasis. No effusion or pneumothorax. Central airways are patent. Upper Abdomen: No acute abnormality. Musculoskeletal:  No acute or destructive bony lesions. Reconstructed images demonstrate no additional  findings. Review of the MIP images confirms the above findings. IMPRESSION: 1. No evidence of pulmonary embolus. 2. Bilateral multifocal ground-glass airspace disease consistent with COVID-19 pneumonia. 3. Marked left ventricular dilatation. 4. Mediastinal and hilar adenopathy, likely reactive. 5. Aortic Atherosclerosis (ICD10-I70.0) and Emphysema (ICD10-J43.9). Electronically Signed   By: Randa Ngo M.D.   On: 02/02/2020 17:59   DG Chest Port 1 View  Result Date: 01/21/2020 CLINICAL DATA:  Shortness of breath with hypoxemia.  No fever. EXAM: PORTABLE CHEST 1 VIEW COMPARISON:  Radiographs 03/20/2019 and 07/17/2018. FINDINGS: 1355 hours. Two views were obtained. The heart size and mediastinal contours are stable. Diffuse interstitial opacities are again noted in both lungs with an asymmetric component in the right upper lobe. Compared with the prior study, the current findings are less severe. There is no confluent airspace opacity, significant pleural effusion or pneumothorax. The bones appear intact. IMPRESSION: Overall improved aeration of both lungs with persistent/recurrent diffuse interstitial opacities, most pronounced in the right upper lobe. Findings are most consistent with atypical edema or atypical infection. There could be a component of underlying fibrosis, and follow-up until clearing recommended. Electronically Signed   By: Richardean Sale M.D.   On: 02/11/2020 14:15    Scheduled Meds: . albuterol  2 puff Inhalation Q6H  . vitamin C  500 mg Oral Daily  . aspirin EC  81 mg Oral Daily  . atorvastatin  80 mg Oral Daily  . baricitinib  4 mg Oral Daily  . methylPREDNISolone (SOLU-MEDROL) injection  0.5 mg/kg Intravenous Q12H   Followed by  . [START ON 02/13/2020] predniSONE  50 mg Oral Daily  . sodium chloride flush  3 mL Intravenous Q12H  . ticagrelor  90 mg Oral BID  . zinc sulfate  220 mg Oral Daily   Continuous Infusions: . sodium chloride    . remdesivir 100 mg in NS 100 mL  100 mg (02/10/20 0950)     LOS: 1 day   Time spent: 37 minutes   Darliss Cheney, MD [facility]  02/10/2020, 1:35 PM   To contact the attending provider between 7A-7P or the covering provider during after hours 7P-7A, please log into the web site [email or web address].

## 2020-02-10 NOTE — Progress Notes (Signed)
Pt wife updated

## 2020-02-11 ENCOUNTER — Inpatient Hospital Stay: Payer: Medicare HMO

## 2020-02-11 DIAGNOSIS — Z7189 Other specified counseling: Secondary | ICD-10-CM

## 2020-02-11 DIAGNOSIS — Z515 Encounter for palliative care: Secondary | ICD-10-CM

## 2020-02-11 DIAGNOSIS — E872 Acidosis, unspecified: Secondary | ICD-10-CM | POA: Clinically undetermined

## 2020-02-11 LAB — COMPREHENSIVE METABOLIC PANEL
ALT: 37 U/L (ref 0–44)
AST: 54 U/L — ABNORMAL HIGH (ref 15–41)
Albumin: 2.7 g/dL — ABNORMAL LOW (ref 3.5–5.0)
Alkaline Phosphatase: 116 U/L (ref 38–126)
Anion gap: 15 (ref 5–15)
BUN: 37 mg/dL — ABNORMAL HIGH (ref 8–23)
CO2: 19 mmol/L — ABNORMAL LOW (ref 22–32)
Calcium: 8.6 mg/dL — ABNORMAL LOW (ref 8.9–10.3)
Chloride: 105 mmol/L (ref 98–111)
Creatinine, Ser: 0.98 mg/dL (ref 0.61–1.24)
GFR calc Af Amer: >60 mL/min (ref >60)
GFR calc non Af Amer: >60 mL/min (ref >60)
Glucose, Bld: 107 mg/dL — ABNORMAL HIGH (ref 70–99)
Potassium: 3.4 mmol/L — ABNORMAL LOW (ref 3.5–5.1)
Sodium: 139 mmol/L (ref 135–145)
Total Bilirubin: 2.4 mg/dL — ABNORMAL HIGH (ref 0.3–1.2)
Total Protein: 6.4 g/dL — ABNORMAL LOW (ref 6.5–8.1)

## 2020-02-11 LAB — C-REACTIVE PROTEIN: CRP: 6.4 mg/dL — ABNORMAL HIGH (ref <1.0)

## 2020-02-11 LAB — CBC WITH DIFFERENTIAL/PLATELET
Abs Immature Granulocytes: 0.03 10*3/uL (ref 0.00–0.07)
Basophils Absolute: 0 10*3/uL (ref 0.0–0.1)
Basophils Relative: 1 %
Eosinophils Absolute: 0 10*3/uL (ref 0.0–0.5)
Eosinophils Relative: 0 %
HCT: 47.8 % (ref 39.0–52.0)
Hemoglobin: 15.7 g/dL (ref 13.0–17.0)
Immature Granulocytes: 0 %
Lymphocytes Relative: 9 %
Lymphs Abs: 0.8 10*3/uL (ref 0.7–4.0)
MCH: 28.2 pg (ref 26.0–34.0)
MCHC: 32.8 g/dL (ref 30.0–36.0)
MCV: 86 fL (ref 80.0–100.0)
Monocytes Absolute: 0.5 10*3/uL (ref 0.1–1.0)
Monocytes Relative: 6 %
Neutro Abs: 6.9 10*3/uL (ref 1.7–7.7)
Neutrophils Relative %: 84 %
Platelets: 135 10*3/uL — ABNORMAL LOW (ref 150–400)
RBC: 5.56 MIL/uL (ref 4.22–5.81)
RDW: 16.8 % — ABNORMAL HIGH (ref 11.5–15.5)
Smear Review: NORMAL
WBC: 8.2 10*3/uL (ref 4.0–10.5)
nRBC: 0 % (ref 0.0–0.2)

## 2020-02-11 LAB — LACTIC ACID, PLASMA
Lactic Acid, Venous: 4.2 mmol/L (ref 0.5–1.9)
Lactic Acid, Venous: 4.6 mmol/L (ref 0.5–1.9)
Lactic Acid, Venous: 4.4 mmol/L (ref 0.5–1.9)

## 2020-02-11 LAB — BLOOD GAS, ARTERIAL
Acid-base deficit: 3.7 mmol/L — ABNORMAL HIGH (ref 0.0–2.0)
Bicarbonate: 18.2 mmol/L — ABNORMAL LOW (ref 20.0–28.0)
O2 Saturation: 94.3 %
Patient temperature: 37
pCO2 arterial: 25 mmHg — ABNORMAL LOW (ref 32.0–48.0)
pH, Arterial: 7.47 — ABNORMAL HIGH (ref 7.350–7.450)
pO2, Arterial: 67 mmHg — ABNORMAL LOW (ref 83.0–108.0)

## 2020-02-11 LAB — MAGNESIUM: Magnesium: 2.4 mg/dL (ref 1.7–2.4)

## 2020-02-11 LAB — PHOSPHORUS: Phosphorus: 3.4 mg/dL (ref 2.5–4.6)

## 2020-02-11 LAB — FERRITIN: Ferritin: 1461 ng/mL — ABNORMAL HIGH (ref 24–336)

## 2020-02-11 LAB — BRAIN NATRIURETIC PEPTIDE: B Natriuretic Peptide: 1530.2 pg/mL — ABNORMAL HIGH (ref 0.0–100.0)

## 2020-02-11 LAB — FIBRIN DERIVATIVES D-DIMER (ARMC ONLY): Fibrin derivatives D-dimer (ARMC): >7500 ng/mL (FEU) — ABNORMAL HIGH (ref 0.00–499.00)

## 2020-02-11 MED ORDER — VANCOMYCIN HCL IN DEXTROSE 1-5 GM/200ML-% IV SOLN
1000.0000 mg | Freq: Two times a day (BID) | INTRAVENOUS | Status: DC
  Administered 2020-02-12 – 2020-02-13 (×3): 1000 mg via INTRAVENOUS
  Filled 2020-02-11 (×5): qty 200

## 2020-02-11 MED ORDER — PIPERACILLIN-TAZOBACTAM 3.375 G IVPB
3.3750 g | Freq: Three times a day (TID) | INTRAVENOUS | Status: DC
  Administered 2020-02-11 – 2020-02-13 (×6): 3.375 g via INTRAVENOUS
  Filled 2020-02-11 (×8): qty 50

## 2020-02-11 MED ORDER — SODIUM CHLORIDE 0.9 % IV SOLN: INTRAVENOUS | Status: DC

## 2020-02-11 MED ORDER — ENOXAPARIN SODIUM 80 MG/0.8ML ~~LOC~~ SOLN
1.0000 mg/kg | Freq: Two times a day (BID) | SUBCUTANEOUS | Status: DC
  Administered 2020-02-11: 75 mg via SUBCUTANEOUS
  Filled 2020-02-11 (×2): qty 0.8

## 2020-02-11 MED ORDER — VANCOMYCIN HCL 1500 MG/300ML IV SOLN
1500.0000 mg | Freq: Once | INTRAVENOUS | Status: AC
  Administered 2020-02-11: 15:00:00 1500 mg via INTRAVENOUS
  Filled 2020-02-11: qty 300

## 2020-02-11 MED ORDER — PIPERACILLIN-TAZOBACTAM 3.375 G IVPB 30 MIN: 3.3750 g | Freq: Three times a day (TID) | INTRAVENOUS | Status: DC

## 2020-02-11 MED ORDER — SODIUM CHLORIDE 0.9 % IV SOLN: 250.0000 mL | INTRAVENOUS | Status: DC | PRN

## 2020-02-11 MED ORDER — CHLORHEXIDINE GLUCONATE CLOTH 2 % EX PADS
6.0000 | MEDICATED_PAD | Freq: Every day | CUTANEOUS | Status: DC
  Administered 2020-02-12 – 2020-02-13 (×2): 6 via TOPICAL

## 2020-02-11 MED ORDER — POTASSIUM CHLORIDE 10 MEQ/100ML IV SOLN
10.0000 meq | INTRAVENOUS | Status: AC
  Administered 2020-02-11 (×2): 10 meq via INTRAVENOUS
  Filled 2020-02-11 (×2): qty 100

## 2020-02-11 NOTE — Consult Note (Signed)
CARDIOLOGY CONSULT NOTE               Patient ID: Nicholas Schultz MRN: [id number] DOB/AGE: June 16, 1945 74 y.o.  Admit date: 02/14/2020 Referring Physician Dr. Francine Graven hospitalist Primary Physician unknown Primary Cardiologist [facility] Reason for Consultation shortness of breath known coronary artery disease  HPI: Patient presented with a shortness of breath.  2 5 COVID-19 weakness fatigue shortness of breath and poorly has an EF of around 30 to 35% with a history of heart failure ischemic cardiomyopathy nicotine dependence with worsening dyspnea family member developed COVID-19 infection and had a positive PCR about 10 days ago he was not vaccinated his wife is checking his pulse ox at home and it dropped to 86 with his dyspnea and fatigue so he is brought to emergency room chest x-ray showed bilateral infiltrates EKG had sinus tachycardia with left bundle branch block so patient was admitted for further evaluation and treatment of COVID-19  Review of systems complete and found to be negative unless listed above     Past Medical History:  Diagnosis Date  . CAD (coronary artery disease)    a. s/p prior MI and stenting x 4 in [place], [place]; b. 06/2018 Ant STEMI/PCI: LM mod dzs, LAD 50ost, 99p (2.75x16 Synergy DES), 30p/m ISR, LCX nl, OM1 90, OM2 mod dzs, RCA large, 65m ISR, RPDA 70ost, RPAV 50.  [name] HFrEF (heart failure with reduced ejection fraction) ([place])    a. 06/2018 Echo: EF 30-35%.  . Ischemic cardiomyopathy    a. 06/2018 Echo: EF 30-35%, apical septal, mid and apical lateral, apical, apical inf, mid and apical ant, and mid antsept AK w/ inflat HK.   . Tobacco abuse    a. 1 cigar/day. Prev smoked 3ppd cigarettes.    Past Surgical History:  Procedure Laterality Date  . CORONARY ANGIOPLASTY WITH STENT PLACEMENT    . CORONARY/GRAFT ACUTE MI REVASCULARIZATION N/A 07/16/2018   Procedure: Coronary/Graft Acute MI Revascularization;  Surgeon: Nelva Bush, MD;  Location: [place]  [facility];  Service: Cardiovascular;  Laterality: N/A;  . CORONARY/GRAFT ACUTE MI REVASCULARIZATION N/A 03/18/2019   Procedure: Coronary/Graft Acute MI Revascularization;  Surgeon: Nelva Bush, MD;  Location: [place] [facility];  Service: Cardiovascular;  Laterality: N/A;  . IABP INSERTION N/A 07/16/2018   Procedure: IABP Insertion;  Surgeon: Nelva Bush, MD;  Location: [place] [facility];  Service: Cardiovascular;  Laterality: N/A;  . LEFT HEART CATH AND CORONARY ANGIOGRAPHY N/A 07/16/2018   Procedure: LEFT HEART CATH AND CORONARY ANGIOGRAPHY;  Surgeon: Nelva Bush, MD;  Location: [place] [facility];  Service: Cardiovascular;  Laterality: N/A;  . LEFT HEART CATH AND CORONARY ANGIOGRAPHY N/A 03/18/2019   Procedure: LEFT HEART CATH AND CORONARY ANGIOGRAPHY;  Surgeon: Nelva Bush, MD;  Location: [place] [facility];  Service: Cardiovascular;  Laterality: N/A;    Medications Prior to Admission  Medication Sig Dispense Refill Last Dose  . aspirin EC 81 MG tablet Take 1 tablet (81 mg total) by mouth daily. 30 tablet 11 Past Week at Unknown time  . atorvastatin (LIPITOR) 80 MG tablet TAKE 1 TABLET BY MOUTH EVERY DAY AT 6 PM 30 tablet 6 Past Week at Unknown time  . flavoxATE (URISPAS) 100 MG tablet Take 100 mg by mouth as needed (urinary frequency).   prn at prn  . furosemide (LASIX) 40 MG tablet Take 1 tablet (40 mg) by mouth once a day for 2-3 days until weight down 5 lbs. Then take 1 tablet (40  mg) by mouth daily as needed for swelling. 30 tablet 5 prn at prn  . nitroGLYCERIN (NITROSTAT) 0.4 MG SL tablet Place 1 tablet (0.4 mg total) under the tongue every 5 (five) minutes x 3 doses as needed for chest pain. 25 tablet 2 prn at prn  . ticagrelor (BRILINTA) 90 MG TABS tablet Take 1 tablet (90 mg total) by mouth 2 (two) times daily. 180 tablet 2 Past Week at Unknown time   Social History   Socioeconomic History  . Marital status: Married    Spouse name: Not on file  . Number of  children: Not on file  . Years of education: Not on file  . Highest education level: Not on file  Occupational History  . Not on file  Tobacco Use  . Smoking status: Current Every Day Smoker    Types: Cigars  . Smokeless tobacco: Never Used  . Tobacco comment: 1 per day  Vaping Use  . Vaping Use: Never used  Substance and Sexual Activity  . Alcohol use: Yes    Comment: rare  . Drug use: Never  . Sexual activity: Not on file  Other Topics Concern  . Not on file  Social History Narrative  . Not on file   Social Determinants of Health   Financial Resource Strain:   . Difficulty of Paying Living Expenses: Not on file  Food Insecurity:   . Worried About Charity fundraiser in the Last Year: Not on file  . Ran Out of Food in the Last Year: Not on file  Transportation Needs:   . Lack of Transportation (Medical): Not on file  . Lack of Transportation (Non-Medical): Not on file  Physical Activity:   . Days of Exercise per Week: Not on file  . Minutes of Exercise per Session: Not on file  Stress:   . Feeling of Stress : Not on file  Social Connections:   . Frequency of Communication with Friends and Family: Not on file  . Frequency of Social Gatherings with Friends and Family: Not on file  . Attends Religious Services: Not on file  . Active Member of Clubs or Organizations: Not on file  . Attends Archivist Meetings: Not on file  . Marital Status: Not on file  Intimate Partner Violence:   . Fear of Current or Ex-Partner: Not on file  . Emotionally Abused: Not on file  . Physically Abused: Not on file  . Sexually Abused: Not on file    Family History  Problem Relation Age of Onset  . Cancer Mother   . Heart disease Father       Review of systems complete and found to be negative unless listed above      PHYSICAL EXAM  General: Well developed, well nourished, in no acute distress HEENT:  Normocephalic and atramatic Neck:  No JVD.  Lungs: Clear  bilaterally to auscultation and percussion. Heart: HRRR . Normal S1 and S2 without gallops or murmurs.  Abdomen: Bowel sounds are positive, abdomen soft and non-tender  Msk:  Back normal, normal gait. Normal strength and tone for age. Extremities: No clubbing, cyanosis or edema.   Neuro: Alert and oriented X 3. Psych:  Good affect, responds appropriately  Labs:   Lab Results  Component Value Date   WBC 8.2 02/11/2020   HGB 15.7 02/11/2020   HCT 47.8 02/11/2020   MCV 86.0 02/11/2020   PLT 135 (L) 02/11/2020    Recent Labs  Lab 02/11/20 0654  NA 139  K 3.4*  CL 105  CO2 19*  BUN 37*  CREATININE 0.98  CALCIUM 8.6*  PROT 6.4*  BILITOT 2.4*  ALKPHOS 116  ALT 37  AST 54*  GLUCOSE 107*   Lab Results  Component Value Date   TROPONINI 46.77 (HH) 07/17/2018    Lab Results  Component Value Date   CHOL 202 (H) 10/15/2019   CHOL 185 03/18/2019   CHOL 147 08/27/2018   Lab Results  Component Value Date   HDL 34 (L) 10/15/2019   HDL 40 (L) 03/18/2019   HDL 36 (L) 08/27/2018   Lab Results  Component Value Date   LDLCALC 146 (H) 10/15/2019   LDLCALC 119 (H) 03/18/2019   LDLCALC 87 08/27/2018   Lab Results  Component Value Date   TRIG 177 (H) 02/17/2020   TRIG 121 10/15/2019   TRIG 131 03/18/2019   Lab Results  Component Value Date   CHOLHDL 5.9 (H) 10/15/2019   CHOLHDL 4.6 03/18/2019   CHOLHDL 4.1 08/27/2018   Lab Results  Component Value Date   LDLDIRECT 140 (H) 10/15/2019      Radiology: CT ANGIO CHEST PE W OR WO CONTRAST  Result Date: 02/12/2020 CLINICAL DATA:  COVID-19 positive, hypoxia, short of breath EXAM: CT ANGIOGRAPHY CHEST WITH CONTRAST TECHNIQUE: Multidetector CT imaging of the chest was performed using the standard protocol during bolus administration of intravenous contrast. Multiplanar CT image reconstructions and MIPs were obtained to evaluate the vascular anatomy. CONTRAST:  33mL OMNIPAQUE IOHEXOL 350 MG/ML SOLN COMPARISON:  02/19/2020  FINDINGS: Cardiovascular: This is a technically adequate evaluation of the pulmonary vasculature. There are no filling defects or pulmonary emboli. There is marked left ventricular dilatation. No pericardial effusion. Normal caliber of the thoracic aorta. Moderate atherosclerosis of the aorta, with extensive atherosclerosis of the coronary vasculature. Mediastinum/Nodes: Mediastinal and hilar adenopathy likely reactive. Largest lymph node in the right paratracheal region measures 12 mm in short axis. Thyroid, trachea, and esophagus are unremarkable. Lungs/Pleura: There is bilateral multifocal ground-glass airspace disease superimposed upon background emphysema and mild bronchiectasis. No effusion or pneumothorax. Central airways are patent. Upper Abdomen: No acute abnormality. Musculoskeletal: No acute or destructive bony lesions. Reconstructed images demonstrate no additional findings. Review of the MIP images confirms the above findings. IMPRESSION: 1. No evidence of pulmonary embolus. 2. Bilateral multifocal ground-glass airspace disease consistent with COVID-19 pneumonia. 3. Marked left ventricular dilatation. 4. Mediastinal and hilar adenopathy, likely reactive. 5. Aortic Atherosclerosis (ICD10-I70.0) and Emphysema (ICD10-J43.9). Electronically Signed   By: Randa Ngo M.D.   On: 01/21/2020 17:59   DG Chest Port 1 View  Result Date: 02/19/2020 CLINICAL DATA:  Shortness of breath with hypoxemia.  No fever. EXAM: PORTABLE CHEST 1 VIEW COMPARISON:  Radiographs 03/20/2019 and 07/17/2018. FINDINGS: 1355 hours. Two views were obtained. The heart size and mediastinal contours are stable. Diffuse interstitial opacities are again noted in both lungs with an asymmetric component in the right upper lobe. Compared with the prior study, the current findings are less severe. There is no confluent airspace opacity, significant pleural effusion or pneumothorax. The bones appear intact. IMPRESSION: Overall improved  aeration of both lungs with persistent/recurrent diffuse interstitial opacities, most pronounced in the right upper lobe. Findings are most consistent with atypical edema or atypical infection. There could be a component of underlying fibrosis, and follow-up until clearing recommended. Electronically Signed   By: Richardean Sale M.D.   On: 01/30/2020 14:15    EKG: Sinus tachycardia left bundle branch block  ASSESSMENT AND PLAN:  Respiratory failure COVID-19 infection Ischemic cardiomyopathy Tobacco abuse Hyperlipidemia Hypotension . Plan Agree with hospitalization and treatment for Covid pneumonia Agree with steroid therapy and broad-spectrum antibiotic therapy as necessary supplemental oxygen Patient to receive remdesivir to help with treatment and recovery Continue bronchodilators and cough suppressants Advised patient to quit smoking On further review of his medical records it appears to me that he seen Dr. Saunders Revel in the past the patient did not offer this information initially because he did not respond very much to too many questions. I will alert Dr. Saunders Revel team to see if they are willing to take over the case No direct cardiac input at this point to supportive management Heart failure appears to be reasonably compensated  Signed: Yolonda Kida MD 02/11/2020, 2:40 PM

## 2020-02-11 NOTE — Progress Notes (Signed)
PROGRESS NOTE    Nicholas Schultz  FSE:[id number] DOB: 01-29-1946 DOA: 01/28/2020 PCP: Patient, [name] Per   Brief Narrative:  Nicholas Schultz a 74 y.o.malewith medical history significant forcoronary artery disease, heart failure with reduced EF(last known LVEF 30 to 35%),history of ischemic cardiomyopathy, nicotine dependence who presents to the emergency room for evaluation of worsening shortness of breath. Patient was recently exposed to a family member who had COVID-19 infection and had a positive Covid PCR test about 10 days ago. He is unvaccinated.  His wife has been checking his pulse oximetry at home and today noted that his pulse oximetry had been about 86% which prompted patient's visit to the emergency room for further evaluation. He complains of a fever and chills as well as myalgias and weakness but denies having any cough. He has nausea but no vomiting. He denies having any chest pain, no abdominal pain, no dizziness, no lightheadedness, no urinary symptoms or changes in his bowel habits. Labs show sodium 139, potassium 3.9, chloride 103, bicarb 21, BUN 101, BUN 35, creatinine 1.35, calcium 8.4, alkaline phosphatase 129, ALT 35, AST 80, total protein 6.6, LDH1100,troponin38,ferritin3623, lactic acid4.0,procalcitonin 0.18,white count 12.1, hemoglobin 15.1, hematocrit 44.5, MCV 84.1, RDW 16.8, fibrin derivatives 7,5000 Chest x-ray reviewed by me shows bilateral interstitial opacities. Lead EKG shows sinus tachycardia, PVCs and left bundle branch block  Patient is a 74 year old male with a history of ischemic cardiomyopathy, chronic systolic heart failure with last known LVEF of 30 to 35% who presents to the emergency room via EMS for evaluation of hypoxia. Patient had a positive Covid test about 10 days ago and has been checking his pulse oximetry at home.Patient noted to have room air pulse oximetry of 86% prompting a visit to the emergency room. Patient has been  hypotensive with systolic blood pressure in the 80s but is awake and alert. He received 1 L IV fluid hydration in the ER with improvement in his blood pressure. He also received remdesivir and prednisone. He is currently on high flow oxygen and will be admitted to the hospital for further evaluation.    Assessment & Plan:   Principal Problem:   Acute respiratory failure due to COVID-19 [facility]) Active Problems:   Chronic systolic heart failure (HCC)   Lactic acidosis Acute hypoxic respiratory failure and severe sepsis secondary to COVID-19 pneumonia:  Patient meets severe sepsis criteria based on tachycardia, tachypnea, leukocytosis, hypoxia and lactic acid of 4.0.  Patient was requiring up to 13 L of high flow oxygen up until last night but this morning, he states that he was feeling better and currently he is on 6 L of high flow.  Significantly elevated D-dimer.  CT angiogram negative for PE.  Slight improvement in inflammatory markers otherwise.  Continue to wean oxygen.  Repeat lactic acid.  Continue following Remdesivir per pharmacy protocol IV Solu-Medrol Bronchodilator, antitussive.  Patient was encouraged to prone, out of bed to chair, to use incentive spirometry and flutter valve. Baricitinib  off label use -patient himself too confused to comprehend benefits and risks of this medication and thus he is not in a position to provide any consent.  Patient's wife was told that if COVID-19 pneumonitis gets worse we might potentially use Actemra/ barititinib off label, she told me that patient has no known history of active diverticulitis, tuberculosis or hepatitis, understands the risks and benefits and wants to proceed with Actemra/barititinib and thus I will start him on baricitinib. The treatment plan and use of medications and  known side effects were discussed with patient/family, they were clearly explained that there is no proven definitive treatment for COVID-19 infection, any medications  used here are based on published clinical articles/anecdotal data which are not peer-reviewed or randomized control trials.  Complete risks and long-term side effects are unknown, however in the best clinical judgment they seem to be of some clinical benefit rather than medical risks.  Patient/family agree with the treatment plan and want to receive the given medications. -9/22 As pt is still requiring high amount of oxygen and NR we will continue to provide him with supplemental  Oxygen, and steroids. Will start pt on empiric t/t of PE due to elevated fibrin level. We will also start pt on anticoagulation to prevent any vte's.     Chronic systolic congestive heart failure:  Patient's LVEF is 30 to 35% per echo done previously.  He was hypotensive in the ED and received 1 L of IV fluid.  Lasix were held because of that.  Now his blood pressure is better.  We will give him 1 dose of Lasix 20 mg IV in effort to keep him dry and prevent fluid overload which is proved to be detrimental in COVID-19 patients.  Will reassess daily for further Lasix doses.  Continue aspirin and Brilinta.have requested cardiology consult for reduced ef chf and hypoperfusion.    Metabolic acidosis due to lactic acid level: ? If this is infectious or noninfectious etiology. We will treat empirically with iv abx and vancomycin and zosyn.   we will start pt on Lovenox and monitor.  ABG is pending.    DVT prophylaxis:  Code Status:  Family Communication:  Disposition Plan:   Status is: Inpatient Dispo: The patient is from: Home              Anticipated d/c is to: SNF              Anticipated d/c date is: > 3 days              Patient currently is not medically stable to d/c.   Consultants:  Cardiology; Dr. Clayborn Bigness.   Subjective: Pt is alert and answers questions but is sleepy. Vitals are stable but his lactic acid has been elevated and has remained elevated since yesterday. D/D include Sepsis/ Ischemia. Pt has  been admitted for covid PNA and is on HFNC at 15L.Intermittenyl has been hypoxic , will dw/ Dr. Mortimer Fries if pt can be transferred to ICU for worsening covid-19 related respiratory failure.   Objective: Vitals:   02/11/20 0840 02/11/20 0944 02/11/20 1004 02/11/20 1152  BP:    90/68  Pulse:    62  Resp:    20  Temp:    97.6 F (36.4 C)  TempSrc:      SpO2: (!) 85% 93% 92% 99%  Weight:      Height:        Intake/Output Summary (Last 24 hours) at 02/11/2020 1348 Last data filed at 02/11/2020 1300 Gross per 24 hour  Intake 440 ml  Output 500 ml  Net -60 ml   Filed Weights   02/13/2020 1348  Weight: 77.1 kg    Examination:  General exam: Appears calm and comfortable  Respiratory system: Clear to auscultation. Respiratory effort normal. Cardiovascular system: S1 & S2 heard, RRR. No JVD, murmurs, rubs, gallops or clicks. No pedal edema. Gastrointestinal system: Abdomen is nondistended, soft and nontender. No organomegaly or masses felt. Normal bowel sounds heard. Central nervous system:  Alert and oriented. No focal neurological deficits. Extremities: Symmetric 5 x 5 power. Skin: No rashes, lesions or ulcers Psychiatry: Judgement and insight appear normal. Mood & affect appropriate.     Data Reviewed: I have personally reviewed following labs and imaging studies  I/O last 3 completed shifts: In: 660 [P.O.:560; IV Piggyback:100] Out: 300 [Urine:300] Total I/O In: 220 [P.O.:220] Out: 200 [Urine:200] Lab Results  Component Value Date   CREATININE 0.98 02/11/2020   CREATININE 1.14 02/10/2020   CREATININE 1.35 (H) 01/26/2020   CBC: Recent Labs  Lab 01/25/2020 1347 02/10/20 0520 02/11/20 0654  WBC 12.1* 10.7* 8.2  NEUTROABS 10.1* 8.8* 6.9  HGB 15.1 14.9 15.7  HCT 44.5 44.0 47.8  MCV 84.1 83.5 86.0  PLT 172 155 272*   Basic Metabolic Panel: Recent Labs  Lab 02/12/2020 1347 02/10/20 0520 02/11/20 0654  NA 139 143 139  K 3.9 5.4* 3.4*  CL 103 107 105  CO2 21* 24 19*    GLUCOSE 101* 127* 107*  BUN 35* 35* 37*  CREATININE 1.35* 1.14 0.98  CALCIUM 8.4* 8.9 8.6*  MG  --  2.8* 2.4  PHOS  --  4.6 3.4   GFR: Estimated Creatinine Clearance: 69.4 mL/min (by C-G formula based on SCr of 0.98 mg/dL). Liver Function Tests: Recent Labs  Lab 01/29/2020 1347 02/10/20 0520 02/11/20 0654  AST 80* 67* 54*  ALT 35 35 37  ALKPHOS 129* 120 116  BILITOT 2.2* 2.0* 2.4*  PROT 6.6 6.6 6.4*  ALBUMIN 2.9* 2.9* 2.7*   No results for input(s): LIPASE, AMYLASE in the last 168 hours. No results for input(s): AMMONIA in the last 168 hours. Coagulation Profile: No results for input(s): INR, PROTIME in the last 168 hours. Cardiac Enzymes: No results for input(s): CKTOTAL, CKMB, CKMBINDEX, TROPONINI in the last 168 hours. BNP (last 3 results) No results for input(s): PROBNP in the last 8760 hours. HbA1C: No results for input(s): HGBA1C in the last 72 hours. CBG: No results for input(s): GLUCAP in the last 168 hours. Lipid Profile: Recent Labs    02/10/2020 1347  TRIG 177*   Thyroid Function Tests: No results for input(s): TSH, T4TOTAL, FREET4, T3FREE, THYROIDAB in the last 72 hours. Anemia Panel: Recent Labs    02/10/20 0520 02/11/20 0654  FERRITIN 2,728* 1,461*   Sepsis Labs: Recent Labs  Lab 02/14/2020 1347 01/31/2020 1405 02/10/20 1424 02/10/20 1701 02/11/20 0845 02/11/20 1224  PROCALCITON 0.18  --   --   --   --   --   LATICACIDVEN  --    < > 2.4* 3.2* 4.2* 4.6*   < > = values in this interval not displayed.    Recent Results (from the past 240 hour(s))  SARS Coronavirus 2 by RT PCR (hospital order, performed in [facility] hospital lab) Nasopharyngeal Nasopharyngeal Swab     Status: Abnormal   Collection Time: 02/18/2020  2:05 PM   Specimen: Nasopharyngeal Swab  Result Value Ref Range Status   SARS Coronavirus 2 POSITIVE (A) NEGATIVE Final    Comment: RESULT CALLED TO, READ BACK BY AND VERIFIED WITH: AMY COWEN 02/02/2020 AT 1534 BY  ACR (NOTE) SARS-CoV-2 target nucleic acids are DETECTED  SARS-CoV-2 RNA is generally detectable in upper respiratory specimens  during the acute phase of infection.  Positive results are indicative  of the presence of the identified virus, but do not rule out bacterial infection or co-infection with other pathogens not detected by the test.  Clinical correlation with patient history  and  other diagnostic information is necessary to determine patient infection status.  The expected result is negative.  Fact Sheet for Patients:   StrictlyIdeas.no   Fact Sheet for Healthcare Providers:   BankingDealers.co.za    This test is not yet approved or cleared by the [place] FDA and  has been authorized for detection and/or diagnosis of SARS-CoV-2 by FDA under an Emergency Use Authorization (EUA).  This EUA will remain in effect (meaning this test  can be used) for the duration of  the COVID-19 declaration under Section 564(b)(1) of the Act, 21 U.S.C. section 360-bbb-3(b)(1), unless the authorization is terminated or revoked sooner.  Performed at [facility], [address] [place], [place] [address]   Blood Culture (routine x 2)     Status: None (Preliminary result)   Collection Time: 02/10/2020  2:20 PM   Specimen: BLOOD  Result Value Ref Range Status   Specimen Description BLOOD BLOOD LEFT ARM  Final   Special Requests   Final    BOTTLES DRAWN AEROBIC AND ANAEROBIC Blood Culture adequate volume   Culture   Final    NO GROWTH 2 DAYS Performed at [facility], [address], [place], [place] [address]    Report Status PENDING  Incomplete  Blood Culture (routine x 2)     Status: None (Preliminary result)   Collection Time: 01/31/2020  2:28 PM   Specimen: BLOOD  Result Value Ref Range Status   Specimen Description BLOOD RIGHT ANTECUBITAL  Final   Special Requests   Final    BOTTLES DRAWN AEROBIC AND ANAEROBIC  Blood Culture adequate volume   Culture   Final    NO GROWTH 2 DAYS Performed at [facility], [address] [place], [place] [address]    Report Status PENDING  Incomplete         Radiology Studies: CT ANGIO CHEST PE W OR WO CONTRAST  Result Date: 02/14/2020 CLINICAL DATA:  COVID-19 positive, hypoxia, short of breath EXAM: CT ANGIOGRAPHY CHEST WITH CONTRAST TECHNIQUE: Multidetector CT imaging of the chest was performed using the standard protocol during bolus administration of intravenous contrast. Multiplanar CT image reconstructions and MIPs were obtained to evaluate the vascular anatomy. CONTRAST:  57mL OMNIPAQUE IOHEXOL 350 MG/ML SOLN COMPARISON:  01/23/2020 FINDINGS: Cardiovascular: This is a technically adequate evaluation of the pulmonary vasculature. There are no filling defects or pulmonary emboli. There is marked left ventricular dilatation. No pericardial effusion. Normal caliber of the thoracic aorta. Moderate atherosclerosis of the aorta, with extensive atherosclerosis of the coronary vasculature. Mediastinum/Nodes: Mediastinal and hilar adenopathy likely reactive. Largest lymph node in the right paratracheal region measures 12 mm in short axis. Thyroid, trachea, and esophagus are unremarkable. Lungs/Pleura: There is bilateral multifocal ground-glass airspace disease superimposed upon background emphysema and mild bronchiectasis. No effusion or pneumothorax. Central airways are patent. Upper Abdomen: No acute abnormality. Musculoskeletal: No acute or destructive bony lesions. Reconstructed images demonstrate no additional findings. Review of the MIP images confirms the above findings. IMPRESSION: 1. No evidence of pulmonary embolus. 2. Bilateral multifocal ground-glass airspace disease consistent with COVID-19 pneumonia. 3. Marked left ventricular dilatation. 4. Mediastinal and hilar adenopathy, likely reactive. 5. Aortic Atherosclerosis (ICD10-I70.0) and Emphysema  (ICD10-J43.9). Electronically Signed   By: Randa Ngo M.D.   On: 01/24/2020 17:59   DG Chest Port 1 View  Result Date: 01/29/2020 CLINICAL DATA:  Shortness of breath with hypoxemia.  No fever. EXAM: PORTABLE CHEST 1 VIEW COMPARISON:  Radiographs 03/20/2019 and  07/17/2018. FINDINGS: 1355 hours. Two views were obtained. The heart size and mediastinal contours are stable. Diffuse interstitial opacities are again noted in both lungs with an asymmetric component in the right upper lobe. Compared with the prior study, the current findings are less severe. There is no confluent airspace opacity, significant pleural effusion or pneumothorax. The bones appear intact. IMPRESSION: Overall improved aeration of both lungs with persistent/recurrent diffuse interstitial opacities, most pronounced in the right upper lobe. Findings are most consistent with atypical edema or atypical infection. There could be a component of underlying fibrosis, and follow-up until clearing recommended. Electronically Signed   By: Richardean Sale M.D.   On: 02/13/2020 14:15        Scheduled Meds: . albuterol  2 puff Inhalation Q6H  . vitamin C  500 mg Oral Daily  . aspirin EC  81 mg Oral Daily  . atorvastatin  80 mg Oral Daily  . baricitinib  4 mg Oral Daily  . Chlorhexidine Gluconate Cloth  6 each Topical Q0600  . enoxaparin (LOVENOX) injection  1 mg/kg (Adjusted) Subcutaneous Q12H  . methylPREDNISolone (SOLU-MEDROL) injection  0.5 mg/kg Intravenous Q12H   Followed by  . [START ON 02/13/2020] predniSONE  50 mg Oral Daily  . sodium chloride flush  3 mL Intravenous Q12H  . ticagrelor  90 mg Oral BID  . zinc sulfate  220 mg Oral Daily   Continuous Infusions: . sodium chloride    . piperacillin-tazobactam    . remdesivir 100 mg in NS 100 mL 100 mg (02/11/20 0844)     LOS: 2 days    Para Skeans, MD [facility] Pager [phone] If 7PM-7AM, please contact night-coverage [email or web address] Password  [email or web address] 02/11/2020, 1:48 PM

## 2020-02-11 NOTE — Consult Note (Signed)
Consultation Note Date: 02/11/2020   Patient Name: Nicholas Schultz  DOB: 1945/11/30  MRN: [id number]  Age / Sex: 74 y.o., male  PCP: Patient, [name] Per Referring Physician: Para Skeans, MD  Reason for Consultation: Establishing goals of care  HPI/Patient Profile: 74 y.o. male  with past medical history of CAD, HF (EF 30-25%), and tobacco use admitted on 01/29/2020 with COVID-19 pneumonia. Has required 10-15 L HFNC. Drowsy and slightly confused. Poor PO intake. BP soft. PMT consulted to discuss [place].    Clinical Assessment and Goals of Care: I have reviewed medical records including EPIC notes, labs and imaging, received report from RN, then spoke with patient's wife, Nicholas Schultz,  to discuss diagnosis prognosis, GOC, EOL wishes, disposition and options.  I introduced Palliative Medicine as specialized medical care for people living with serious illness. It focuses on providing relief from the symptoms and stress of a serious illness. The goal is to improve quality of life for both the patient and the family.  We discussed a brief life review of the patient. She tells me they have been together >50 years. She tells me they moved to the area about 4 years ago.  As far as functional and nutritional status, she tells me [name] had good function and was able to care for himself. No concerns about appetite. Some cognitive changes - forgetful and easily confused - she had been concerned about early dementia.    We discussed patient's current illness and what it means in the larger context of patient's on-going co-morbidities.  Natural disease trajectory and expectations at EOL were discussed. We discussed high oxygen requirement, poor PO intake, AMS, soft BP, and lab work. Discussed concern about how patient will do.  Nicholas Schultz is surprised that [name] is so ill - she tells me her whole family and elderly neighbors have had COVID and all recovered well - she had  expected the same for [name]. I prepared Nicholas Schultz to plan for a prolonged hospital stay as it is uncertain how he will do given how sick he is and comorbidity.   I attempted to elicit values and goals of care important to the patient.  The difference between aggressive medical intervention and comfort care was considered in light of the patient's goals of care. Code status was discussed. Nicholas Schultz shares that [name] has told her he "doesn't want to live on a machine" but he would want CPR. I attempted to gain clarity - Nicholas Schultz tells me at this time she would want patient to remain Full code - she would want short-term ventilator support if required. Will continue to discuss.  Discussed with Nicholas Schultz the importance of continued conversation with family and the medical providers regarding overall plan of care and treatment options, ensuring decisions are within the context of the patient's values and GOCs.    Questions and concerns were addressed. The family was encouraged to call with questions or concerns.   Primary Decision Maker NEXT OF KIN - wife Nicholas Schultz   SUMMARY OF RECOMMENDATIONS   - full code/full scope (would want "short-term" ventilator support) - PMT will continue [place] discussions with wife  Code Status/Advance Care Planning:  Full code  Prognosis:   Unable to determine  Discharge Planning: To Be Determined      Primary Diagnoses: Present on Admission: . Chronic systolic heart failure ([place]) . Acute respiratory failure due to COVID-19 [facility])   I have reviewed the medical record, interviewed the patient and family, and examined the patient.  The following aspects are pertinent.  Past Medical History:  Diagnosis Date  . CAD (coronary artery disease)    a. s/p prior MI and stenting x 4 in [place], [place]; b. 06/2018 Ant STEMI/PCI: LM mod dzs, LAD 50ost, 99p (2.75x16 Synergy DES), 30p/m ISR, LCX nl, OM1 90, OM2 mod dzs, RCA large, 76m ISR, RPDA 70ost, RPAV 50.  [name] HFrEF (heart  failure with reduced ejection fraction) ([place])    a. 06/2018 Echo: EF 30-35%.  . Ischemic cardiomyopathy    a. 06/2018 Echo: EF 30-35%, apical septal, mid and apical lateral, apical, apical inf, mid and apical ant, and mid antsept AK w/ inflat HK.   . Tobacco abuse    a. 1 cigar/day. Prev smoked 3ppd cigarettes.   Social History   Socioeconomic History  . Marital status: Married    Spouse name: Not on file  . Number of children: Not on file  . Years of education: Not on file  . Highest education level: Not on file  Occupational History  . Not on file  Tobacco Use  . Smoking status: Current Every Day Smoker    Types: Cigars  . Smokeless tobacco: Never Used  . Tobacco comment: 1 per day  Vaping Use  . Vaping Use: Never used  Substance and Sexual Activity  . Alcohol use: Yes    Comment: rare  . Drug use: Never  . Sexual activity: Not on file  Other Topics Concern  . Not on file  Social History Narrative  . Not on file   Social Determinants of Health   Financial Resource Strain:   . Difficulty of Paying Living Expenses: Not on file  Food Insecurity:   . Worried About Charity fundraiser in the Last Year: Not on file  . Ran Out of Food in the Last Year: Not on file  Transportation Needs:   . Lack of Transportation (Medical): Not on file  . Lack of Transportation (Non-Medical): Not on file  Physical Activity:   . Days of Exercise per Week: Not on file  . Minutes of Exercise per Session: Not on file  Stress:   . Feeling of Stress : Not on file  Social Connections:   . Frequency of Communication with Friends and Family: Not on file  . Frequency of Social Gatherings with Friends and Family: Not on file  . Attends Religious Services: Not on file  . Active Member of Clubs or Organizations: Not on file  . Attends Archivist Meetings: Not on file  . Marital Status: Not on file   Family History  Problem Relation Age of Onset  . Cancer Mother   . Heart disease  Father    Scheduled Meds: . albuterol  2 puff Inhalation Q6H  . vitamin C  500 mg Oral Daily  . aspirin EC  81 mg Oral Daily  . atorvastatin  80 mg Oral Daily  . baricitinib  4 mg Oral Daily  . Chlorhexidine Gluconate Cloth  6 each Topical Q0600  . enoxaparin (LOVENOX) injection  1 mg/kg (Adjusted) Subcutaneous Q12H  . methylPREDNISolone (SOLU-MEDROL) injection  0.5 mg/kg Intravenous Q12H   Followed by  . [START ON 02/13/2020] predniSONE  50 mg Oral Daily  . sodium chloride flush  3 mL Intravenous Q12H  . ticagrelor  90 mg Oral BID  . zinc sulfate  220 mg Oral Daily   Continuous Infusions: . sodium chloride Stopped (02/11/20 1502)  . piperacillin-tazobactam (ZOSYN)  IV 12.5 mL/hr  at 02/11/20 1506  . remdesivir 100 mg in NS 100 mL Stopped (02/11/20 0939)  . vancomycin 150 mL/hr at 02/11/20 1506   Followed by  . [START ON 02/12/2020] vancomycin     PRN Meds:.acetaminophen, flavoxATE, guaiFENesin-dextromethorphan, nitroGLYCERIN, ondansetron **OR** ondansetron (ZOFRAN) IV, sodium chloride flush No Known Allergies  Vital Signs: BP 90/68 (BP Location: Left Arm)   Pulse 100   Temp 97.6 F (36.4 C)   Resp 14   Ht 5\' 11"  (1.803 m)   Wt 77.1 kg   SpO2 95%   BMI 23.71 kg/m  Pain Scale: 0-10   Pain Score: 0-No pain   SpO2: SpO2: 95 % O2 Device:SpO2: 95 % O2 Flow Rate: .O2 Flow Rate (L/min): 10 L/min  IO: Intake/output summary:   Intake/Output Summary (Last 24 hours) at 02/11/2020 1541 Last data filed at 02/11/2020 1506 Gross per 24 hour  Intake 755.08 ml  Output 775 ml  Net -19.92 ml    LBM: Last BM Date: 02/07/20 Baseline Weight: Weight: 77.1 kg Most recent weight: Weight: 77.1 kg     Palliative Assessment/Data: PPS 20%    The above conversation was completed via telephone due to the visitor restrictions during the COVID-19 pandemic. Thorough chart review and discussion with necessary members of the care team was completed as part of assessment. All issues were  discussed and addressed but no physical exam was performed.   Time Total: 65 minutes Greater than 50%  of this time was spent counseling and coordinating care related to the above assessment and plan.  Juel Burrow, DNP, AGNP-C Palliative Medicine Team [phone] Pager: [phone]

## 2020-02-11 NOTE — Progress Notes (Signed)
Dr Girard Cooter made aware of critical 4.2 lactic, acknowledged

## 2020-02-11 NOTE — Progress Notes (Signed)
Pt removes HFNC desats into 70s, pt educated on importance of keeping o2 in place, states understanding

## 2020-02-11 NOTE — Consult Note (Signed)
Pharmacy Antibiotic Note  Nicholas Schultz is a 74 y.o. male admitted on 02/10/2020 with sepsis.  Pharmacy has been consulted for Vancomycin dosing.  Plan: Will start with Vancomycin 1500mg  loading dose, followed by Vancomycin 1000 IV every 12 hours.  Goal trough 15-20 mcg/mL.   Will continue Zosyn 3.375g extended infusion q8h  Height: 5\' 11"  (180.3 cm) Weight: 77.1 kg (170 lb) IBW/kg (Calculated) : 75.3  Temp (24hrs), Avg:98.2 F (36.8 C), Min:97.6 F (36.4 C), Max:98.8 F (37.1 C)  Recent Labs  Lab 02/08/2020 1347 02/01/2020 1405 02/10/20 0520 02/10/20 1424 02/10/20 1701 02/11/20 0654 02/11/20 0845 02/11/20 1224  WBC 12.1*  --  10.7*  --   --  8.2  --   --   CREATININE 1.35*  --  1.14  --   --  0.98  --   --   LATICACIDVEN  --    < > 3.5* 2.4* 3.2*  --  4.2* 4.6*   < > = values in this interval not displayed.    Estimated Creatinine Clearance: 69.4 mL/min (by C-G formula based on SCr of 0.98 mg/dL).    No Known Allergies  Antimicrobials this admission: Remdesivir 9/20 >> Vancomycin 9/22 >> Zosyn 9/22 >>  Dose adjustments this admission: none  Microbiology results: 9/20 BCx:NG x 2 days 9/22 BCx pending  9/20 COVID +  Thank you for allowing pharmacy to be a part of this patient's care.  Lu Duffel, PharmD, BCPS Clinical Pharmacist 02/11/2020 1:59 PM

## 2020-02-11 NOTE — Progress Notes (Signed)
Dr Girard Cooter made aware of critical lactic 4.6, acknowledged

## 2020-02-11 NOTE — Progress Notes (Addendum)
Daily update given to wife

## 2020-02-12 ENCOUNTER — Inpatient Hospital Stay: Payer: Medicare HMO

## 2020-02-12 ENCOUNTER — Encounter: Payer: Self-pay | Admitting: Internal Medicine

## 2020-02-12 LAB — COMPREHENSIVE METABOLIC PANEL
ALT: 38 U/L (ref 0–44)
AST: 56 U/L — ABNORMAL HIGH (ref 15–41)
Albumin: 2.3 g/dL — ABNORMAL LOW (ref 3.5–5.0)
Alkaline Phosphatase: 104 U/L (ref 38–126)
Anion gap: 9 (ref 5–15)
BUN: 35 mg/dL — ABNORMAL HIGH (ref 8–23)
CO2: 25 mmol/L (ref 22–32)
Calcium: 8.1 mg/dL — ABNORMAL LOW (ref 8.9–10.3)
Chloride: 106 mmol/L (ref 98–111)
Creatinine, Ser: 1.07 mg/dL (ref 0.61–1.24)
GFR calc Af Amer: >60 mL/min (ref >60)
GFR calc non Af Amer: >60 mL/min (ref >60)
Glucose, Bld: 175 mg/dL — ABNORMAL HIGH (ref 70–99)
Potassium: 4 mmol/L (ref 3.5–5.1)
Sodium: 140 mmol/L (ref 135–145)
Total Bilirubin: 2.7 mg/dL — ABNORMAL HIGH (ref 0.3–1.2)
Total Protein: 5.6 g/dL — ABNORMAL LOW (ref 6.5–8.1)

## 2020-02-12 LAB — RETICULOCYTES
Immature Retic Fract: 15.9 % (ref 2.3–15.9)
RBC.: 4.3 MIL/uL (ref 4.22–5.81)
Retic Count, Absolute: 48.6 10*3/uL (ref 19.0–186.0)
Retic Ct Pct: 1.1 % (ref 0.4–3.1)

## 2020-02-12 LAB — BLOOD CULTURE ID PANEL (REFLEXED) - BCID2

## 2020-02-12 LAB — IRON AND TIBC
Iron: 28 ug/dL — ABNORMAL LOW (ref 45–182)
Saturation Ratios: 21 % (ref 17.9–39.5)
TIBC: 136 ug/dL — ABNORMAL LOW (ref 250–450)
UIBC: 108 ug/dL

## 2020-02-12 LAB — BASIC METABOLIC PANEL
Anion gap: 12 (ref 5–15)
BUN: 35 mg/dL — ABNORMAL HIGH (ref 8–23)
CO2: 21 mmol/L — ABNORMAL LOW (ref 22–32)
Calcium: 8.1 mg/dL — ABNORMAL LOW (ref 8.9–10.3)
Chloride: 106 mmol/L (ref 98–111)
Creatinine, Ser: 1.08 mg/dL (ref 0.61–1.24)
GFR calc Af Amer: >60 mL/min (ref >60)
GFR calc non Af Amer: >60 mL/min (ref >60)
Glucose, Bld: 134 mg/dL — ABNORMAL HIGH (ref 70–99)
Potassium: 3.7 mmol/L (ref 3.5–5.1)
Sodium: 139 mmol/L (ref 135–145)

## 2020-02-12 LAB — MAGNESIUM
Magnesium: 2.4 mg/dL (ref 1.7–2.4)
Magnesium: 2.6 mg/dL — ABNORMAL HIGH (ref 1.7–2.4)

## 2020-02-12 LAB — CBC WITH DIFFERENTIAL/PLATELET
Abs Immature Granulocytes: 0.02 10*3/uL (ref 0.00–0.07)
Basophils Absolute: 0 10*3/uL (ref 0.0–0.1)
Basophils Relative: 0 %
Eosinophils Absolute: 0 10*3/uL (ref 0.0–0.5)
Eosinophils Relative: 0 %
HCT: 36.5 % — ABNORMAL LOW (ref 39.0–52.0)
Hemoglobin: 12.5 g/dL — ABNORMAL LOW (ref 13.0–17.0)
Immature Granulocytes: 0 %
Lymphocytes Relative: 12 %
Lymphs Abs: 0.6 10*3/uL — ABNORMAL LOW (ref 0.7–4.0)
MCH: 28.5 pg (ref 26.0–34.0)
MCHC: 34.2 g/dL (ref 30.0–36.0)
MCV: 83.1 fL (ref 80.0–100.0)
Monocytes Absolute: 0.2 10*3/uL (ref 0.1–1.0)
Monocytes Relative: 4 %
Neutro Abs: 4.1 10*3/uL (ref 1.7–7.7)
Neutrophils Relative %: 84 %
Platelets: 122 10*3/uL — ABNORMAL LOW (ref 150–400)
RBC: 4.39 MIL/uL (ref 4.22–5.81)
RDW: 16.8 % — ABNORMAL HIGH (ref 11.5–15.5)
Smear Review: NORMAL
WBC: 4.9 10*3/uL (ref 4.0–10.5)
nRBC: 0 % (ref 0.0–0.2)

## 2020-02-12 LAB — TYPE AND SCREEN
ABO/RH(D): A POS
Antibody Screen: NEGATIVE

## 2020-02-12 LAB — TROPONIN I (HIGH SENSITIVITY)
Troponin I (High Sensitivity): 26 ng/L — ABNORMAL HIGH (ref <18)
Troponin I (High Sensitivity): 28 ng/L — ABNORMAL HIGH (ref <18)

## 2020-02-12 LAB — C-REACTIVE PROTEIN: CRP: 11.9 mg/dL — ABNORMAL HIGH (ref <1.0)

## 2020-02-12 LAB — FOLATE: Folate: 8.3 ng/mL (ref >5.9)

## 2020-02-12 LAB — VITAMIN B12: Vitamin B-12: 2509 pg/mL — ABNORMAL HIGH (ref 180–914)

## 2020-02-12 LAB — HEMOGLOBIN AND HEMATOCRIT, BLOOD
HCT: 36.2 % — ABNORMAL LOW (ref 39.0–52.0)
HCT: 35.9 % — ABNORMAL LOW (ref 39.0–52.0)
Hemoglobin: 12.1 g/dL — ABNORMAL LOW (ref 13.0–17.0)
Hemoglobin: 11.9 g/dL — ABNORMAL LOW (ref 13.0–17.0)

## 2020-02-12 LAB — T4, FREE: Free T4: 1.02 ng/dL (ref 0.61–1.12)

## 2020-02-12 LAB — FIBRIN DERIVATIVES D-DIMER (ARMC ONLY): Fibrin derivatives D-dimer (ARMC): 6480.77 ng/mL (FEU) — ABNORMAL HIGH (ref 0.00–499.00)

## 2020-02-12 LAB — LACTIC ACID, PLASMA
Lactic Acid, Venous: 2.3 mmol/L (ref 0.5–1.9)
Lactic Acid, Venous: 3.9 mmol/L (ref 0.5–1.9)

## 2020-02-12 LAB — LACTATE DEHYDROGENASE: LDH: 738 U/L — ABNORMAL HIGH (ref 98–192)

## 2020-02-12 LAB — PHOSPHORUS: Phosphorus: 3.6 mg/dL (ref 2.5–4.6)

## 2020-02-12 LAB — FERRITIN: Ferritin: 1012 ng/mL — ABNORMAL HIGH (ref 24–336)

## 2020-02-12 LAB — TSH: TSH: 1.025 u[IU]/mL (ref 0.350–4.500)

## 2020-02-12 MED ORDER — LACTULOSE 10 GM/15ML PO SOLN
10.0000 g | Freq: Once | ORAL | Status: AC
  Administered 2020-02-12: 14:00:00 10 g via ORAL
  Filled 2020-02-12: qty 30

## 2020-02-12 MED ORDER — HEPARIN SODIUM (PORCINE) 5000 UNIT/ML IJ SOLN
5000.0000 [IU] | Freq: Three times a day (TID) | INTRAMUSCULAR | Status: DC
  Administered 2020-02-12 – 2020-02-16 (×12): 5000 [IU] via SUBCUTANEOUS
  Filled 2020-02-12 (×12): qty 1

## 2020-02-12 MED ORDER — IOHEXOL 350 MG/ML SOLN
75.0000 mL | Freq: Once | INTRAVENOUS | Status: AC | PRN
  Administered 2020-02-12: 11:00:00 75 mL via INTRAVENOUS

## 2020-02-12 MED ORDER — ADULT MULTIVITAMIN W/MINERALS CH
1.0000 | ORAL_TABLET | Freq: Every day | ORAL | Status: DC
  Administered 2020-02-13 – 2020-02-22 (×10): 1 via ORAL
  Filled 2020-02-12 (×11): qty 1

## 2020-02-12 MED ORDER — ENSURE ENLIVE PO LIQD
237.0000 mL | Freq: Three times a day (TID) | ORAL | Status: DC
  Administered 2020-02-12 – 2020-02-19 (×20): 237 mL via ORAL

## 2020-02-12 NOTE — Progress Notes (Signed)
Attempted to give daily update to wife, no answer, awaiting return call

## 2020-02-12 NOTE — Progress Notes (Signed)
Dr Girard Cooter made aware that pt with no documented BM since 9/18, acknowledged message, no new orders at this time

## 2020-02-12 NOTE — Progress Notes (Signed)
PHARMACY - PHYSICIAN COMMUNICATION CRITICAL VALUE ALERT - BLOOD CULTURE IDENTIFICATION (BCID)  Nicholas Schultz is an 74 y.o. male who presented to [facility] on 02/06/2020 with a chief complaint of shortness of breath.  Assessment:  2/4 bottles in one set grew gram + rods  (include suspected source if known)  Name of physician (or Provider) ContactedRachael Fee, NP  Current antibiotics: Pip/tazo & vanc  Changes to prescribed antibiotics recommended: No change to current therapy.  Recommendations accepted by provider  Results for orders placed or performed during the hospital encounter of 02/08/2020  Blood Culture ID Panel (Reflexed) (Collected: 02/11/2020  2:24 PM)  Result Value Ref Range   Enterococcus faecalis NOT DETECTED NOT DETECTED   Enterococcus Faecium NOT DETECTED NOT DETECTED   Listeria monocytogenes NOT DETECTED NOT DETECTED   Staphylococcus species NOT DETECTED NOT DETECTED   Staphylococcus aureus (BCID) NOT DETECTED NOT DETECTED   Staphylococcus epidermidis NOT DETECTED NOT DETECTED   Staphylococcus lugdunensis NOT DETECTED NOT DETECTED   Streptococcus species DETECTED (A) NOT DETECTED   Streptococcus agalactiae NOT DETECTED NOT DETECTED   Streptococcus pneumoniae NOT DETECTED NOT DETECTED   Streptococcus pyogenes NOT DETECTED NOT DETECTED   A.calcoaceticus-baumannii NOT DETECTED NOT DETECTED   Bacteroides fragilis NOT DETECTED NOT DETECTED   Enterobacterales NOT DETECTED NOT DETECTED   Enterobacter cloacae complex NOT DETECTED NOT DETECTED   Escherichia coli NOT DETECTED NOT DETECTED   Klebsiella aerogenes NOT DETECTED NOT DETECTED   Klebsiella oxytoca NOT DETECTED NOT DETECTED   Klebsiella pneumoniae NOT DETECTED NOT DETECTED   Proteus species NOT DETECTED NOT DETECTED   Salmonella species NOT DETECTED NOT DETECTED   Serratia marcescens NOT DETECTED NOT DETECTED   Haemophilus influenzae NOT DETECTED NOT DETECTED   Neisseria meningitidis NOT DETECTED NOT DETECTED    Pseudomonas aeruginosa NOT DETECTED NOT DETECTED   Stenotrophomonas maltophilia NOT DETECTED NOT DETECTED   Candida albicans NOT DETECTED NOT DETECTED   Candida auris NOT DETECTED NOT DETECTED   Candida glabrata NOT DETECTED NOT DETECTED   Candida krusei NOT DETECTED NOT DETECTED   Candida parapsilosis NOT DETECTED NOT DETECTED   Candida tropicalis NOT DETECTED NOT DETECTED   Cryptococcus neoformans/gattii NOT DETECTED NOT DETECTED    Berta Minor 02/12/2020  7:34 PM

## 2020-02-12 NOTE — Progress Notes (Signed)
PHARMACY - PHYSICIAN COMMUNICATION CRITICAL VALUE ALERT - BLOOD CULTURE IDENTIFICATION (BCID)  Nicholas Schultz is an 74 y.o. male who presented to [facility] on 01/27/2020 with a chief complaint of sepsis , respiratory failure due to covid.   Assessment:  Strep species in 1 of 4 bottles.  (include suspected source if known) Most likely a contaminant.   Name of physician (or Provider) Contacted: Rachael Fee, NP   Current antibiotics: Vanc and zosyn   Changes to prescribed antibiotics recommended:  No, will continue pt on current abx.  Results for orders placed or performed during the hospital encounter of 02/16/2020  Blood Culture ID Panel (Reflexed) (Collected: 02/11/2020  2:24 PM)  Result Value Ref Range   Enterococcus faecalis NOT DETECTED NOT DETECTED   Enterococcus Faecium NOT DETECTED NOT DETECTED   Listeria monocytogenes NOT DETECTED NOT DETECTED   Staphylococcus species NOT DETECTED NOT DETECTED   Staphylococcus aureus (BCID) NOT DETECTED NOT DETECTED   Staphylococcus epidermidis NOT DETECTED NOT DETECTED   Staphylococcus lugdunensis NOT DETECTED NOT DETECTED   Streptococcus species DETECTED (A) NOT DETECTED   Streptococcus agalactiae NOT DETECTED NOT DETECTED   Streptococcus pneumoniae NOT DETECTED NOT DETECTED   Streptococcus pyogenes NOT DETECTED NOT DETECTED   A.calcoaceticus-baumannii NOT DETECTED NOT DETECTED   Bacteroides fragilis NOT DETECTED NOT DETECTED   Enterobacterales NOT DETECTED NOT DETECTED   Enterobacter cloacae complex NOT DETECTED NOT DETECTED   Escherichia coli NOT DETECTED NOT DETECTED   Klebsiella aerogenes NOT DETECTED NOT DETECTED   Klebsiella oxytoca NOT DETECTED NOT DETECTED   Klebsiella pneumoniae NOT DETECTED NOT DETECTED   Proteus species NOT DETECTED NOT DETECTED   Salmonella species NOT DETECTED NOT DETECTED   Serratia marcescens NOT DETECTED NOT DETECTED   Haemophilus influenzae NOT DETECTED NOT DETECTED   Neisseria meningitidis NOT DETECTED  NOT DETECTED   Pseudomonas aeruginosa NOT DETECTED NOT DETECTED   Stenotrophomonas maltophilia NOT DETECTED NOT DETECTED   Candida albicans NOT DETECTED NOT DETECTED   Candida auris NOT DETECTED NOT DETECTED   Candida glabrata NOT DETECTED NOT DETECTED   Candida krusei NOT DETECTED NOT DETECTED   Candida parapsilosis NOT DETECTED NOT DETECTED   Candida tropicalis NOT DETECTED NOT DETECTED   Cryptococcus neoformans/gattii NOT DETECTED NOT DETECTED    Nicholas Schultz 02/12/2020  6:24 AM

## 2020-02-12 NOTE — Progress Notes (Signed)
Patient had a 11 beat run of v-tach this shift, no complaints of pain, hospitalist notified, EKG performed and labs ordered, will continue to monitor.

## 2020-02-12 NOTE — Progress Notes (Signed)
Daily update given to wife

## 2020-02-12 NOTE — Progress Notes (Signed)
PROGRESS NOTE    Nicholas Schultz  OJJ:[id number] DOB: 11/11/1945 DOA: 02/12/2020 PCP: Patient, [name] Per   Brief Narrative:  Nicholas Schultz a 74 y.o.malewith medical history significant forcoronary artery disease, heart failure with reduced EF(last known LVEF 30 to 35%),history of ischemic cardiomyopathy, nicotine dependence who presents to the emergency room for evaluation of worsening shortness of breath. Patient was recently exposed to a family member who had COVID-19 infection and had a positive Covid PCR test about 10 days ago. He is unvaccinated.  His wife has been checking his pulse oximetry at home and today noted that his pulse oximetry had been about 86% which prompted patient's visit to the emergency room for further evaluation. He complains of a fever and chills as well as myalgias and weakness but denies having any cough. He has nausea but no vomiting. He denies having any chest pain, no abdominal pain, no dizziness, no lightheadedness, no urinary symptoms or changes in his bowel habits. Labs show sodium 139, potassium 3.9, chloride 103, bicarb 21, BUN 101, BUN 35, creatinine 1.35, calcium 8.4, alkaline phosphatase 129, ALT 35, AST 80, total protein 6.6, LDH1100,troponin38,ferritin3623, lactic acid4.0,procalcitonin 0.18,white count 12.1, hemoglobin 15.1, hematocrit 44.5, MCV 84.1, RDW 16.8, fibrin derivatives 7,5000 Chest x-ray reviewed by me shows bilateral interstitial opacities. Lead EKG shows sinus tachycardia, PVCs and left bundle branch block  Patient is a 74 year old male with a history of ischemic cardiomyopathy, chronic systolic heart failure with last known LVEF of 30 to 35% who presents to the emergency room via EMS for evaluation of hypoxia. Patient had a positive Covid test about 10 days ago and has been checking his pulse oximetry at home.Patient noted to have room air pulse oximetry of 86% prompting a visit to the emergency room. Patient has been  hypotensive with systolic blood pressure in the 80s but is awake and alert. He received 1 L IV fluid hydration in the ER with improvement in his blood pressure. He also received remdesivir and prednisone. He is currently on high flow oxygen and will be admitted to the hospital for further evaluation.  Assessment & Plan:   Principal Problem:   Acute respiratory failure due to COVID-19 [facility]) Active Problems:   Chronic systolic heart failure (HCC)   Lactic acidosis   Goals of care, counseling/discussion   Palliative care by specialist Acute hypoxic respiratory failure and severe sepsis secondary to COVID-19 pneumonia:  Patient meets severe sepsis criteria based on tachycardia, tachypnea, leukocytosis, hypoxia and lactic acid of 4.0.  Patient was requiring up to 13 L of high flow oxygen up until last night but this morning, he states that he was feeling better and currently he is on 6 L of high flow.  Significantly elevated D-dimer.  CT angiogram negative for PE.  Slight improvement in inflammatory markers otherwise.  Continue to wean oxygen.  Repeat lactic acid.  Continue following Remdesivir per pharmacy protocol IV Solu-Medrol Bronchodilator, antitussive.  Patient was encouraged to prone, out of bed to chair, to use incentive spirometry and flutter valve. Baricitinib  off label use -patient himself too confused to comprehend benefits and risks of this medication and thus he is not in a position to provide any consent.  Patient's wife was told that if COVID-19 pneumonitis gets worse we might potentially use Actemra/ barititinib off label, she told me that patient has no known history of active diverticulitis, tuberculosis or hepatitis, understands the risks and benefits and wants to proceed with Actemra/barititinib and thus I will start him  on baricitinib. The treatment plan and use of medications and known side effects were discussed with patient/family, they were clearly explained that there is no  proven definitive treatment for COVID-19 infection, any medications used here are based on published clinical articles/anecdotal data which are not peer-reviewed or randomized control trials.  Complete risks and long-term side effects are unknown, however in the best clinical judgment they seem to be of some clinical benefit rather than medical risks.  Patient/family agree with the treatment plan and want to receive the given medications. -9/22 As pt is still requiring high amount of oxygen and NR we will continue to provide him with supplemental  Oxygen, and steroids. Will start pt on empiric t/t of PE due to elevated fibrin level. We will also start pt on anticoagulation to prevent any vte's.  -9/23 Pt seen today and after starting him on Lovenox his hemoglobin dropped , but pt also had been started on ivf , we will assess if there is any further drops or active bleeding.  lovenox is d/c and pt is started on heparin q8h for dvt prophylaxis. With IVF pt has clinically improved.    Chronic systolic congestive heart failure:  Patient's LVEF is 30 to 35% per echo done previously.  He was hypotensive in the ED and received 1 L of IV fluid.  Lasix were held because of that.  Now his blood pressure is better.  We will give him 1 dose of Lasix 20 mg IV in effort to keep him dry and prevent fluid overload which is proved to be detrimental in COVID-19 patients.  Will reassess daily for further Lasix doses.  Continue aspirin and Brilinta.have requested cardiology consult for reduced ef chf and hypoperfusion. Appreciate cardiology managemtn.   Metabolic acidosis due to lactic acid level: ? If this is infectious or noninfectious etiology. We will treat empirically with iv abx and vancomycin and zosyn.   we will start pt on Lovenox and monitor.  ABG is pending.  Nephrology consult.   Anemia: Pt has stable hemoglobin.  We will cont iv ppi and type /screen / transfuse if needed. Iron studies show anemic of  chronic disease.    DVT prophylaxis: SCD;d/ heparin 5000 subcut q 8.  Code Status:  Full code. Family Communication: None at bedside. Disposition Plan: TBD.  Status is: Inpatient Dispo: The patient is from: Home              Anticipated d/c is to: SNF              Anticipated d/c date is: about 5 days.              Patient currently is not medically stable to d/c.   Consultants:  Cardiology; Dr. Clayborn Bigness.   Subjective: Pt is alert and answers questions but is sleepy. Vitals are stable but his lactic acid has been elevated and has remained elevated since yesterday. D/D include Sepsis/ Ischemia. Pt has been admitted for covid PNA and is on HFNC at 15L.Intermittenyl has been hypoxic , will dw/ Dr. Mortimer Fries if pt can be transferred to ICU for worsening covid-19 related respiratory failure.   9/23 Pt is alert awake and oriented and eating breakfast.  Reviewed cardiology note and will request nephrology consult.    Objective: Vitals:   02/12/20 0837 02/12/20 1029 02/12/20 1146 02/12/20 1205  BP:   (!) 147/125   Pulse: 97 63 (!) 57   Resp: 14 20 19    Temp:   [name])  97.5 F (36.4 C)   TempSrc:   Oral   SpO2: 90% 100% 94% 96%  Weight:      Height:        Intake/Output Summary (Last 24 hours) at 02/12/2020 1258 Last data filed at 02/12/2020 2409 Gross per 24 hour  Intake 2603.81 ml  Output 475 ml  Net 2128.81 ml   Filed Weights   01/24/2020 1348  Weight: 77.1 kg    Examination: Blood pressure (!) 147/125, pulse (!) 57, temperature (!) 97.5 F (36.4 C), temperature source Oral, resp. rate 19, height 5\' 11"  (1.803 m), weight 77.1 kg, SpO2 96 %. General exam: Appears calm and comfortable  Respiratory system: Clear to auscultation. Respiratory effort normal. Cardiovascular system:  RRR. No JVD, murmurs, rubs, gallops or clicks. No pedal edema. Gastrointestinal system: Abdomen is nondistended, soft and nontender. No organomegaly or masses felt. Normal bowel sounds heard. Central  nervous system: Alert and oriented. No focal neurological deficits. Extremities: pt moving all ext but slowly  Skin: No rashes, lesions or ulcers Psychiatry: Judgement and insight appear normal. Mood & affect appropriate.   Data Reviewed: I have personally reviewed following labs and imaging studies  I/O last 3 completed shifts: In: 1900.9 [P.O.:220; I.V.:811.5; IV Piggyback:869.4] Out: 975 [Urine:975] Total I/O In: 823 [P.O.:240; I.V.:476.2; IV Piggyback:106.8] Out: -  Lab Results  Component Value Date   CREATININE 1.07 02/12/2020   CREATININE 1.08 02/11/2020   CREATININE 0.98 02/11/2020   CBC: Recent Labs  Lab 01/28/2020 1347 02/10/20 0520 02/11/20 0654 02/12/20 0543  WBC 12.1* 10.7* 8.2 4.9  NEUTROABS 10.1* 8.8* 6.9 4.1  HGB 15.1 14.9 15.7 12.5*  HCT 44.5 44.0 47.8 36.5*  MCV 84.1 83.5 86.0 83.1  PLT 172 155 135* 735*   Basic Metabolic Panel: Recent Labs  Lab 01/22/2020 1347 02/10/20 0520 02/11/20 0654 02/11/20 2332 02/12/20 0543  NA 139 143 139 139 140  K 3.9 5.4* 3.4* 3.7 4.0  CL 103 107 105 106 106  CO2 21* 24 19* 21* 25  GLUCOSE 101* 127* 107* 134* 175*  BUN 35* 35* 37* 35* 35*  CREATININE 1.35* 1.14 0.98 1.08 1.07  CALCIUM 8.4* 8.9 8.6* 8.1* 8.1*  MG  --  2.8* 2.4 2.4 2.6*  PHOS  --  4.6 3.4  --  3.6   GFR: Estimated Creatinine Clearance: 63.5 mL/min (by C-G formula based on SCr of 1.07 mg/dL). Liver Function Tests: Recent Labs  Lab 02/10/2020 1347 02/10/20 0520 02/11/20 0654 02/12/20 0543  AST 80* 67* 54* 56*  ALT 35 35 37 38  ALKPHOS 129* 120 116 104  BILITOT 2.2* 2.0* 2.4* 2.7*  PROT 6.6 6.6 6.4* 5.6*  ALBUMIN 2.9* 2.9* 2.7* 2.3*   Lipid Profile: Recent Labs    02/13/2020 1347  TRIG 177*   Thyroid Function Tests: Recent Labs    02/12/20 0543  TSH 1.025  FREET4 1.02   Anemia Panel: Recent Labs    02/11/20 0654 02/12/20 0543  FOLATE  --  8.3  FERRITIN 1,461* 1,012*  TIBC  --  136*  IRON  --  28*  RETICCTPCT  --  1.1   Sepsis  Labs: Recent Labs  Lab 02/19/2020 1347 02/11/2020 1405 02/11/20 1224 02/11/20 1806 02/11/20 2332 02/12/20 0543  PROCALCITON 0.18  --   --   --   --   --   LATICACIDVEN  --    < > 4.6* 4.4* 3.9* 2.3*   < > = values in this interval not displayed.  Recent Results (from the past 240 hour(s))  SARS Coronavirus 2 by RT PCR (hospital order, performed in [facility] hospital lab) Nasopharyngeal Nasopharyngeal Swab     Status: Abnormal   Collection Time: 01/26/2020  2:05 PM   Specimen: Nasopharyngeal Swab  Result Value Ref Range Status   SARS Coronavirus 2 POSITIVE (A) NEGATIVE Final    Comment: RESULT CALLED TO, READ BACK BY AND VERIFIED WITH: AMY COWEN 02/03/2020 AT 1534 BY ACR (NOTE) SARS-CoV-2 target nucleic acids are DETECTED  SARS-CoV-2 RNA is generally detectable in upper respiratory specimens  during the acute phase of infection.  Positive results are indicative  of the presence of the identified virus, but do not rule out bacterial infection or co-infection with other pathogens not detected by the test.  Clinical correlation with patient history and  other diagnostic information is necessary to determine patient infection status.  The expected result is negative.  Fact Sheet for Patients:   StrictlyIdeas.no   Fact Sheet for Healthcare Providers:   BankingDealers.co.za    This test is not yet approved or cleared by the [place] FDA and  has been authorized for detection and/or diagnosis of SARS-CoV-2 by FDA under an Emergency Use Authorization (EUA).  This EUA will remain in effect (meaning this test  can be used) for the duration of  the COVID-19 declaration under Section 564(b)(1) of the Act, 21 U.S.C. section 360-bbb-3(b)(1), unless the authorization is terminated or revoked sooner.  Performed at [facility], [address] [place], [place] [address]   Blood Culture (routine x 2)     Status: None (Preliminary  result)   Collection Time: 01/21/2020  2:20 PM   Specimen: BLOOD  Result Value Ref Range Status   Specimen Description BLOOD BLOOD LEFT ARM  Final   Special Requests   Final    BOTTLES DRAWN AEROBIC AND ANAEROBIC Blood Culture adequate volume   Culture   Final    NO GROWTH 3 DAYS Performed at [facility], [address] [place], [place] [address]    Report Status PENDING  Incomplete  Blood Culture (routine x 2)     Status: None (Preliminary result)   Collection Time: 01/23/2020  2:28 PM   Specimen: BLOOD  Result Value Ref Range Status   Specimen Description BLOOD RIGHT ANTECUBITAL  Final   Special Requests   Final    BOTTLES DRAWN AEROBIC AND ANAEROBIC Blood Culture adequate volume   Culture   Final    NO GROWTH 3 DAYS Performed at [facility], [address] [place], [place] [address]    Report Status PENDING  Incomplete  Culture, blood (Routine X 2) w Reflex to ID Panel     Status: None (Preliminary result)   Collection Time: 02/11/20  2:19 PM   Specimen: BLOOD  Result Value Ref Range Status   Specimen Description BLOOD LEFT ANTECUBITAL  Final   Special Requests   Final    BOTTLES DRAWN AEROBIC AND ANAEROBIC Blood Culture adequate volume   Culture   Final    NO GROWTH < 24 HOURS Performed at [facility], [name] [place],  [address]    Report Status PENDING  Incomplete  Culture, blood (Routine X 2) w Reflex to ID Panel     Status: None (Preliminary result)   Collection Time: 02/11/20  2:24 PM   Specimen: BLOOD  Result Value Ref Range Status   Specimen Description BLOOD BLOOD LEFT HAND  Final  Special Requests   Final    BOTTLES DRAWN AEROBIC AND ANAEROBIC Blood Culture adequate volume   Culture  Setup Time   Final    GRAM POSITIVE COCCI AEROBIC BOTTLE ONLY Organism ID to follow CRITICAL RESULT CALLED TO, READ BACK BY AND VERIFIED WITH: JASON ROBBINS AT 8563 ON 02/12/2020 [place]. Performed at [facility], [address] [place], [place] [address]    Culture [facility] POSITIVE COCCI  Final   Report Status PENDING  Incomplete  Blood Culture ID Panel (Reflexed)     Status: Abnormal   Collection Time: 02/11/20  2:24 PM  Result Value Ref Range Status   Enterococcus faecalis NOT DETECTED NOT DETECTED Final   Enterococcus Faecium NOT DETECTED NOT DETECTED Final   Listeria monocytogenes NOT DETECTED NOT DETECTED Final   Staphylococcus species NOT DETECTED NOT DETECTED Final   Staphylococcus aureus (BCID) NOT DETECTED NOT DETECTED Final   Staphylococcus epidermidis NOT DETECTED NOT DETECTED Final   Staphylococcus lugdunensis NOT DETECTED NOT DETECTED Final   Streptococcus species DETECTED (A) NOT DETECTED Final    Comment: Not Enterococcus species, Streptococcus agalactiae, Streptococcus pyogenes, or Streptococcus pneumoniae. CRITICAL RESULT CALLED TO, READ BACK BY AND VERIFIED WITH: JASON ROBBINS AT 0607 ON 02/12/2020 [place].    Streptococcus agalactiae NOT DETECTED NOT DETECTED Final   Streptococcus pneumoniae NOT DETECTED NOT DETECTED Final   Streptococcus pyogenes NOT DETECTED NOT DETECTED Final   A.calcoaceticus-baumannii NOT DETECTED NOT DETECTED Final   Bacteroides fragilis NOT DETECTED NOT DETECTED Final   Enterobacterales NOT DETECTED NOT DETECTED Final   Enterobacter cloacae complex NOT DETECTED NOT DETECTED Final   Escherichia coli NOT DETECTED NOT DETECTED Final   Klebsiella aerogenes NOT DETECTED NOT DETECTED Final   Klebsiella oxytoca NOT DETECTED NOT DETECTED Final   Klebsiella pneumoniae NOT DETECTED NOT DETECTED Final   Proteus species NOT DETECTED NOT DETECTED Final   Salmonella species NOT DETECTED NOT DETECTED Final   Serratia marcescens NOT DETECTED NOT DETECTED Final   Haemophilus influenzae NOT DETECTED NOT DETECTED Final   Neisseria meningitidis NOT DETECTED NOT DETECTED Final   Pseudomonas aeruginosa NOT DETECTED NOT DETECTED Final   Stenotrophomonas maltophilia NOT DETECTED  NOT DETECTED Final   Candida albicans NOT DETECTED NOT DETECTED Final   Candida auris NOT DETECTED NOT DETECTED Final   Candida glabrata NOT DETECTED NOT DETECTED Final   Candida krusei NOT DETECTED NOT DETECTED Final   Candida parapsilosis NOT DETECTED NOT DETECTED Final   Candida tropicalis NOT DETECTED NOT DETECTED Final   Cryptococcus neoformans/gattii NOT DETECTED NOT DETECTED Final    Comment: Performed at [facility], [address] [place], [place] [address]     Radiology Studies: CT ANGIO CHEST PE W OR WO CONTRAST  Result Date: 02/12/2020 CLINICAL DATA:  Respiratory failure. Recent exposure to COVID-19 with subsequent positive COVID PCR test 10 days ago. History of heart failure secondary to ischemic cardiomyopathy. EXAM: CT ANGIOGRAPHY CHEST WITH CONTRAST TECHNIQUE: Multidetector CT imaging of the chest was performed using the standard protocol during bolus administration of intravenous contrast. Multiplanar CT image reconstructions and MIPs were obtained to evaluate the vascular anatomy. CONTRAST:  18mL OMNIPAQUE IOHEXOL 350 MG/ML SOLN COMPARISON:  02/08/2020 FINDINGS: Cardiovascular: The main pulmonary artery is patent. No central obstructing pulmonary emboli. There are no lobar or segmental pulmonary artery filling defects identified. Cardiac enlargement. Aortic atherosclerosis. Extensive coronary artery atherosclerotic calcifications. No pericardial effusion. Mediastinum/Nodes: Insert thyroid The trachea appears patent and is midline. Normal appearance of the  esophagus. Mediastinal enlarged subcarinal lymph node measures 1.5 cm, image 48/4. Prominent right paratracheal and prevascular lymph nodes identified. Bilateral hilar adenopathy is noted including 1.7 cm right hilar lymph node, image 45/4. Left hilar lymph node measures 1.2 cm, image 46/4. Lungs/Pleura: Centrilobular and paraseptal emphysema identified. There is bilateral lower lung zone predominant interlobular septal  thickening. Diffuse ground-glass opacities are also noted throughout both lungs, most severe within the lower lung zones. No lobar consolidation, atelectasis or pneumothorax. Upper Abdomen: No acute abnormality within the imaged portions of the upper abdomen. Aortic atherosclerosis noted. Musculoskeletal: Mild spondylosis within the thoracic spine. No acute or suspicious findings. Review of the MIP images confirms the above findings. IMPRESSION: 1. No evidence for acute pulmonary embolus. 2. Diffuse interlobular septal thickening and ground-glass opacification throughout both lungs compatible with COVID-19 pneumonia. 3. Enlarged mediastinal and bilateral hilar lymph nodes. Similar. Favor reactive adenopathy. 4. Extensive coronary artery atherosclerotic calcifications noted. Aortic Atherosclerosis (ICD10-I70.0) and Emphysema (ICD10-J43.9). Electronically Signed   By: Kerby Moors M.D.   On: 02/12/2020 11:10   DG Chest Port 1 View  Result Date: 02/11/2020 CLINICAL DATA:  Respiratory failure.  COVID-19 infection. EXAM: PORTABLE CHEST 1 VIEW COMPARISON:  CT chest and chest x-ray dated February 09, 2020. FINDINGS: Stable cardiomediastinal silhouette. Patchy bilateral interstitial and hazy airspace opacities have mildly progressed since the prior study. No pleural effusion or pneumothorax. No acute osseous abnormality. IMPRESSION: 1. Mild progression of multifocal pneumonia. Electronically Signed   By: Titus Dubin M.D.   On: 02/11/2020 16:27   Scheduled Meds: . albuterol  2 puff Inhalation Q6H  . vitamin C  500 mg Oral Daily  . aspirin EC  81 mg Oral Daily  . atorvastatin  80 mg Oral Daily  . baricitinib  4 mg Oral Daily  . Chlorhexidine Gluconate Cloth  6 each Topical Q0600  . heparin injection (subcutaneous)  5,000 Units Subcutaneous Q8H  . methylPREDNISolone (SOLU-MEDROL) injection  0.5 mg/kg Intravenous Q12H   Followed by  . [START ON 02/13/2020] predniSONE  50 mg Oral Daily  . sodium chloride  flush  3 mL Intravenous Q12H  . ticagrelor  90 mg Oral BID  . zinc sulfate  220 mg Oral Daily   Continuous Infusions: . sodium chloride 100 mL/hr at 02/12/20 0918  . piperacillin-tazobactam (ZOSYN)  IV 12.5 mL/hr at 02/12/20 0918  . remdesivir 100 mg in NS 100 mL Stopped (02/12/20 0857)  . vancomycin Stopped (02/12/20 0304)     LOS: 3 days    Para Skeans, MD [facility] Pager [phone] If 7PM-7AM, please contact night-coverage [email or web address] Password [facility] 02/12/2020, 12:58 PM

## 2020-02-12 NOTE — Consult Note (Signed)
[facility]  CONSULT NOTE    Date: 02/12/2020                  Patient Name:  Alexis Mizuno  MRN: [id number]  DOB: 1945-09-29  Age / Sex: 74 y.o., male         PCP: Patient, [name] Per                 Service Requesting Consult: Dr. Posey Pronto                 Reason for Consult: Metabolic acidosis            History of Present Illness: Mr. Ethanjames Fontenot admitted to [facility] with COVID-19 infection. Patient also found to have hypotension and strep bacteremia. He received IV contrast on 9/20 and 9/23.  Nephrology consulted for lactic acidosis.    Medications: Outpatient medications: Medications Prior to Admission  Medication Sig Dispense Refill Last Dose  . aspirin EC 81 MG tablet Take 1 tablet (81 mg total) by mouth daily. 30 tablet 11 Past Week at Unknown time  . atorvastatin (LIPITOR) 80 MG tablet TAKE 1 TABLET BY MOUTH EVERY DAY AT 6 PM 30 tablet 6 Past Week at Unknown time  . flavoxATE (URISPAS) 100 MG tablet Take 100 mg by mouth as needed (urinary frequency).   prn at prn  . furosemide (LASIX) 40 MG tablet Take 1 tablet (40 mg) by mouth once a day for 2-3 days until weight down 5 lbs. Then take 1 tablet (40 mg) by mouth daily as needed for swelling. 30 tablet 5 prn at prn  . nitroGLYCERIN (NITROSTAT) 0.4 MG SL tablet Place 1 tablet (0.4 mg total) under the tongue every 5 (five) minutes x 3 doses as needed for chest pain. 25 tablet 2 prn at prn  . ticagrelor (BRILINTA) 90 MG TABS tablet Take 1 tablet (90 mg total) by mouth 2 (two) times daily. 180 tablet 2 Past Week at Unknown time    Current medications: Current Facility-Administered Medications  Medication Dose Route Frequency Provider Last Rate Last Admin  . 0.9 %  sodium chloride infusion   Intravenous Continuous Para Skeans, MD 100 mL/hr at 02/12/20 0918 Rate Verify at 02/12/20 0918  . acetaminophen (TYLENOL) tablet 650 mg  650 mg Oral Q6H PRN Agbata, Tochukwu, MD      . albuterol (VENTOLIN HFA) 108 (90  Base) MCG/ACT inhaler 2 puff  2 puff Inhalation Q6H Agbata, Tochukwu, MD   2 puff at 02/12/20 0802  . ascorbic acid (VITAMIN C) tablet 500 mg  500 mg Oral Daily Agbata, Tochukwu, MD   500 mg at 02/12/20 1324  . aspirin EC tablet 81 mg  81 mg Oral Daily Agbata, Tochukwu, MD   81 mg at 02/12/20 0803  . atorvastatin (LIPITOR) tablet 80 mg  80 mg Oral Daily Agbata, Tochukwu, MD   80 mg at 02/12/20 0807  . baricitinib (OLUMIANT) tablet 4 mg  4 mg Oral Daily Darliss Cheney, MD   4 mg at 02/12/20 0804  . Chlorhexidine Gluconate Cloth 2 % PADS 6 each  6 each Topical Q0600 Para Skeans, MD   6 each at 02/12/20 0530  . guaiFENesin-dextromethorphan (ROBITUSSIN DM) 100-10 MG/5ML syrup 10 mL  10 mL Oral Q4H PRN Agbata, Tochukwu, MD      . heparin injection 5,000 Units  5,000 Units Subcutaneous Q8H Patel, Ekta V, MD      . methylPREDNISolone sodium succinate (SOLU-MEDROL) 40  mg/mL injection 38.4 mg  0.5 mg/kg Intravenous Q12H Agbata, Tochukwu, MD   38.4 mg at 02/12/20 0804   Followed by  . [START ON 02/13/2020] predniSONE (DELTASONE) tablet 50 mg  50 mg Oral Daily Agbata, Tochukwu, MD      . nitroGLYCERIN (NITROSTAT) SL tablet 0.4 mg  0.4 mg Sublingual Q5 Min x 3 PRN Agbata, Tochukwu, MD      . ondansetron (ZOFRAN) tablet 4 mg  4 mg Oral Q6H PRN Agbata, Tochukwu, MD       Or  . ondansetron (ZOFRAN) injection 4 mg  4 mg Intravenous Q6H PRN Agbata, Tochukwu, MD      . piperacillin-tazobactam (ZOSYN) IVPB 3.375 g  3.375 g Intravenous Q8H Florina Ou V, MD 12.5 mL/hr at 02/12/20 0918 Rate Verify at 02/12/20 0918  . remdesivir 100 mg in sodium chloride 0.9 % 100 mL IVPB  100 mg Intravenous Daily Collier Bullock, MD   Stopped at 02/12/20 0857  . sodium chloride flush (NS) 0.9 % injection 3 mL  3 mL Intravenous Q12H Agbata, Tochukwu, MD   3 mL at 02/12/20 0808  . sodium chloride flush (NS) 0.9 % injection 3 mL  3 mL Intravenous PRN Agbata, Tochukwu, MD      . ticagrelor (BRILINTA) tablet 90 mg  90 mg Oral BID Agbata,  Tochukwu, MD   90 mg at 02/12/20 0804  . vancomycin (VANCOCIN) IVPB 1000 mg/200 mL premix  1,000 mg Intravenous Q12H Lu Duffel, [facility]   Stopped at 02/12/20 0304  . zinc sulfate capsule 220 mg  220 mg Oral Daily Agbata, Tochukwu, MD   220 mg at 02/12/20 0808      Allergies: No Known Allergies    Past Medical History: Past Medical History:  Diagnosis Date  . CAD (coronary artery disease)    a. s/p prior MI and stenting x 4 in [place], [place]; b. 06/2018 Ant STEMI/PCI: LM mod dzs, LAD 50ost, 99p (2.75x16 Synergy DES), 30p/m ISR, LCX nl, OM1 90, OM2 mod dzs, RCA large, 70m ISR, RPDA 70ost, RPAV 50.  [name] HFrEF (heart failure with reduced ejection fraction) ([place])    a. 06/2018 Echo: EF 30-35%.  . Ischemic cardiomyopathy    a. 06/2018 Echo: EF 30-35%, apical septal, mid and apical lateral, apical, apical inf, mid and apical ant, and mid antsept AK w/ inflat HK.   . Tobacco abuse    a. 1 cigar/day. Prev smoked 3ppd cigarettes.     Past Surgical History: Past Surgical History:  Procedure Laterality Date  . CORONARY ANGIOPLASTY WITH STENT PLACEMENT    . CORONARY/GRAFT ACUTE MI REVASCULARIZATION N/A 07/16/2018   Procedure: Coronary/Graft Acute MI Revascularization;  Surgeon: Nelva Bush, MD;  Location: [place] [facility];  Service: Cardiovascular;  Laterality: N/A;  . CORONARY/GRAFT ACUTE MI REVASCULARIZATION N/A 03/18/2019   Procedure: Coronary/Graft Acute MI Revascularization;  Surgeon: Nelva Bush, MD;  Location: [place] [facility];  Service: Cardiovascular;  Laterality: N/A;  . IABP INSERTION N/A 07/16/2018   Procedure: IABP Insertion;  Surgeon: Nelva Bush, MD;  Location: [place] [facility];  Service: Cardiovascular;  Laterality: N/A;  . LEFT HEART CATH AND CORONARY ANGIOGRAPHY N/A 07/16/2018   Procedure: LEFT HEART CATH AND CORONARY ANGIOGRAPHY;  Surgeon: Nelva Bush, MD;  Location: [place] [facility];  Service: Cardiovascular;  Laterality: N/A;  . LEFT  HEART CATH AND CORONARY ANGIOGRAPHY N/A 03/18/2019   Procedure: LEFT HEART CATH AND CORONARY ANGIOGRAPHY;  Surgeon: Nelva Bush, MD;  Location: [place]  [facility];  Service: Cardiovascular;  Laterality: N/A;     Family History: Family History  Problem Relation Age of Onset  . Cancer Mother   . Heart disease Father      Social History: Social History   Socioeconomic History  . Marital status: Married    Spouse name: Not on file  . Number of children: Not on file  . Years of education: Not on file  . Highest education level: Not on file  Occupational History  . Not on file  Tobacco Use  . Smoking status: Current Every Day Smoker    Types: Cigars  . Smokeless tobacco: Never Used  . Tobacco comment: 1 per day  Vaping Use  . Vaping Use: Never used  Substance and Sexual Activity  . Alcohol use: Yes    Comment: rare  . Drug use: Never  . Sexual activity: Not on file  Other Topics Concern  . Not on file  Social History Narrative  . Not on file   Social Determinants of Health   Financial Resource Strain:   . Difficulty of Paying Living Expenses: Not on file  Food Insecurity:   . Worried About Charity fundraiser in the Last Year: Not on file  . Ran Out of Food in the Last Year: Not on file  Transportation Needs:   . Lack of Transportation (Medical): Not on file  . Lack of Transportation (Non-Medical): Not on file  Physical Activity:   . Days of Exercise per Week: Not on file  . Minutes of Exercise per Session: Not on file  Stress:   . Feeling of Stress : Not on file  Social Connections:   . Frequency of Communication with Friends and Family: Not on file  . Frequency of Social Gatherings with Friends and Family: Not on file  . Attends Religious Services: Not on file  . Active Member of Clubs or Organizations: Not on file  . Attends Archivist Meetings: Not on file  . Marital Status: Not on file  Intimate Partner Violence:   . Fear of Current or  Ex-Partner: Not on file  . Emotionally Abused: Not on file  . Physically Abused: Not on file  . Sexually Abused: Not on file     Review of Systems: Review of Systems  Constitutional: Negative.   HENT: Negative.   Eyes: Negative.   Respiratory: Positive for cough, hemoptysis, sputum production, shortness of breath and wheezing.   Cardiovascular: Positive for orthopnea and PND. Negative for chest pain, palpitations, claudication and leg swelling.  Gastrointestinal: Negative.   Genitourinary: Positive for frequency. Negative for dysuria, flank pain, hematuria and urgency.  Musculoskeletal: Negative for back pain, falls, joint pain, myalgias and neck pain.  Skin: Negative.   Neurological: Negative.   Endo/Heme/Allergies: Negative.   Psychiatric/Behavioral: Negative.     Vital Signs: Blood pressure (!) 147/125, pulse (!) 57, temperature (!) 97.5 F (36.4 C), temperature source Oral, resp. rate 19, height 5\' 11"  (1.803 m), weight 77.1 kg, SpO2 96 %.  Weight trends: Filed Weights   02/07/2020 1348  Weight: 77.1 kg    Physical Exam: General: NAD  Head: Normocephalic, atraumatic. Moist oral mucosal membranes  Eyes: Anicteric, PERRL  Neck: Supple, trachea midline  Lungs:  Bilateral rhonchi, HFNC 6 Liters O2  Heart: Regular rate and rhythm  Abdomen:  Soft, nontender,   Extremities:  no peripheral edema.  Neurologic: Nonfocal, moving all four extremities  Skin: No lesions  Lab results: Basic Metabolic Panel: Recent Labs  Lab 02/10/20 0520 02/10/20 0520 02/11/20 0654 02/11/20 2332 02/12/20 0543  NA 143   < > 139 139 140  K 5.4*   < > 3.4* 3.7 4.0  CL 107   < > 105 106 106  CO2 24   < > 19* 21* 25  GLUCOSE 127*   < > 107* 134* 175*  BUN 35*   < > 37* 35* 35*  CREATININE 1.14   < > 0.98 1.08 1.07  CALCIUM 8.9   < > 8.6* 8.1* 8.1*  MG 2.8*  --  2.4 2.4 2.6*  PHOS 4.6  --  3.4  --  3.6   < > = values in this interval not displayed.    Liver Function  Tests: Recent Labs  Lab 02/10/20 0520 02/11/20 0654 02/12/20 0543  AST 67* 54* 56*  ALT 35 37 38  ALKPHOS 120 116 104  BILITOT 2.0* 2.4* 2.7*  PROT 6.6 6.4* 5.6*  ALBUMIN 2.9* 2.7* 2.3*   No results for input(s): LIPASE, AMYLASE in the last 168 hours. No results for input(s): AMMONIA in the last 168 hours.  CBC: Recent Labs  Lab 02/17/2020 1347 02/03/2020 1347 02/10/20 0520 02/11/20 0654 02/12/20 0543  WBC 12.1*   < > 10.7* 8.2 4.9  NEUTROABS 10.1*   < > 8.8* 6.9 4.1  HGB 15.1   < > 14.9 15.7 12.5*  HCT 44.5   < > 44.0 47.8 36.5*  MCV 84.1  --  83.5 86.0 83.1  PLT 172   < > 155 135* 122*   < > = values in this interval not displayed.    Cardiac Enzymes: No results for input(s): CKTOTAL, CKMB, CKMBINDEX, TROPONINI in the last 168 hours.  BNP: Invalid input(s): POCBNP  CBG: No results for input(s): GLUCAP in the last 168 hours.  Microbiology: Results for orders placed or performed during the hospital encounter of 02/13/2020  SARS Coronavirus 2 by RT PCR (hospital order, performed in [facility] hospital lab) Nasopharyngeal Nasopharyngeal Swab     Status: Abnormal   Collection Time: 02/17/2020  2:05 PM   Specimen: Nasopharyngeal Swab  Result Value Ref Range Status   SARS Coronavirus 2 POSITIVE (A) NEGATIVE Final    Comment: RESULT CALLED TO, READ BACK BY AND VERIFIED WITH: AMY COWEN 01/25/2020 AT 1534 BY ACR (NOTE) SARS-CoV-2 target nucleic acids are DETECTED  SARS-CoV-2 RNA is generally detectable in upper respiratory specimens  during the acute phase of infection.  Positive results are indicative  of the presence of the identified virus, but do not rule out bacterial infection or co-infection with other pathogens not detected by the test.  Clinical correlation with patient history and  other diagnostic information is necessary to determine patient infection status.  The expected result is negative.  Fact Sheet for Patients:    StrictlyIdeas.no   Fact Sheet for Healthcare Providers:   BankingDealers.co.za    This test is not yet approved or cleared by the [place] FDA and  has been authorized for detection and/or diagnosis of SARS-CoV-2 by FDA under an Emergency Use Authorization (EUA).  This EUA will remain in effect (meaning this test  can be used) for the duration of  the COVID-19 declaration under Section 564(b)(1) of the Act, 21 U.S.C. section 360-bbb-3(b)(1), unless the authorization is terminated or revoked sooner.  Performed at [facility], [address] [place], [place] [address]   Blood Culture (routine x 2)  Status: None (Preliminary result)   Collection Time: 02/01/2020  2:20 PM   Specimen: BLOOD  Result Value Ref Range Status   Specimen Description BLOOD BLOOD LEFT ARM  Final   Special Requests   Final    BOTTLES DRAWN AEROBIC AND ANAEROBIC Blood Culture adequate volume   Culture   Final    NO GROWTH 3 DAYS Performed at [facility], [address] [place], [place] [address]    Report Status PENDING  Incomplete  Blood Culture (routine x 2)     Status: None (Preliminary result)   Collection Time: 01/26/2020  2:28 PM   Specimen: BLOOD  Result Value Ref Range Status   Specimen Description BLOOD RIGHT ANTECUBITAL  Final   Special Requests   Final    BOTTLES DRAWN AEROBIC AND ANAEROBIC Blood Culture adequate volume   Culture   Final    NO GROWTH 3 DAYS Performed at [facility], [address] [place], [place] [address]    Report Status PENDING  Incomplete  Culture, blood (Routine X 2) w Reflex to ID Panel     Status: None (Preliminary result)   Collection Time: 02/11/20  2:19 PM   Specimen: BLOOD  Result Value Ref Range Status   Specimen Description BLOOD LEFT ANTECUBITAL  Final   Special Requests   Final    BOTTLES DRAWN AEROBIC AND ANAEROBIC Blood Culture adequate volume   Culture   Final    NO  GROWTH < 24 HOURS Performed at [facility], [address] [place], [place] [address]    Report Status PENDING  Incomplete  Culture, blood (Routine X 2) w Reflex to ID Panel     Status: None (Preliminary result)   Collection Time: 02/11/20  2:24 PM   Specimen: BLOOD  Result Value Ref Range Status   Specimen Description BLOOD BLOOD LEFT HAND  Final   Special Requests   Final    BOTTLES DRAWN AEROBIC AND ANAEROBIC Blood Culture adequate volume   Culture  Setup Time   Final    GRAM POSITIVE COCCI AEROBIC BOTTLE ONLY Organism ID to follow CRITICAL RESULT CALLED TO, READ BACK BY AND VERIFIED WITH: JASON ROBBINS AT 2694 ON 02/12/2020 [place]. Performed at [facility], [address] [place], [place] [address]    Culture [facility] POSITIVE COCCI  Final   Report Status PENDING  Incomplete  Blood Culture ID Panel (Reflexed)     Status: Abnormal   Collection Time: 02/11/20  2:24 PM  Result Value Ref Range Status   Enterococcus faecalis NOT DETECTED NOT DETECTED Final   Enterococcus Faecium NOT DETECTED NOT DETECTED Final   Listeria monocytogenes NOT DETECTED NOT DETECTED Final   Staphylococcus species NOT DETECTED NOT DETECTED Final   Staphylococcus aureus (BCID) NOT DETECTED NOT DETECTED Final   Staphylococcus epidermidis NOT DETECTED NOT DETECTED Final   Staphylococcus lugdunensis NOT DETECTED NOT DETECTED Final   Streptococcus species DETECTED (A) NOT DETECTED Final    Comment: Not Enterococcus species, Streptococcus agalactiae, Streptococcus pyogenes, or Streptococcus pneumoniae. CRITICAL RESULT CALLED TO, READ BACK BY AND VERIFIED WITH: JASON ROBBINS AT 0607 ON 02/12/2020 [place].    Streptococcus agalactiae NOT DETECTED NOT DETECTED Final   Streptococcus pneumoniae NOT DETECTED NOT DETECTED Final   Streptococcus pyogenes NOT DETECTED NOT DETECTED Final   A.calcoaceticus-baumannii NOT DETECTED NOT DETECTED Final   Bacteroides fragilis NOT DETECTED NOT DETECTED Final    Enterobacterales NOT DETECTED NOT DETECTED Final   Enterobacter cloacae complex NOT  DETECTED NOT DETECTED Final   Escherichia coli NOT DETECTED NOT DETECTED Final   Klebsiella aerogenes NOT DETECTED NOT DETECTED Final   Klebsiella oxytoca NOT DETECTED NOT DETECTED Final   Klebsiella pneumoniae NOT DETECTED NOT DETECTED Final   Proteus species NOT DETECTED NOT DETECTED Final   Salmonella species NOT DETECTED NOT DETECTED Final   Serratia marcescens NOT DETECTED NOT DETECTED Final   Haemophilus influenzae NOT DETECTED NOT DETECTED Final   Neisseria meningitidis NOT DETECTED NOT DETECTED Final   Pseudomonas aeruginosa NOT DETECTED NOT DETECTED Final   Stenotrophomonas maltophilia NOT DETECTED NOT DETECTED Final   Candida albicans NOT DETECTED NOT DETECTED Final   Candida auris NOT DETECTED NOT DETECTED Final   Candida glabrata NOT DETECTED NOT DETECTED Final   Candida krusei NOT DETECTED NOT DETECTED Final   Candida parapsilosis NOT DETECTED NOT DETECTED Final   Candida tropicalis NOT DETECTED NOT DETECTED Final   Cryptococcus neoformans/gattii NOT DETECTED NOT DETECTED Final    Comment: Performed at [facility], [address] [place], [place] [address]    Coagulation Studies: No results for input(s): LABPROT, INR in the last 72 hours.  Urinalysis: No results for input(s): COLORURINE, LABSPEC, PHURINE, GLUCOSEU, HGBUR, BILIRUBINUR, KETONESUR, PROTEINUR, UROBILINOGEN, NITRITE, LEUKOCYTESUR in the last 72 hours.  Invalid input(s): APPERANCEUR    Imaging: CT ANGIO CHEST PE W OR WO CONTRAST  Result Date: 02/12/2020 CLINICAL DATA:  Respiratory failure. Recent exposure to COVID-19 with subsequent positive COVID PCR test 10 days ago. History of heart failure secondary to ischemic cardiomyopathy. EXAM: CT ANGIOGRAPHY CHEST WITH CONTRAST TECHNIQUE: Multidetector CT imaging of the chest was performed using the standard protocol during bolus administration of intravenous contrast.  Multiplanar CT image reconstructions and MIPs were obtained to evaluate the vascular anatomy. CONTRAST:  61mL OMNIPAQUE IOHEXOL 350 MG/ML SOLN COMPARISON:  01/21/2020 FINDINGS: Cardiovascular: The main pulmonary artery is patent. No central obstructing pulmonary emboli. There are no lobar or segmental pulmonary artery filling defects identified. Cardiac enlargement. Aortic atherosclerosis. Extensive coronary artery atherosclerotic calcifications. No pericardial effusion. Mediastinum/Nodes: Insert thyroid The trachea appears patent and is midline. Normal appearance of the esophagus. Mediastinal enlarged subcarinal lymph node measures 1.5 cm, image 48/4. Prominent right paratracheal and prevascular lymph nodes identified. Bilateral hilar adenopathy is noted including 1.7 cm right hilar lymph node, image 45/4. Left hilar lymph node measures 1.2 cm, image 46/4. Lungs/Pleura: Centrilobular and paraseptal emphysema identified. There is bilateral lower lung zone predominant interlobular septal thickening. Diffuse ground-glass opacities are also noted throughout both lungs, most severe within the lower lung zones. No lobar consolidation, atelectasis or pneumothorax. Upper Abdomen: No acute abnormality within the imaged portions of the upper abdomen. Aortic atherosclerosis noted. Musculoskeletal: Mild spondylosis within the thoracic spine. No acute or suspicious findings. Review of the MIP images confirms the above findings. IMPRESSION: 1. No evidence for acute pulmonary embolus. 2. Diffuse interlobular septal thickening and ground-glass opacification throughout both lungs compatible with COVID-19 pneumonia. 3. Enlarged mediastinal and bilateral hilar lymph nodes. Similar. Favor reactive adenopathy. 4. Extensive coronary artery atherosclerotic calcifications noted. Aortic Atherosclerosis (ICD10-I70.0) and Emphysema (ICD10-J43.9). Electronically Signed   By: Kerby Moors M.D.   On: 02/12/2020 11:10   DG Chest Port 1  View  Result Date: 02/11/2020 CLINICAL DATA:  Respiratory failure.  COVID-19 infection. EXAM: PORTABLE CHEST 1 VIEW COMPARISON:  CT chest and chest x-ray dated February 09, 2020. FINDINGS: Stable cardiomediastinal silhouette. Patchy bilateral interstitial and hazy airspace opacities have mildly progressed since the prior study. No  pleural effusion or pneumothorax. No acute osseous abnormality. IMPRESSION: 1. Mild progression of multifocal pneumonia. Electronically Signed   By: Titus Dubin M.D.   On: 02/11/2020 16:27      Assessment & Plan: Mr. Achilles Neville is a 74 y.o.  male with hypertension, hyperlipidemia, congestive heart failure, coronary artery disease, who was admitted to [facility] on 02/15/2020 for Acute respiratory failure with hypoxia ([place]) [J96.01] Pneumonia due to COVID-19 virus [U07.1, J12.82]  1. Anion gap metabolic acidosis: lactic acidosis With hypotension since admission.  Lactic acidosis improving with IV fluids.  - Monitor volume status closely.  - IV fluids:     LOS: 3 Emrah Ariola 9/23/202112:51 PM

## 2020-02-12 NOTE — Progress Notes (Signed)
[facility] Cardiology    SUBJECTIVE: Patient continues to rest comfortably somnolent sleeping fatigue in Covid unit still short of breath   Vitals:   02/11/20 2350 02/12/20 0520 02/12/20 0725 02/12/20 0837  BP:  92/71 90/66   Pulse:  79 81 97  Resp:  16 19 14   Temp:  97.6 F (36.4 C) 97.7 F (36.5 C)   TempSrc:  Oral Oral   SpO2: 98% 98% 98% 90%  Weight:      Height:         Intake/Output Summary (Last 24 hours) at 02/12/2020 7619 Last data filed at 02/12/2020 5093 Gross per 24 hour  Intake 1780.86 ml  Output 475 ml  Net 1305.86 ml      PHYSICAL EXAM  General: Well developed, well nourished, in no acute distress HEENT:  Normocephalic and atramatic Neck:  No JVD.  Lungs: Clear bilaterally to auscultation and percussion. Heart: HRRR . Normal S1 and S2 without gallops or murmurs.  Abdomen: Bowel sounds are positive, abdomen soft and non-tender  Msk:  Back normal, normal gait. Normal strength and tone for age. Extremities: No clubbing, cyanosis or edema.   Neuro: Alert and oriented X 3. Psych:  Good affect, responds appropriately   LABS: Basic Metabolic Panel: Recent Labs    02/11/20 0654 02/11/20 0654 02/11/20 2332 02/12/20 0543  NA 139   < > 139 140  K 3.4*   < > 3.7 4.0  CL 105   < > 106 106  CO2 19*   < > 21* 25  GLUCOSE 107*   < > 134* 175*  BUN 37*   < > 35* 35*  CREATININE 0.98   < > 1.08 1.07  CALCIUM 8.6*   < > 8.1* 8.1*  MG 2.4   < > 2.4 2.6*  PHOS 3.4  --   --  3.6   < > = values in this interval not displayed.   Liver Function Tests: Recent Labs    02/11/20 0654 02/12/20 0543  AST 54* 56*  ALT 37 38  ALKPHOS 116 104  BILITOT 2.4* 2.7*  PROT 6.4* 5.6*  ALBUMIN 2.7* 2.3*   No results for input(s): LIPASE, AMYLASE in the last 72 hours. CBC: Recent Labs    02/11/20 0654 02/12/20 0543  WBC 8.2 4.9  NEUTROABS 6.9 4.1  HGB 15.7 12.5*  HCT 47.8 36.5*  MCV 86.0 83.1  PLT 135* 122*   Cardiac Enzymes: No results for input(s): CKTOTAL,  CKMB, CKMBINDEX, TROPONINI in the last 72 hours. BNP: Invalid input(s): POCBNP D-Dimer: No results for input(s): DDIMER in the last 72 hours. Hemoglobin A1C: No results for input(s): HGBA1C in the last 72 hours. Fasting Lipid Panel: Recent Labs    01/30/2020 1347  TRIG 177*   Thyroid Function Tests: No results for input(s): TSH, T4TOTAL, T3FREE, THYROIDAB in the last 72 hours.  Invalid input(s): FREET3 Anemia Panel: Recent Labs    02/12/20 0543  FERRITIN 1,012*  RETICCTPCT 1.1    DG Chest Port 1 View  Result Date: 02/11/2020 CLINICAL DATA:  Respiratory failure.  COVID-19 infection. EXAM: PORTABLE CHEST 1 VIEW COMPARISON:  CT chest and chest x-ray dated February 09, 2020. FINDINGS: Stable cardiomediastinal silhouette. Patchy bilateral interstitial and hazy airspace opacities have mildly progressed since the prior study. No pleural effusion or pneumothorax. No acute osseous abnormality. IMPRESSION: 1. Mild progression of multifocal pneumonia. Electronically Signed   By: Titus Dubin M.D.   On: 02/11/2020 16:27     Echo  pending  TELEMETRY: EKG suggests normal sinus rhythm rate reasonably controlled:  ASSESSMENT AND PLAN:  Principal Problem:   Acute respiratory failure due to COVID-19 [facility]) Active Problems:   Chronic systolic heart failure (HCC)   Lactic acidosis   Goals of care, counseling/discussion   Palliative care by specialist Acute renal insufficiency  Plan Continue aggressive supportive respiratory care for respiratory failure secondary to Covid Echocardiogram for assessment of left ventricular function with chronic systolic heart failure Maintain adequate hydration in the face of lactic acidosis Renal insufficiency should be treated with adequate hydration consider nephrology input Do not recommend any invasive procedures from a cardiac standpoint at this juncture   Yolonda Kida, MD 02/12/2020 9:06 AM

## 2020-02-12 NOTE — Progress Notes (Signed)
Initial Nutrition Assessment  DOCUMENTATION CODES:   Severe malnutrition in context of chronic illness  INTERVENTION:  Diet was liberalized to regular.  Provide Ensure Enlive po TID, each supplement provides 350 kcal and 20 grams of protein. Patient prefers chocolate.  Provide Magic cup TID with meals, each supplement provides 290 kcal and 9 grams of protein.  Provide MVI po daily.  Encouraged adequate intake of calories and protein at meals. Discussed importance of adequate intake and increased nutrient requirements from catabolic nature of [id number]-[id number].  NUTRITION DIAGNOSIS:   Severe Malnutrition related to chronic illness (CHF) as evidenced by severe fat depletion, moderate muscle depletion, severe muscle depletion.  GOAL:   Patient will meet greater than or equal to 90% of their needs  MONITOR:   PO intake, Supplement acceptance, Labs, Weight trends, I & O's, Skin  REASON FOR ASSESSMENT:   Malnutrition Screening Tool    ASSESSMENT:   74 year old male with PMHx of CAD, chronic systolic CHF (LVEF 38-18%) admitted with [id number]-[id number] PNA, metabolic acidosis, anemia.   Met with patient at bedside. He reports his appetite is unchanged from baseline. He reports he "eats when he wants." Patient unable to provide any specific details on typical intake. Patient reports he is eating here however per chart his PO intake is 0-10% of meals. He denies any difficulty with chewing or swallowing. Patient reports he does enjoy chocolate Ensure and also enjoys ice cream.  Patient reports his UBW is 135 lbs but per review of weight history in chart this is likely not accurate. He appears to usually weigh between 79-83 kg. Patient was 83.1 kg on 07/14/2019. RD obtained bed scale weight today of 72.2 kg (159.17 lbs). He has lost 10.9 kg (13.1% body weight) over the past 7 months, which is significant for time frame.  Medications reviewed and include: vitamin C 500 mg daily, Solu-Medrol 0.5 mg/kg Q12hrs  IV, zinc sulfate 220 mg daily, NS at 100 mL/hr, Zosyn, remdesivir, vancomycin.  Labs reviewed: BUN 35, Magnesium 2.6.  Discussed with RN. Patient has not been interested in eating. He did eat more at lunch today (had some pot roast and mashed potatoes). Patient has been drinking chocolate Ensure.  Discussed with MD via secure chat. Okay to liberalize diet to regular.  NUTRITION - FOCUSED PHYSICAL EXAM:    Most Recent Value  Orbital Region Severe depletion  Upper Arm Region Severe depletion  Thoracic and Lumbar Region Moderate depletion  Buccal Region Severe depletion  Temple Region Severe depletion  Clavicle Bone Region Severe depletion  Clavicle and Acromion Bone Region Moderate depletion  Scapular Bone Region Moderate depletion  Dorsal Hand Moderate depletion  Patellar Region Severe depletion  Anterior Thigh Region Severe depletion  Posterior Calf Region Severe depletion  Edema (RD Assessment) None  Hair Reviewed  Eyes Reviewed  Mouth Reviewed  Skin Reviewed  Nails Reviewed     Diet Order:   Diet Order            Diet regular Room service appropriate? Yes; Fluid consistency: Thin  Diet effective now                EDUCATION NEEDS:   No education needs have been identified at this time  Skin:  Skin Assessment: Skin Integrity Issues: (MASD to anus)  Last BM:  02/07/2020  Height:   Ht Readings from Last 1 Encounters:  02/02/2020 _0  (1.803 m)   Weight:   Wt Readings from Last 1 Encounters:  02/12/20 72.2  kg   Ideal Body Weight:  78.2 kg  BMI:  Body mass index is 22.2 kg/m.  Estimated Nutritional Needs:   Kcal:  1900-2100  Protein:  95-105 grams  Fluid:  1.8-2 L/day  Jacklynn Barnacle, MS, RD, LDN Pager number available on Amion

## 2020-02-12 NOTE — Progress Notes (Signed)
Palliative:  Chart reviewed and checked in with RN - slight improvement today, less oxygen, wife already updated by RN. PMT will continue to follow and reach out to family as needed. Please review note from 9/22 for [place] conversation with wife.   Juel Burrow, DNP, AGNP-C Palliative Medicine Team Team Phone # [phone]  Pager # [phone]  NO CHARGE

## 2020-02-13 ENCOUNTER — Inpatient Hospital Stay: Payer: Medicare HMO

## 2020-02-13 DIAGNOSIS — E43 Unspecified severe protein-calorie malnutrition: Secondary | ICD-10-CM | POA: Insufficient documentation

## 2020-02-13 LAB — BLOOD GAS, ARTERIAL
Acid-Base Excess: 0.9 mmol/L (ref 0.0–2.0)
Acid-base deficit: 2.2 mmol/L — ABNORMAL HIGH (ref 0.0–2.0)
Bicarbonate: 20.4 mmol/L (ref 20.0–28.0)
Bicarbonate: 22.9 mmol/L (ref 20.0–28.0)
FIO2: 0.8
FIO2: 100
O2 Saturation: 88.8 %
O2 Saturation: 98 %
Patient temperature: 37
Patient temperature: 37
pCO2 arterial: 28 mmHg — ABNORMAL LOW (ref 32.0–48.0)
pCO2 arterial: 28 mmHg — ABNORMAL LOW (ref 32.0–48.0)
pH, Arterial: 7.47 — ABNORMAL HIGH (ref 7.350–7.450)
pH, Arterial: 7.52 — ABNORMAL HIGH (ref 7.350–7.450)
pO2, Arterial: 52 mmHg — ABNORMAL LOW (ref 83.0–108.0)
pO2, Arterial: 93 mmHg (ref 83.0–108.0)

## 2020-02-13 LAB — CBC WITH DIFFERENTIAL/PLATELET
Abs Immature Granulocytes: 0.06 10*3/uL (ref 0.00–0.07)
Basophils Absolute: 0 10*3/uL (ref 0.0–0.1)
Basophils Relative: 0 %
Eosinophils Absolute: 0 10*3/uL (ref 0.0–0.5)
Eosinophils Relative: 0 %
HCT: 35.4 % — ABNORMAL LOW (ref 39.0–52.0)
Hemoglobin: 12.3 g/dL — ABNORMAL LOW (ref 13.0–17.0)
Immature Granulocytes: 1 %
Lymphocytes Relative: 5 %
Lymphs Abs: 0.5 10*3/uL — ABNORMAL LOW (ref 0.7–4.0)
MCH: 29.3 pg (ref 26.0–34.0)
MCHC: 34.7 g/dL (ref 30.0–36.0)
MCV: 84.3 fL (ref 80.0–100.0)
Monocytes Absolute: 0.4 10*3/uL (ref 0.1–1.0)
Monocytes Relative: 4 %
Neutro Abs: 8.8 10*3/uL — ABNORMAL HIGH (ref 1.7–7.7)
Neutrophils Relative %: 90 %
Platelets: 142 10*3/uL — ABNORMAL LOW (ref 150–400)
RBC: 4.2 MIL/uL — ABNORMAL LOW (ref 4.22–5.81)
RDW: 16.9 % — ABNORMAL HIGH (ref 11.5–15.5)
WBC: 9.8 10*3/uL (ref 4.0–10.5)
nRBC: 0 % (ref 0.0–0.2)

## 2020-02-13 LAB — PHOSPHORUS: Phosphorus: 2.7 mg/dL (ref 2.5–4.6)

## 2020-02-13 LAB — LACTIC ACID, PLASMA
Lactic Acid, Venous: 2.9 mmol/L (ref 0.5–1.9)
Lactic Acid, Venous: 3.2 mmol/L (ref 0.5–1.9)

## 2020-02-13 LAB — C-REACTIVE PROTEIN: CRP: 9.7 mg/dL — ABNORMAL HIGH (ref <1.0)

## 2020-02-13 LAB — FIBRIN DERIVATIVES D-DIMER (ARMC ONLY): Fibrin derivatives D-dimer (ARMC): 5679.57 ng/mL (FEU) — ABNORMAL HIGH (ref 0.00–499.00)

## 2020-02-13 LAB — MAGNESIUM: Magnesium: 2.6 mg/dL — ABNORMAL HIGH (ref 1.7–2.4)

## 2020-02-13 LAB — HEMOGLOBIN AND HEMATOCRIT, BLOOD
HCT: 40.7 % (ref 39.0–52.0)
HCT: 38.2 % — ABNORMAL LOW (ref 39.0–52.0)
Hemoglobin: 13.3 g/dL (ref 13.0–17.0)
Hemoglobin: 12.9 g/dL — ABNORMAL LOW (ref 13.0–17.0)

## 2020-02-13 LAB — COMPREHENSIVE METABOLIC PANEL
ALT: 40 U/L (ref 0–44)
AST: 83 U/L — ABNORMAL HIGH (ref 15–41)
Albumin: 2.3 g/dL — ABNORMAL LOW (ref 3.5–5.0)
Alkaline Phosphatase: 269 U/L — ABNORMAL HIGH (ref 38–126)
Anion gap: 11 (ref 5–15)
BUN: 36 mg/dL — ABNORMAL HIGH (ref 8–23)
CO2: 23 mmol/L (ref 22–32)
Calcium: 8.1 mg/dL — ABNORMAL LOW (ref 8.9–10.3)
Chloride: 108 mmol/L (ref 98–111)
Creatinine, Ser: 0.97 mg/dL (ref 0.61–1.24)
GFR calc Af Amer: >60 mL/min (ref >60)
GFR calc non Af Amer: >60 mL/min (ref >60)
Glucose, Bld: 164 mg/dL — ABNORMAL HIGH (ref 70–99)
Potassium: 4.2 mmol/L (ref 3.5–5.1)
Sodium: 142 mmol/L (ref 135–145)
Total Bilirubin: 2.5 mg/dL — ABNORMAL HIGH (ref 0.3–1.2)
Total Protein: 6 g/dL — ABNORMAL LOW (ref 6.5–8.1)

## 2020-02-13 LAB — LACTATE DEHYDROGENASE: LDH: 1110 U/L — ABNORMAL HIGH (ref 98–192)

## 2020-02-13 LAB — FERRITIN: Ferritin: 1376 ng/mL — ABNORMAL HIGH (ref 24–336)

## 2020-02-13 MED ORDER — SODIUM CHLORIDE 0.9 % IV SOLN
3.0000 g | Freq: Four times a day (QID) | INTRAVENOUS | Status: DC
  Administered 2020-02-13 – 2020-02-16 (×12): 3 g via INTRAVENOUS
  Filled 2020-02-13 (×5): qty 8
  Filled 2020-02-13 (×4): qty 3
  Filled 2020-02-13: qty 8
  Filled 2020-02-13 (×2): qty 3
  Filled 2020-02-13 (×2): qty 8
  Filled 2020-02-13: qty 3
  Filled 2020-02-13: qty 8

## 2020-02-13 MED ORDER — FUROSEMIDE 10 MG/ML IJ SOLN
20.0000 mg | Freq: Two times a day (BID) | INTRAMUSCULAR | Status: AC
  Administered 2020-02-13 – 2020-02-14 (×2): 20 mg via INTRAVENOUS
  Filled 2020-02-13 (×2): qty 2

## 2020-02-13 MED ORDER — MIDODRINE HCL 5 MG PO TABS
2.5000 mg | ORAL_TABLET | Freq: Three times a day (TID) | ORAL | Status: DC
  Administered 2020-02-13 – 2020-02-21 (×24): 2.5 mg via ORAL
  Filled 2020-02-13 (×26): qty 1

## 2020-02-13 NOTE — Progress Notes (Signed)
Constantly removing NRB/HFNC o2 drops to 70s, reminding pt importance of keeping o2 on, states understanding but states that he doesn't like it on his face, placed mask back on pt

## 2020-02-13 NOTE — Progress Notes (Signed)
Pt with sitter at beside, sitter helping pt keep o2 in place, o2 sats improved with sitter at bedside

## 2020-02-13 NOTE — Progress Notes (Signed)
PROGRESS NOTE    Nicholas Schultz  AJO:[id number] DOB: 12-Jul-1945 DOA: 02/15/2020 PCP: Patient, [name] Per   Brief Narrative:  Nicholas Schultz a 74 y.o.malewith medical history significant forcoronary artery disease, heart failure with reduced EF(last known LVEF 30 to 35%),history of ischemic cardiomyopathy, nicotine dependence who presents to the emergency room for evaluation of worsening shortness of breath. Patient was recently exposed to a family member who had COVID-19 infection and had a positive Covid PCR test about 10 days ago. He is unvaccinated.  His wife has been checking his pulse oximetry at home and today noted that his pulse oximetry had been about 86% which prompted patient's visit to the emergency room for further evaluation. He complains of a fever and chills as well as myalgias and weakness but denies having any cough. He has nausea but no vomiting. He denies having any chest pain, no abdominal pain, no dizziness, no lightheadedness, no urinary symptoms or changes in his bowel habits. Labs show sodium 139, potassium 3.9, chloride 103, bicarb 21, BUN 101, BUN 35, creatinine 1.35, calcium 8.4, alkaline phosphatase 129, ALT 35, AST 80, total protein 6.6, LDH1100,troponin38,ferritin3623, lactic acid4.0,procalcitonin 0.18,white count 12.1, hemoglobin 15.1, hematocrit 44.5, MCV 84.1, RDW 16.8, fibrin derivatives 7,5000 Chest x-ray reviewed by me shows bilateral interstitial opacities. Lead EKG shows sinus tachycardia, PVCs and left bundle branch block  Patient is a 74 year old male with a history of ischemic cardiomyopathy, chronic systolic heart failure with last known LVEF of 30 to 35% who presents to the emergency room via EMS for evaluation of hypoxia. Patient had a positive Covid test about 10 days ago and has been checking his pulse oximetry at home.Patient noted to have room air pulse oximetry of 86% prompting a visit to the emergency room. Patient has been  hypotensive with systolic blood pressure in the 80s but is awake and alert. He received 1 L IV fluid hydration in the ER with improvement in his blood pressure. He also received remdesivir and prednisone. He is currently on high flow oxygen and will be admitted to the hospital for further evaluation.  Assessment & Plan:   Principal Problem:   Acute respiratory failure due to COVID-19 [facility]) Active Problems:   Chronic systolic heart failure (HCC)   Lactic acidosis   Goals of care, counseling/discussion   Palliative care by specialist   Protein-calorie malnutrition, severe Acute hypoxic respiratory failure and severe sepsis secondary to COVID-19 pneumonia:  Patient meets severe sepsis criteria based on tachycardia, tachypnea, leukocytosis, hypoxia and lactic acid of 4.0.  Patient was requiring up to 13 L of high flow oxygen up until last night but this morning, he states that he was feeling better and currently he is on 6 L of high flow.  Significantly elevated D-dimer.  CT angiogram negative for PE.  Slight improvement in inflammatory markers otherwise.  Continue to wean oxygen.  Repeat lactic acid.  Continue following Remdesivir per pharmacy protocol IV Solu-Medrol Bronchodilator, antitussive.  Patient was encouraged to prone, out of bed to chair, to use incentive spirometry and flutter valve. Baricitinib  off label use -patient himself too confused to comprehend benefits and risks of this medication and thus he is not in a position to provide any consent.  Patient's wife was told that if COVID-19 pneumonitis gets worse we might potentially use Actemra/ barititinib off label, she told me that patient has no known history of active diverticulitis, tuberculosis or hepatitis, understands the risks and benefits and wants to proceed with Actemra/barititinib and  thus I will start him on baricitinib. The treatment plan and use of medications and known side effects were discussed with patient/family, they  were clearly explained that there is no proven definitive treatment for COVID-19 infection, any medications used here are based on published clinical articles/anecdotal data which are not peer-reviewed or randomized control trials.  Complete risks and long-term side effects are unknown, however in the best clinical judgment they seem to be of some clinical benefit rather than medical risks.  Patient/family agree with the treatment plan and want to receive the given medications. -9/22 As pt is still requiring high amount of oxygen and NR we will continue to provide him with supplemental  Oxygen, and steroids. Will start pt on empiric t/t of PE due to elevated fibrin level. We will also start pt on anticoagulation to prevent any vte's.  -9/23 Pt seen today and after starting him on Lovenox his hemoglobin dropped , but pt also had been started on ivf , we will assess if there is any further drops or active bleeding.  lovenox is d/c and pt is started on heparin q8h for dvt prophylaxis. With IVF pt has clinically improved.  -9/24 Pt is doing poorly today have contacted wife and explained to her about volume management and difficulty with diuresis and low bp and hypotension and lactic acidosis. Cardiology consulted [place] following patient . We will give lasix and move pt to PCU.  Chronic systolic congestive heart failure:  Patient's LVEF is 30 to 35% per echo done previously.  He was hypotensive in the ED and received 1 L of IV fluid.  Lasix were held because of that.  Now his blood pressure is better.  We will give him 1 dose of Lasix 20 mg IV in effort to keep him dry and prevent fluid overload which is proved to be detrimental in COVID-19 patients.  Will reassess daily for further Lasix doses.  Continue aspirin and Brilinta.have requested cardiology consult for reduced ef chf and hypoperfusion. Appreciate cardiology managemtn. -lasix 2 mg iv x 2 doses. -Enalapril / or aldactone low dose once Hypotension  is resolved.    Metabolic acidosis due to lactic acid level: ? If this is infectious or noninfectious etiology. We will treat empirically with iv abx and vancomycin and zosyn.   we will start pt on Lovenox and monitor.  ABG is pending.  Nephrology consult.  attribute to perfusion issue with decreased ef CHF. abx d/c.[name]  Anemia: Pt has stable hemoglobin.  We will cont iv ppi and type /screen / transfuse if needed. Iron studies show anemic of chronic disease. Hb is stable.   DVT prophylaxis: SCD;d/ heparin 5000 subcut q 8.  Code Status:  Full code. Family Communication: None at bedside. Disposition Plan: TBD.  Status is: Inpatient Dispo: The patient is from: Home              Anticipated d/c is to: SNF              Anticipated d/c date is: about 5 days.              Patient currently is not medically stable to d/c.   Consultants:  Cardiology; Dr. Clayborn Bigness.   Subjective: Pt is alert and answers questions but is sleepy. Vitals are stable but his lactic acid has been elevated and has remained elevated since yesterday. D/D include Sepsis/ Ischemia. Pt has been admitted for covid PNA and is on HFNC at 15L.Intermittenyl has been hypoxic , will  dw/ Dr. Mortimer Fries if pt can be transferred to ICU for worsening covid-19 related respiratory failure.   9/23 Pt is alert awake and oriented and eating breakfast.  Reviewed cardiology note and will request nephrology consult.   9/24 pt is alert and oriented and denies any complaints.  Pt has been hypotensive and in CHF. 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   Objective: Vitals:   02/13/20 1221 02/13/20 1300 02/13/20 1334 02/13/20 1339  BP: 102/64     Pulse: 87     Resp: (!) 22   18  Temp:      TempSrc:      SpO2: 96% (!) 79% 91%   Weight:      Height:        Intake/Output Summary (Last 24 hours) at 02/13/2020 1507 Last data filed at 02/13/2020 1200 Gross per 24 hour  Intake  2732.36 ml  Output 725 ml  Net 2007.36 ml   Filed Weights   01/25/2020 1348 02/12/20 1731  Weight: 77.1 kg 72.2 kg    Examination: Blood pressure 102/64, pulse 87, temperature (!) 97.1 F (36.2 C), resp. rate 18, height 5\' 11"  (1.803 m), weight 72.2 kg, SpO2 91 %. General exam: Appears calm and comfortable  Respiratory system: Clear to auscultation. Respiratory effort normal. Cardiovascular system:  RRR. No JVD, murmurs, rubs, gallops or clicks. No pedal edema. Gastrointestinal system: Abdomen is nondistended, soft and nontender. No organomegaly or masses felt. Normal bowel sounds heard. Central nervous system: Alert and oriented. No focal neurological deficits. Extremities: pt moving all ext but slowly  Skin: No rashes, lesions or ulcers Psychiatry: Judgement and insight appear normal. Mood & affect appropriate.   Data Reviewed: I have personally reviewed following labs and imaging studies  I/O last 3 completed shifts: In: 3990.3 [P.O.:480; I.V.:2655.5; IV Piggyback:854.8] Out: 1250 [Urine:1250] Total I/O In: 477.8 [P.O.:240; I.V.:210.8; IV Piggyback:27.1] Out: -  Lab Results  Component Value Date   CREATININE 0.97 02/13/2020   CREATININE 1.07 02/12/2020   CREATININE 1.08 02/11/2020   CBC: Recent Labs  Lab 02/08/2020 1347 02/02/2020 1347 02/10/20 0520 02/10/20 0520 02/11/20 0654 02/11/20 0654 02/12/20 0543 02/12/20 1323 02/12/20 2243 02/13/20 0506 02/13/20 1303  WBC 12.1*  --  10.7*  --  8.2  --  4.9  --   --  9.8  --   NEUTROABS 10.1*  --  8.8*  --  6.9  --  4.1  --   --  8.8*  --   HGB 15.1   < > 14.9   < > 15.7   < > 12.5* 12.1* 11.9* 12.3* 13.3  HCT 44.5   < > 44.0   < > 47.8   < > 36.5* 36.2* 35.9* 35.4* 40.7  MCV 84.1  --  83.5  --  86.0  --  83.1  --   --  84.3  --   PLT 172  --  155  --  135*  --  122*  --   --  142*  --    < > = values in this interval not displayed.   Basic Metabolic Panel: Recent Labs  Lab 02/10/20 0520 02/11/20 0654 02/11/20 2332  02/12/20 0543 02/13/20 0506  NA 143 139 139 140 142  K 5.4* 3.4* 3.7 4.0 4.2  CL 107 105 106 106 108  CO2 24 19* 21* 25 23  GLUCOSE 127* 107* 134* 175* 164*  BUN 35* 37*  35* 35* 36*  CREATININE 1.14 0.98 1.08 1.07 0.97  CALCIUM 8.9 8.6* 8.1* 8.1* 8.1*  MG 2.8* 2.4 2.4 2.6* 2.6*  PHOS 4.6 3.4  --  3.6 2.7   GFR: Estimated Creatinine Clearance: 67.2 mL/min (by C-G formula based on SCr of 0.97 mg/dL). Liver Function Tests: Recent Labs  Lab 01/22/2020 1347 02/10/20 0520 02/11/20 0654 02/12/20 0543 02/13/20 0506  AST 80* 67* 54* 56* 83*  ALT 35 35 37 38 40  ALKPHOS 129* 120 116 104 269*  BILITOT 2.2* 2.0* 2.4* 2.7* 2.5*  PROT 6.6 6.6 6.4* 5.6* 6.0*  ALBUMIN 2.9* 2.9* 2.7* 2.3* 2.3*   Lipid Profile: No results for input(s): CHOL, HDL, LDLCALC, TRIG, CHOLHDL, LDLDIRECT in the last 72 hours. Thyroid Function Tests: Recent Labs    02/12/20 0543  TSH 1.025  FREET4 1.02   Anemia Panel: Recent Labs    02/12/20 0543 02/13/20 0506  VITAMINB12 2,509*  --   FOLATE 8.3  --   FERRITIN 1,012* 1,376*  TIBC 136*  --   IRON 28*  --   RETICCTPCT 1.1  --    Sepsis Labs: Recent Labs  Lab 02/08/2020 1347 01/27/2020 1405 02/11/20 2332 02/12/20 0543 02/13/20 0951 02/13/20 1303  PROCALCITON 0.18  --   --   --   --   --   LATICACIDVEN  --    < > 3.9* 2.3* 2.9* 3.2*   < > = values in this interval not displayed.    Recent Results (from the past 240 hour(s))  SARS Coronavirus 2 by RT PCR (hospital order, performed in [facility] hospital lab) Nasopharyngeal Nasopharyngeal Swab     Status: Abnormal   Collection Time: 01/28/2020  2:05 PM   Specimen: Nasopharyngeal Swab  Result Value Ref Range Status   SARS Coronavirus 2 POSITIVE (A) NEGATIVE Final    Comment: RESULT CALLED TO, READ BACK BY AND VERIFIED WITH: AMY COWEN 01/22/2020 AT 1534 BY ACR (NOTE) SARS-CoV-2 target nucleic acids are DETECTED  SARS-CoV-2 RNA is generally detectable in upper respiratory specimens  during the acute  phase of infection.  Positive results are indicative  of the presence of the identified virus, but do not rule out bacterial infection or co-infection with other pathogens not detected by the test.  Clinical correlation with patient history and  other diagnostic information is necessary to determine patient infection status.  The expected result is negative.  Fact Sheet for Patients:   StrictlyIdeas.no   Fact Sheet for Healthcare Providers:   BankingDealers.co.za    This test is not yet approved or cleared by the [place] FDA and  has been authorized for detection and/or diagnosis of SARS-CoV-2 by FDA under an Emergency Use Authorization (EUA).  This EUA will remain in effect (meaning this test  can be used) for the duration of  the COVID-19 declaration under Section 564(b)(1) of the Act, 21 U.S.C. section 360-bbb-3(b)(1), unless the authorization is terminated or revoked sooner.  Performed at [facility], [address] [place], [place] [address]   Blood Culture (routine x 2)     Status: None (Preliminary result)   Collection Time: 01/25/2020  2:20 PM   Specimen: BLOOD  Result Value Ref Range Status   Specimen Description BLOOD BLOOD LEFT ARM  Final   Special Requests   Final    BOTTLES DRAWN AEROBIC AND ANAEROBIC Blood Culture adequate volume   Culture   Final    NO GROWTH 4 DAYS Performed at [facility],  [place], [place] [address]    Report Status PENDING  Incomplete  Blood Culture (routine x 2)     Status: None (Preliminary result)   Collection Time: 02/02/2020  2:28 PM   Specimen: BLOOD  Result Value Ref Range Status   Specimen Description BLOOD RIGHT ANTECUBITAL  Final   Special Requests   Final    BOTTLES DRAWN AEROBIC AND ANAEROBIC Blood Culture adequate volume   Culture   Final    NO GROWTH 4 DAYS Performed at [facility], [address], [place], [place] [address]      Report Status PENDING  Incomplete  Culture, blood (Routine X 2) w Reflex to ID Panel     Status: None (Preliminary result)   Collection Time: 02/11/20  2:19 PM   Specimen: BLOOD  Result Value Ref Range Status   Specimen Description BLOOD LEFT ANTECUBITAL  Final   Special Requests   Final    BOTTLES DRAWN AEROBIC AND ANAEROBIC Blood Culture adequate volume   Culture  Setup Time   Final    AEROBIC BOTTLE ONLY GRAM POSITIVE RODS CRITICAL RESULT CALLED TO, READ BACK BY AND VERIFIED WITH: SUSAN WATSON @1915  02/12/20 MJU Performed at [facility], [address] [place], [place] [address]    Culture GRAM POSITIVE RODS  Final   Report Status PENDING  Incomplete  Culture, blood (Routine X 2) w Reflex to ID Panel     Status: Abnormal (Preliminary result)   Collection Time: 02/11/20  2:24 PM   Specimen: BLOOD  Result Value Ref Range Status   Specimen Description   Final    BLOOD BLOOD LEFT HAND Performed at [facility], [address] [place], [place] [address]    Special Requests   Final    BOTTLES DRAWN AEROBIC AND ANAEROBIC Blood Culture adequate volume Performed at [facility], [address] [place], [place] [address]    Culture  Setup Time   Final    GRAM POSITIVE COCCI AEROBIC BOTTLE ONLY Organism ID to follow CRITICAL RESULT CALLED TO, READ BACK BY AND VERIFIED WITH: JASON ROBBINS AT 7510 ON 02/12/2020 [place]. Performed at [facility], [address], [place], [place] [address]    Culture (A)  Final    STREPTOCOCCI, ALPHA HEMOLYTIC IDENTIFICATION TO FOLLOW Performed at [facility], [place] [address] [place], [place] [address]    Report Status PENDING  Incomplete  Blood Culture ID Panel (Reflexed)     Status: Abnormal   Collection Time: 02/11/20  2:24 PM  Result Value Ref Range Status   Enterococcus faecalis NOT DETECTED NOT DETECTED Final   Enterococcus Faecium NOT DETECTED NOT DETECTED Final   Listeria monocytogenes NOT  DETECTED NOT DETECTED Final   Staphylococcus species NOT DETECTED NOT DETECTED Final   Staphylococcus aureus (BCID) NOT DETECTED NOT DETECTED Final   Staphylococcus epidermidis NOT DETECTED NOT DETECTED Final   Staphylococcus lugdunensis NOT DETECTED NOT DETECTED Final   Streptococcus species DETECTED (A) NOT DETECTED Final    Comment: Not Enterococcus species, Streptococcus agalactiae, Streptococcus pyogenes, or Streptococcus pneumoniae. CRITICAL RESULT CALLED TO, READ BACK BY AND VERIFIED WITH: JASON ROBBINS AT 0607 ON 02/12/2020 [place].    Streptococcus agalactiae NOT DETECTED NOT DETECTED Final   Streptococcus pneumoniae NOT DETECTED NOT DETECTED Final   Streptococcus pyogenes NOT DETECTED NOT DETECTED Final   A.calcoaceticus-baumannii NOT DETECTED NOT DETECTED Final   Bacteroides fragilis NOT DETECTED NOT DETECTED Final   Enterobacterales NOT  DETECTED NOT DETECTED Final   Enterobacter cloacae complex NOT DETECTED NOT DETECTED Final   Escherichia coli NOT DETECTED NOT DETECTED Final   Klebsiella aerogenes NOT DETECTED NOT DETECTED Final   Klebsiella oxytoca NOT DETECTED NOT DETECTED Final   Klebsiella pneumoniae NOT DETECTED NOT DETECTED Final   Proteus species NOT DETECTED NOT DETECTED Final   Salmonella species NOT DETECTED NOT DETECTED Final   Serratia marcescens NOT DETECTED NOT DETECTED Final   Haemophilus influenzae NOT DETECTED NOT DETECTED Final   Neisseria meningitidis NOT DETECTED NOT DETECTED Final   Pseudomonas aeruginosa NOT DETECTED NOT DETECTED Final   Stenotrophomonas maltophilia NOT DETECTED NOT DETECTED Final   Candida albicans NOT DETECTED NOT DETECTED Final   Candida auris NOT DETECTED NOT DETECTED Final   Candida glabrata NOT DETECTED NOT DETECTED Final   Candida krusei NOT DETECTED NOT DETECTED Final   Candida parapsilosis NOT DETECTED NOT DETECTED Final   Candida tropicalis NOT DETECTED NOT DETECTED Final   Cryptococcus neoformans/gattii NOT DETECTED NOT  DETECTED Final    Comment: Performed at [facility], [address], [place], [place] [address]     Radiology Studies: DG Chest 1 View  Result Date: 02/13/2020 CLINICAL DATA:  CHF, COVID positive, coronary disease, smoker EXAM: CHEST  1 VIEW COMPARISON:  02/11/2020 FINDINGS: Worsening diffuse mixed interstitial and airspace opacities throughout both lungs. Stable mild cardiomegaly. No enlarging effusion or pneumothorax. Trachea midline. Aorta atherosclerotic and degenerative changes of the spine. IMPRESSION: Worsening diffuse bilateral pneumonia pattern Electronically Signed   By: Jerilynn Mages.  Shick M.D.   On: 02/13/2020 08:27   CT ANGIO CHEST PE W OR WO CONTRAST  Result Date: 02/12/2020 CLINICAL DATA:  Respiratory failure. Recent exposure to COVID-19 with subsequent positive COVID PCR test 10 days ago. History of heart failure secondary to ischemic cardiomyopathy. EXAM: CT ANGIOGRAPHY CHEST WITH CONTRAST TECHNIQUE: Multidetector CT imaging of the chest was performed using the standard protocol during bolus administration of intravenous contrast. Multiplanar CT image reconstructions and MIPs were obtained to evaluate the vascular anatomy. CONTRAST:  69mL OMNIPAQUE IOHEXOL 350 MG/ML SOLN COMPARISON:  02/01/2020 FINDINGS: Cardiovascular: The main pulmonary artery is patent. No central obstructing pulmonary emboli. There are no lobar or segmental pulmonary artery filling defects identified. Cardiac enlargement. Aortic atherosclerosis. Extensive coronary artery atherosclerotic calcifications. No pericardial effusion. Mediastinum/Nodes: Insert thyroid The trachea appears patent and is midline. Normal appearance of the esophagus. Mediastinal enlarged subcarinal lymph node measures 1.5 cm, image 48/4. Prominent right paratracheal and prevascular lymph nodes identified. Bilateral hilar adenopathy is noted including 1.7 cm right hilar lymph node, image 45/4. Left hilar lymph node measures 1.2 cm, image 46/4.  Lungs/Pleura: Centrilobular and paraseptal emphysema identified. There is bilateral lower lung zone predominant interlobular septal thickening. Diffuse ground-glass opacities are also noted throughout both lungs, most severe within the lower lung zones. No lobar consolidation, atelectasis or pneumothorax. Upper Abdomen: No acute abnormality within the imaged portions of the upper abdomen. Aortic atherosclerosis noted. Musculoskeletal: Mild spondylosis within the thoracic spine. No acute or suspicious findings. Review of the MIP images confirms the above findings. IMPRESSION: 1. No evidence for acute pulmonary embolus. 2. Diffuse interlobular septal thickening and ground-glass opacification throughout both lungs compatible with COVID-19 pneumonia. 3. Enlarged mediastinal and bilateral hilar lymph nodes. Similar. Favor reactive adenopathy. 4. Extensive coronary artery atherosclerotic calcifications noted. Aortic Atherosclerosis (ICD10-I70.0) and Emphysema (ICD10-J43.9). Electronically Signed   By: Kerby Moors M.D.   On: 02/12/2020 11:10   Scheduled Meds: . albuterol  2  puff Inhalation Q6H  . vitamin C  500 mg Oral Daily  . aspirin EC  81 mg Oral Daily  . atorvastatin  80 mg Oral Daily  . Chlorhexidine Gluconate Cloth  6 each Topical Q0600  . feeding supplement (ENSURE ENLIVE)  237 mL Oral TID BM  . furosemide  20 mg Intravenous Q12H  . heparin injection (subcutaneous)  5,000 Units Subcutaneous Q8H  . midodrine  2.5 mg Oral TID WC  . multivitamin with minerals  1 tablet Oral Daily  . predniSONE  50 mg Oral Daily  . sodium chloride flush  3 mL Intravenous Q12H  . ticagrelor  90 mg Oral BID  . zinc sulfate  220 mg Oral Daily   Continuous Infusions: . ampicillin-sulbactam (UNASYN) IV 3 g (02/13/20 1322)     LOS: 4 days    Para Skeans, MD [facility] Pager [phone] If 7PM-7AM, please contact night-coverage [email or web address] Password [facility] 02/13/2020, 3:07 PM

## 2020-02-13 NOTE — Progress Notes (Signed)
MD aware of lactic 3.2

## 2020-02-13 NOTE — Progress Notes (Signed)
Patient continues on HFNC and non-rebreather, sitter at bedside. Patient transferred to PCU  For Bipap per order. Patients wife  notified of transfer.[name]

## 2020-02-13 NOTE — Progress Notes (Signed)
Dr Girard Cooter aware that po2 52, placed pt on NRB mask as well as HFNC

## 2020-02-13 NOTE — Progress Notes (Addendum)
Wife given daily update, used ipad for face to face call with pt, aware of current status and that pt not being compliant with keeping mask on, states that her husband is very stubborn, also aware that ive updated MD and NP with palliative and that palliative will be speaking with her and pt today

## 2020-02-13 NOTE — Care Management Important Message (Signed)
Important Message  Patient Details  Name: Nicholas Schultz MRN: [id number] Date of Birth: 09/04/1945   Medicare Important Message Given:  Yes  Reviewed with spouse, Eason Housman at [phone].  Declined copy as one being sent via mail by Admitting already.     Dannette Barbara 02/13/2020, 2:42 PM

## 2020-02-13 NOTE — Progress Notes (Addendum)
Pt continues to take NRB mask off, educated on importance of keeping mask on and risk of taking mask off, states that he doesn't like it on his face, but allowed me to put it back on him

## 2020-02-13 NOTE — Progress Notes (Signed)
Dr Girard Cooter made aware that per report pt pulling 6L o2 [place] off during the night, o2 dropped to 70s-could not recover, placed on HFNC at 15L, listened to pts lung sounds now having rhonchi, new order for stat chest xray and ABG placed

## 2020-02-13 NOTE — Progress Notes (Signed)
MD aware of lactic 2.9

## 2020-02-13 NOTE — Consult Note (Signed)
Nicholas Schultz is a 74 y.o. male  [id number]  Primary Cardiologist: [name]/[facility]. [facility] Reason for Consultation: Congestive heart failure  HPI: Is a 74 year old white male who is admitted at [facility] unit and apparently became fluid overloaded with history of lactic acidosis. I was consulted to evaluate because patient is hypotensive also and is more confused compared to yesterday.   Review of Systems: No chest pain some shortness of breath   Past Medical History:  Diagnosis Date  . CAD (coronary artery disease)    a. s/p prior MI and stenting x 4 in [place], [place]; b. 06/2018 Ant STEMI/PCI: LM mod dzs, LAD 50ost, 99p (2.75x16 Synergy DES), 30p/m ISR, LCX nl, OM1 90, OM2 mod dzs, RCA large, 68m ISR, RPDA 70ost, RPAV 50.  [name] HFrEF (heart failure with reduced ejection fraction) ([place])    a. 06/2018 Echo: EF 30-35%.  . Ischemic cardiomyopathy    a. 06/2018 Echo: EF 30-35%, apical septal, mid and apical lateral, apical, apical inf, mid and apical ant, and mid antsept AK w/ inflat HK.   . Tobacco abuse    a. 1 cigar/day. Prev smoked 3ppd cigarettes.    Medications Prior to Admission  Medication Sig Dispense Refill  . aspirin EC 81 MG tablet Take 1 tablet (81 mg total) by mouth daily. 30 tablet 11  . atorvastatin (LIPITOR) 80 MG tablet TAKE 1 TABLET BY MOUTH EVERY DAY AT 6 PM 30 tablet 6  . flavoxATE (URISPAS) 100 MG tablet Take 100 mg by mouth as needed (urinary frequency).    . furosemide (LASIX) 40 MG tablet Take 1 tablet (40 mg) by mouth once a day for 2-3 days until weight down 5 lbs. Then take 1 tablet (40 mg) by mouth daily as needed for swelling. 30 tablet 5  . nitroGLYCERIN (NITROSTAT) 0.4 MG SL tablet Place 1 tablet (0.4 mg total) under the tongue every 5 (five) minutes x 3 doses as needed for chest pain. 25 tablet 2  . ticagrelor (BRILINTA) 90 MG TABS tablet Take 1 tablet (90 mg total) by mouth 2 (two) times daily. 180 tablet 2     . albuterol  2 puff Inhalation Q6H  .  vitamin C  500 mg Oral Daily  . aspirin EC  81 mg Oral Daily  . atorvastatin  80 mg Oral Daily  . baricitinib  4 mg Oral Daily  . Chlorhexidine Gluconate Cloth  6 each Topical Q0600  . feeding supplement (ENSURE ENLIVE)  237 mL Oral TID BM  . heparin injection (subcutaneous)  5,000 Units Subcutaneous Q8H  . multivitamin with minerals  1 tablet Oral Daily  . predniSONE  50 mg Oral Daily  . sodium chloride flush  3 mL Intravenous Q12H  . ticagrelor  90 mg Oral BID  . zinc sulfate  220 mg Oral Daily    Infusions: . piperacillin-tazobactam (ZOSYN)  IV 12.5 mL/hr at 02/13/20 0747  . vancomycin Stopped (02/13/20 0316)    No Known Allergies  Social History   Socioeconomic History  . Marital status: Married    Spouse name: Not on file  . Number of children: Not on file  . Years of education: Not on file  . Highest education level: Not on file  Occupational History  . Not on file  Tobacco Use  . Smoking status: Current Every Day Smoker    Types: Cigars  . Smokeless tobacco: Never Used  . Tobacco comment: 1 per day  Vaping Use  . Vaping  Use: Never used  Substance and Sexual Activity  . Alcohol use: Yes    Comment: rare  . Drug use: Never  . Sexual activity: Not on file  Other Topics Concern  . Not on file  Social History Narrative  . Not on file   Social Determinants of Health   Financial Resource Strain:   . Difficulty of Paying Living Expenses: Not on file  Food Insecurity:   . Worried About Charity fundraiser in the Last Year: Not on file  . Ran Out of Food in the Last Year: Not on file  Transportation Needs:   . Lack of Transportation (Medical): Not on file  . Lack of Transportation (Non-Medical): Not on file  Physical Activity:   . Days of Exercise per Week: Not on file  . Minutes of Exercise per Session: Not on file  Stress:   . Feeling of Stress : Not on file  Social Connections:   . Frequency of Communication with Friends and Family: Not on file  .  Frequency of Social Gatherings with Friends and Family: Not on file  . Attends Religious Services: Not on file  . Active Member of Clubs or Organizations: Not on file  . Attends Archivist Meetings: Not on file  . Marital Status: Not on file  Intimate Partner Violence:   . Fear of Current or Ex-Partner: Not on file  . Emotionally Abused: Not on file  . Physically Abused: Not on file  . Sexually Abused: Not on file    Family History  Problem Relation Age of Onset  . Cancer Mother   . Heart disease Father     PHYSICAL EXAM: Vitals:   02/13/20 0751 02/13/20 0838  BP: 96/80   Pulse: 70   Resp: 18   Temp: (!) 97.2 F (36.2 C)   SpO2: (!) 86% (!) 89%     Intake/Output Summary (Last 24 hours) at 02/13/2020 1013 Last data filed at 02/13/2020 0747 Gross per 24 hour  Intake 2492.36 ml  Output 1050 ml  Net 1442.36 ml    General:  Well appearing. No respiratory difficulty HEENT: normal Neck: supple. no JVD. Carotids 2+ bilat; no bruits. No lymphadenopathy or thryomegaly appreciated. Cor: PMI nondisplaced. Regular rate & rhythm. No rubs, gallops or murmurs. Lungs: clear Abdomen: soft, nontender, nondistended. No hepatosplenomegaly. No bruits or masses. Good bowel sounds. Extremities: no cyanosis, clubbing, rash, edema Neuro: alert & oriented x 3, cranial nerves grossly intact. moves all 4 extremities w/o difficulty. Affect pleasant.  ECG: Sinus tachycardia with left bundle branch block nonspecific ST-T changes  Results for orders placed or performed during the hospital encounter of 02/02/2020 (from the past 24 hour(s))  Type and screen     Status: None   Collection Time: 02/12/20 11:27 AM  Result Value Ref Range   ABO/RH(D) A POS    Antibody Screen NEG    Sample Expiration      02/15/2020,2359 Performed at [facility], [address] [place], [place] [address]   Hemoglobin and hematocrit, blood     Status: Abnormal   Collection Time: 02/12/20  1:23 PM   Result Value Ref Range   Hemoglobin 12.1 (L) 13.0 - 17.0 g/dL   HCT 36.2 (L) 39 - 52 %  Hemoglobin and hematocrit, blood     Status: Abnormal   Collection Time: 02/12/20 10:43 PM  Result Value Ref Range   Hemoglobin 11.9 (L) 13.0 - 17.0 g/dL   HCT 35.9 (L)  39 - 52 %  CBC with Differential/Platelet     Status: Abnormal   Collection Time: 02/13/20  5:06 AM  Result Value Ref Range   WBC 9.8 4.0 - 10.5 K/uL   RBC 4.20 (L) 4.22 - 5.81 MIL/uL   Hemoglobin 12.3 (L) 13.0 - 17.0 g/dL   HCT 35.4 (L) 39 - 52 %   MCV 84.3 80.0 - 100.0 fL   MCH 29.3 26.0 - 34.0 pg   MCHC 34.7 30.0 - 36.0 g/dL   RDW 16.9 (H) 11.5 - 15.5 %   Platelets 142 (L) 150 - 400 K/uL   nRBC 0.0 0.0 - 0.2 %   Neutrophils Relative % 90 %   Neutro Abs 8.8 (H) 1.7 - 7.7 K/uL   Lymphocytes Relative 5 %   Lymphs Abs 0.5 (L) 0.7 - 4.0 K/uL   Monocytes Relative 4 %   Monocytes Absolute 0.4 0 - 1 K/uL   Eosinophils Relative 0 %   Eosinophils Absolute 0.0 0 - 0 K/uL   Basophils Relative 0 %   Basophils Absolute 0.0 0 - 0 K/uL   WBC Morphology MORPHOLOGY UNREMARKABLE    Immature Granulocytes 1 %   Abs Immature Granulocytes 0.06 0.00 - 0.07 K/uL   Burr Cells PRESENT   Comprehensive metabolic panel     Status: Abnormal   Collection Time: 02/13/20  5:06 AM  Result Value Ref Range   Sodium 142 135 - 145 mmol/L   Potassium 4.2 3.5 - 5.1 mmol/L   Chloride 108 98 - 111 mmol/L   CO2 23 22 - 32 mmol/L   Glucose, Bld 164 (H) 70 - 99 mg/dL   BUN 36 (H) 8 - 23 mg/dL   Creatinine, Ser 0.97 0.61 - 1.24 mg/dL   Calcium 8.1 (L) 8.9 - 10.3 mg/dL   Total Protein 6.0 (L) 6.5 - 8.1 g/dL   Albumin 2.3 (L) 3.5 - 5.0 g/dL   AST 83 (H) 15 - 41 U/L   ALT 40 0 - 44 U/L   Alkaline Phosphatase 269 (H) 38 - 126 U/L   Total Bilirubin 2.5 (H) 0.3 - 1.2 mg/dL   GFR calc non Af Amer >60 >60 mL/min   GFR calc Af Amer >60 >60 mL/min   Anion gap 11 5 - 15  Fibrin derivatives D-Dimer ([facility] only)     Status: Abnormal   Collection Time: 02/13/20   5:06 AM  Result Value Ref Range   Fibrin derivatives D-dimer ([facility]) 5,679.57 (H) 0.00 - 499.00 ng/mL (FEU)  Ferritin     Status: Abnormal   Collection Time: 02/13/20  5:06 AM  Result Value Ref Range   Ferritin 1,376 (H) 24 - 336 ng/mL  Magnesium     Status: Abnormal   Collection Time: 02/13/20  5:06 AM  Result Value Ref Range   Magnesium 2.6 (H) 1.7 - 2.4 mg/dL  Phosphorus     Status: None   Collection Time: 02/13/20  5:06 AM  Result Value Ref Range   Phosphorus 2.7 2.5 - 4.6 mg/dL  Lactate dehydrogenase     Status: Abnormal   Collection Time: 02/13/20  5:06 AM  Result Value Ref Range   LDH 1,110 (H) 98 - 192 U/L  Blood gas, arterial     Status: Abnormal   Collection Time: 02/13/20  7:32 AM  Result Value Ref Range   FIO2 0.80    pH, Arterial 7.47 (H) 7.35 - 7.45   pCO2 arterial 28 (L) 32 - 48 mmHg  pO2, Arterial 52 (L) 83 - 108 mmHg   Bicarbonate 20.4 20.0 - 28.0 mmol/L   Acid-base deficit 2.2 (H) 0.0 - 2.0 mmol/L   O2 Saturation 88.8 %   Patient temperature 37.0    Collection site RIGHT RADIAL    Sample type ARTERIAL DRAW    Allens test (pass/fail) PASS PASS   DG Chest 1 View  Result Date: 02/13/2020 CLINICAL DATA:  CHF, COVID positive, coronary disease, smoker EXAM: CHEST  1 VIEW COMPARISON:  02/11/2020 FINDINGS: Worsening diffuse mixed interstitial and airspace opacities throughout both lungs. Stable mild cardiomegaly. No enlarging effusion or pneumothorax. Trachea midline. Aorta atherosclerotic and degenerative changes of the spine. IMPRESSION: Worsening diffuse bilateral pneumonia pattern Electronically Signed   By: Jerilynn Mages.  Shick M.D.   On: 02/13/2020 08:27   CT ANGIO CHEST PE W OR WO CONTRAST  Result Date: 02/12/2020 CLINICAL DATA:  Respiratory failure. Recent exposure to COVID-19 with subsequent positive COVID PCR test 10 days ago. History of heart failure secondary to ischemic cardiomyopathy. EXAM: CT ANGIOGRAPHY CHEST WITH CONTRAST TECHNIQUE: Multidetector CT imaging of  the chest was performed using the standard protocol during bolus administration of intravenous contrast. Multiplanar CT image reconstructions and MIPs were obtained to evaluate the vascular anatomy. CONTRAST:  31mL OMNIPAQUE IOHEXOL 350 MG/ML SOLN COMPARISON:  01/28/2020 FINDINGS: Cardiovascular: The main pulmonary artery is patent. No central obstructing pulmonary emboli. There are no lobar or segmental pulmonary artery filling defects identified. Cardiac enlargement. Aortic atherosclerosis. Extensive coronary artery atherosclerotic calcifications. No pericardial effusion. Mediastinum/Nodes: Insert thyroid The trachea appears patent and is midline. Normal appearance of the esophagus. Mediastinal enlarged subcarinal lymph node measures 1.5 cm, image 48/4. Prominent right paratracheal and prevascular lymph nodes identified. Bilateral hilar adenopathy is noted including 1.7 cm right hilar lymph node, image 45/4. Left hilar lymph node measures 1.2 cm, image 46/4. Lungs/Pleura: Centrilobular and paraseptal emphysema identified. There is bilateral lower lung zone predominant interlobular septal thickening. Diffuse ground-glass opacities are also noted throughout both lungs, most severe within the lower lung zones. No lobar consolidation, atelectasis or pneumothorax. Upper Abdomen: No acute abnormality within the imaged portions of the upper abdomen. Aortic atherosclerosis noted. Musculoskeletal: Mild spondylosis within the thoracic spine. No acute or suspicious findings. Review of the MIP images confirms the above findings. IMPRESSION: 1. No evidence for acute pulmonary embolus. 2. Diffuse interlobular septal thickening and ground-glass opacification throughout both lungs compatible with COVID-19 pneumonia. 3. Enlarged mediastinal and bilateral hilar lymph nodes. Similar. Favor reactive adenopathy. 4. Extensive coronary artery atherosclerotic calcifications noted. Aortic Atherosclerosis (ICD10-I70.0) and Emphysema  (ICD10-J43.9). Electronically Signed   By: Kerby Moors M.D.   On: 02/12/2020 11:10   DG Chest Port 1 View  Result Date: 02/11/2020 CLINICAL DATA:  Respiratory failure.  COVID-19 infection. EXAM: PORTABLE CHEST 1 VIEW COMPARISON:  CT chest and chest x-ray dated February 09, 2020. FINDINGS: Stable cardiomediastinal silhouette. Patchy bilateral interstitial and hazy airspace opacities have mildly progressed since the prior study. No pleural effusion or pneumothorax. No acute osseous abnormality. IMPRESSION: 1. Mild progression of multifocal pneumonia. Electronically Signed   By: Titus Dubin M.D.   On: 02/11/2020 16:27     ASSESSMENT AND PLAN: History of cardiomyopathy with severe LV dysfunction. Patient can be given small dose of enalapril 2.5 mg once a day. May also give Lasix 20 mg IV now and then reassess and if needed further doses. Ideally patient should be also on Aldactone may be 12.5 once a day once blood pressure recovers. Also ideally  patient should have echocardiogram but when Covid unit will be difficult but prior echocardiogram a year ago had ejection fraction 25%. Just be careful with giving fluids.  Kaian Fahs A

## 2020-02-13 NOTE — Progress Notes (Signed)
Daily Progress Note   Patient Name: Nicholas Schultz       Date: 02/13/2020 DOB: [birth year]  Age: 74 y.o. MRN#: [id number] Attending Physician: Para Skeans, MD Primary Care Physician: Patient, [name] Per Admit Date: 01/22/2020  Reason for Consultation/Follow-up: Establishing goals of care  Subjective: Notified by RN or increased oxygen needs. Conversation with patient as below.  Length of Stay: 4  Current Medications: Scheduled Meds:  . albuterol  2 puff Inhalation Q6H  . vitamin C  500 mg Oral Daily  . aspirin EC  81 mg Oral Daily  . atorvastatin  80 mg Oral Daily  . Chlorhexidine Gluconate Cloth  6 each Topical Q0600  . feeding supplement (ENSURE ENLIVE)  237 mL Oral TID BM  . furosemide  20 mg Intravenous Q12H  . heparin injection (subcutaneous)  5,000 Units Subcutaneous Q8H  . midodrine  2.5 mg Oral TID WC  . multivitamin with minerals  1 tablet Oral Daily  . predniSONE  50 mg Oral Daily  . sodium chloride flush  3 mL Intravenous Q12H  . ticagrelor  90 mg Oral BID  . zinc sulfate  220 mg Oral Daily    Continuous Infusions: . ampicillin-sulbactam (UNASYN) IV 3 g (02/13/20 1322)    PRN Meds: acetaminophen, guaiFENesin-dextromethorphan, nitroGLYCERIN, ondansetron **OR** ondansetron (ZOFRAN) IV, sodium chloride flush  Physical Exam Pulmonary:     Effort: No tachypnea or accessory muscle usage.     Comments: On HFNC and NRB mask Skin:    General: Skin is warm and dry.  Neurological:     Mental Status: He is lethargic, disoriented and confused.             Vital Signs: BP 102/64 (BP Location: Right Arm)   Pulse 87   Temp (!) 97.1 F (36.2 C)   Resp 18   Ht 5\' 11"  (1.803 m)   Wt 72.2 kg Comment: bed scale  SpO2 91%   BMI 22.20 kg/m  SpO2: SpO2: 91 % O2 Device: O2 Device: NRB,  High Flow Nasal Cannula O2 Flow Rate: O2 Flow Rate (L/min): 15 L/min  Intake/output summary:   Intake/Output Summary (Last 24 hours) at 02/13/2020 1443 Last data filed at 02/13/2020 1200 Gross per 24 hour  Intake 2732.36 ml  Output 725 ml  Net 2007.36 ml   LBM: Last BM Date: 02/07/20 Baseline Weight: Weight: 77.1 kg Most recent weight: Weight: 72.2 kg (bed scale)       Palliative Assessment/Data: PPS 20%    Flowsheet Rows     Most Recent Value  Intake Tab  Referral Department Hospitalist  Unit at Time of Referral Med/Surg Unit  Palliative Care Primary Diagnosis Sepsis/Infectious Disease  Date Notified 02/11/20  Palliative Care Type New Palliative care  Reason for referral Clarify Goals of Care  Date of Admission 01/25/2020  Date first seen by Palliative Care 02/11/20  # of days Palliative referral response time 0 Day(s)  # of days IP prior to Palliative referral 2  Clinical Assessment  Palliative Performance Scale Score 20%  Psychosocial & Spiritual Assessment  Palliative Care Outcomes  Patient/Family meeting held? Yes  Who was at the meeting? wife  Palliative Care Outcomes Clarified goals  of care, Provided psychosocial or spiritual support      Patient Active Problem List   Diagnosis Date Noted  . Protein-calorie malnutrition, severe 02/13/2020  . Lactic acidosis 02/11/2020  . Goals of care, counseling/discussion   . Palliative care by specialist   . Pneumonia due to COVID-19 virus 02/04/2020  . Acute respiratory failure due to COVID-19 ([place]) 02/19/2020  . Syncope and collapse 04/10/2019  . ST elevation myocardial infarction (STEMI) of anterior wall, subsequent episode of care ([place]) 03/18/2019  . Coronary artery disease involving native coronary artery of native heart without angina pectoris 09/11/2018  . Chronic systolic heart failure ([place]) 09/11/2018  . Tobacco abuse 09/11/2018  . CAD S/P percutaneous coronary angioplasty prox LAD 07/19/2018  . Ischemic  cardiomyopathy 07/19/2018  . Hyperlipidemia LDL goal <70 07/19/2018  . STEMI (ST elevation myocardial infarction) ([place]) 07/17/2018    Palliative Care Assessment & Plan   HPI: 74 y.o. male  with past medical history of CAD, HF (EF 30-25%), and tobacco use admitted on 02/05/2020 with COVID-19 pneumonia. Has required 10-15 L HFNC. Drowsy and slightly confused. Poor PO intake. BP soft. PMT consulted to discuss [place].    Assessment: Called by RN d/t increasing oxygen requirements.  Went to see patient - he was conversant but confused. We discuss increase oxygen needs and possibility of transfer to higher level of care. We discuss potential need for intubation. He indicates he would want this; however, he is confused throughout conversation. At one point, did not realize he was in the hospital.  I attempted to call wife to provide update and express concern about patient status. Wife did not answer and I was unable to leave a voicemail. Please see note from 9/22 for initial goals of care conversation with wife. Essentially, she also expressed interest in full code/full scope interventions - would only want "short-term" ventilator support.   Recommendations/Plan:  Full code/full scope (only want short-term vent support)  PMT to follow up next week  Code Status:  Full code  Prognosis:   Unable to determine  Discharge Planning:  To Be Determined  Care plan was discussed with RN  Thank you for allowing the Palliative Medicine Team to assist in the care of this patient.   Total Time 25 minutes Prolonged Time Billed  no       Greater than 50%  of this time was spent counseling and coordinating care related to the above assessment and plan.  Juel Burrow, DNP, [facility] Palliative Medicine Team Team Phone # [phone]  Pager [phone]

## 2020-02-14 ENCOUNTER — Inpatient Hospital Stay: Payer: Medicare HMO

## 2020-02-14 LAB — SODIUM: Sodium: 141 mmol/L (ref 135–145)

## 2020-02-14 LAB — CULTURE, BLOOD (ROUTINE X 2)
Culture: NO GROWTH
Culture: NO GROWTH
Special Requests: ADEQUATE
Special Requests: ADEQUATE
Special Requests: ADEQUATE

## 2020-02-14 LAB — COMPREHENSIVE METABOLIC PANEL
ALT: 54 U/L — ABNORMAL HIGH (ref 0–44)
AST: 134 U/L — ABNORMAL HIGH (ref 15–41)
Albumin: 2.5 g/dL — ABNORMAL LOW (ref 3.5–5.0)
Alkaline Phosphatase: 303 U/L — ABNORMAL HIGH (ref 38–126)
Anion gap: 11 (ref 5–15)
BUN: 37 mg/dL — ABNORMAL HIGH (ref 8–23)
CO2: 25 mmol/L (ref 22–32)
Calcium: 8.2 mg/dL — ABNORMAL LOW (ref 8.9–10.3)
Chloride: 107 mmol/L (ref 98–111)
Creatinine, Ser: 0.97 mg/dL (ref 0.61–1.24)
GFR calc Af Amer: >60 mL/min (ref >60)
GFR calc non Af Amer: >60 mL/min (ref >60)
Glucose, Bld: 109 mg/dL — ABNORMAL HIGH (ref 70–99)
Potassium: 3.6 mmol/L (ref 3.5–5.1)
Sodium: 143 mmol/L (ref 135–145)
Total Bilirubin: 3.6 mg/dL — ABNORMAL HIGH (ref 0.3–1.2)
Total Protein: 6.4 g/dL — ABNORMAL LOW (ref 6.5–8.1)

## 2020-02-14 LAB — CBC WITH DIFFERENTIAL/PLATELET
Abs Immature Granulocytes: 0.06 10*3/uL (ref 0.00–0.07)
Basophils Absolute: 0 10*3/uL (ref 0.0–0.1)
Basophils Relative: 0 %
Eosinophils Absolute: 0 10*3/uL (ref 0.0–0.5)
Eosinophils Relative: 0 %
HCT: 35.5 % — ABNORMAL LOW (ref 39.0–52.0)
Hemoglobin: 12.3 g/dL — ABNORMAL LOW (ref 13.0–17.0)
Immature Granulocytes: 1 %
Lymphocytes Relative: 6 %
Lymphs Abs: 0.6 10*3/uL — ABNORMAL LOW (ref 0.7–4.0)
MCH: 28.6 pg (ref 26.0–34.0)
MCHC: 34.6 g/dL (ref 30.0–36.0)
MCV: 82.6 fL (ref 80.0–100.0)
Monocytes Absolute: 0.2 10*3/uL (ref 0.1–1.0)
Monocytes Relative: 2 %
Neutro Abs: 9.5 10*3/uL — ABNORMAL HIGH (ref 1.7–7.7)
Neutrophils Relative %: 91 %
Platelets: 174 10*3/uL (ref 150–400)
RBC: 4.3 MIL/uL (ref 4.22–5.81)
RDW: 17.1 % — ABNORMAL HIGH (ref 11.5–15.5)
WBC: 10.4 10*3/uL (ref 4.0–10.5)
nRBC: 0 % (ref 0.0–0.2)

## 2020-02-14 LAB — C-REACTIVE PROTEIN: CRP: 7.1 mg/dL — ABNORMAL HIGH (ref <1.0)

## 2020-02-14 LAB — HEMOGLOBIN AND HEMATOCRIT, BLOOD
HCT: 33.3 % — ABNORMAL LOW (ref 39.0–52.0)
HCT: 32.5 % — ABNORMAL LOW (ref 39.0–52.0)
Hemoglobin: 11.8 g/dL — ABNORMAL LOW (ref 13.0–17.0)
Hemoglobin: 11.5 g/dL — ABNORMAL LOW (ref 13.0–17.0)

## 2020-02-14 LAB — FERRITIN: Ferritin: 2708 ng/mL — ABNORMAL HIGH (ref 24–336)

## 2020-02-14 LAB — MAGNESIUM
Magnesium: 2.4 mg/dL (ref 1.7–2.4)
Magnesium: 2.2 mg/dL (ref 1.7–2.4)

## 2020-02-14 LAB — FIBRIN DERIVATIVES D-DIMER (ARMC ONLY): Fibrin derivatives D-dimer (ARMC): 5330.36 ng/mL (FEU) — ABNORMAL HIGH (ref 0.00–499.00)

## 2020-02-14 LAB — PHOSPHORUS: Phosphorus: 2.4 mg/dL — ABNORMAL LOW (ref 2.5–4.6)

## 2020-02-14 LAB — LACTATE DEHYDROGENASE: LDH: 1559 U/L — ABNORMAL HIGH (ref 98–192)

## 2020-02-14 MED ORDER — FUROSEMIDE 10 MG/ML IJ SOLN
20.0000 mg | Freq: Two times a day (BID) | INTRAMUSCULAR | Status: AC
  Administered 2020-02-14 (×2): 20 mg via INTRAVENOUS
  Filled 2020-02-14 (×2): qty 2

## 2020-02-14 MED ORDER — MORPHINE SULFATE (PF) 2 MG/ML IV SOLN
1.0000 mg | Freq: Once | INTRAVENOUS | Status: AC
  Administered 2020-02-14: 1 mg via INTRAVENOUS
  Filled 2020-02-14: qty 1

## 2020-02-14 NOTE — Consult Note (Signed)
Nicholas Schultz is a 74 y.o. male  [id number]  Primary Cardiologist: Neoma Laming Reason for Consultation: Respiratory failure tachycardia  HPI: Is a 74 year old white male with Covid pneumonia who is in respiratory failure with tachypnea tachycardia and shortness of breath.   Review of Systems: Confused   Past Medical History:  Diagnosis Date  . CAD (coronary artery disease)    a. s/p prior MI and stenting x 4 in [place], [place]; b. 06/2018 Ant STEMI/PCI: LM mod dzs, LAD 50ost, 99p (2.75x16 Synergy DES), 30p/m ISR, LCX nl, OM1 90, OM2 mod dzs, RCA large, 46m ISR, RPDA 70ost, RPAV 50.  [name] HFrEF (heart failure with reduced ejection fraction) ([place])    a. 06/2018 Echo: EF 30-35%.  . Ischemic cardiomyopathy    a. 06/2018 Echo: EF 30-35%, apical septal, mid and apical lateral, apical, apical inf, mid and apical ant, and mid antsept AK w/ inflat HK.   . Tobacco abuse    a. 1 cigar/day. Prev smoked 3ppd cigarettes.    Medications Prior to Admission  Medication Sig Dispense Refill  . aspirin EC 81 MG tablet Take 1 tablet (81 mg total) by mouth daily. 30 tablet 11  . atorvastatin (LIPITOR) 80 MG tablet TAKE 1 TABLET BY MOUTH EVERY DAY AT 6 PM 30 tablet 6  . flavoxATE (URISPAS) 100 MG tablet Take 100 mg by mouth as needed (urinary frequency).    . furosemide (LASIX) 40 MG tablet Take 1 tablet (40 mg) by mouth once a day for 2-3 days until weight down 5 lbs. Then take 1 tablet (40 mg) by mouth daily as needed for swelling. 30 tablet 5  . nitroGLYCERIN (NITROSTAT) 0.4 MG SL tablet Place 1 tablet (0.4 mg total) under the tongue every 5 (five) minutes x 3 doses as needed for chest pain. 25 tablet 2  . ticagrelor (BRILINTA) 90 MG TABS tablet Take 1 tablet (90 mg total) by mouth 2 (two) times daily. 180 tablet 2     . albuterol  2 puff Inhalation Q6H  . vitamin C  500 mg Oral Daily  . aspirin EC  81 mg Oral Daily  . Chlorhexidine Gluconate Cloth  6 each Topical Q0600  . feeding supplement (ENSURE  ENLIVE)  237 mL Oral TID BM  . furosemide  20 mg Intravenous Q12H  . heparin injection (subcutaneous)  5,000 Units Subcutaneous Q8H  . midodrine  2.5 mg Oral TID WC  . multivitamin with minerals  1 tablet Oral Daily  . predniSONE  50 mg Oral Daily  . sodium chloride flush  3 mL Intravenous Q12H  . ticagrelor  90 mg Oral BID  . zinc sulfate  220 mg Oral Daily    Infusions: . ampicillin-sulbactam (UNASYN) IV 3 g (02/14/20 1249)    No Known Allergies  Social History   Socioeconomic History  . Marital status: Married    Spouse name: Not on file  . Number of children: Not on file  . Years of education: Not on file  . Highest education level: Not on file  Occupational History  . Not on file  Tobacco Use  . Smoking status: Current Every Day Smoker    Types: Cigars  . Smokeless tobacco: Never Used  . Tobacco comment: 1 per day  Vaping Use  . Vaping Use: Never used  Substance and Sexual Activity  . Alcohol use: Yes    Comment: rare  . Drug use: Never  . Sexual activity: Not on file  Other  Topics Concern  . Not on file  Social History Narrative  . Not on file   Social Determinants of Health   Financial Resource Strain:   . Difficulty of Paying Living Expenses: Not on file  Food Insecurity:   . Worried About Charity fundraiser in the Last Year: Not on file  . Ran Out of Food in the Last Year: Not on file  Transportation Needs:   . Lack of Transportation (Medical): Not on file  . Lack of Transportation (Non-Medical): Not on file  Physical Activity:   . Days of Exercise per Week: Not on file  . Minutes of Exercise per Session: Not on file  Stress:   . Feeling of Stress : Not on file  Social Connections:   . Frequency of Communication with Friends and Family: Not on file  . Frequency of Social Gatherings with Friends and Family: Not on file  . Attends Religious Services: Not on file  . Active Member of Clubs or Organizations: Not on file  . Attends Theatre manager Meetings: Not on file  . Marital Status: Not on file  Intimate Partner Violence:   . Fear of Current or Ex-Partner: Not on file  . Emotionally Abused: Not on file  . Physically Abused: Not on file  . Sexually Abused: Not on file    Family History  Problem Relation Age of Onset  . Cancer Mother   . Heart disease Father     PHYSICAL EXAM: Vitals:   02/14/20 1426 02/14/20 1427  BP: 96/70   Pulse: (!) 103 84  Resp: 16 13  Temp:    SpO2: 98% 98%     Intake/Output Summary (Last 24 hours) at 02/14/2020 1433 Last data filed at 02/14/2020 0429 Gross per 24 hour  Intake 252.4 ml  Output 3200 ml  Net -2947.6 ml    General:  Well appearing. No respiratory difficulty HEENT: normal Neck: supple. no JVD. Carotids 2+ bilat; no bruits. No lymphadenopathy or thryomegaly appreciated. Cor: PMI nondisplaced. Regular rate & rhythm. No rubs, gallops or murmurs. Lungs: clear Abdomen: soft, nontender, nondistended. No hepatosplenomegaly. No bruits or masses. Good bowel sounds. Extremities: no cyanosis, clubbing, rash, edema Neuro: alert & oriented x 3, cranial nerves grossly intact. moves all 4 extremities w/o difficulty. Affect pleasant.  ECG: Last EKG had sinus rhythm with occasional PVCs poor R wave progression  Results for orders placed or performed during the hospital encounter of 01/30/2020 (from the past 24 hour(s))  Hemoglobin and hematocrit, blood     Status: Abnormal   Collection Time: 02/13/20  8:58 PM  Result Value Ref Range   Hemoglobin 12.9 (L) 13.0 - 17.0 g/dL   HCT 38.2 (L) 39 - 52 %  Blood gas, arterial     Status: Abnormal   Collection Time: 02/13/20  9:23 PM  Result Value Ref Range   FIO2 100 AND 15L    pH, Arterial 7.52 (H) 7.35 - 7.45   pCO2 arterial 28 (L) 32 - 48 mmHg   pO2, Arterial 93 83 - 108 mmHg   Bicarbonate 22.9 20.0 - 28.0 mmol/L   Acid-Base Excess 0.9 0.0 - 2.0 mmol/L   O2 Saturation 98.0 %   Patient temperature 37.0    Collection site  RIGHT BRACHIAL    Sample type ARTERIAL DRAW    Allens test (pass/fail) PASS PASS  CBC with Differential/Platelet     Status: Abnormal   Collection Time: 02/14/20  4:26 AM  Result Value  Ref Range   WBC 10.4 4.0 - 10.5 K/uL   RBC 4.30 4.22 - 5.81 MIL/uL   Hemoglobin 12.3 (L) 13.0 - 17.0 g/dL   HCT 35.5 (L) 39 - 52 %   MCV 82.6 80.0 - 100.0 fL   MCH 28.6 26.0 - 34.0 pg   MCHC 34.6 30.0 - 36.0 g/dL   RDW 17.1 (H) 11.5 - 15.5 %   Platelets 174 150 - 400 K/uL   nRBC 0.0 0.0 - 0.2 %   Neutrophils Relative % 91 %   Neutro Abs 9.5 (H) 1.7 - 7.7 K/uL   Lymphocytes Relative 6 %   Lymphs Abs 0.6 (L) 0.7 - 4.0 K/uL   Monocytes Relative 2 %   Monocytes Absolute 0.2 0 - 1 K/uL   Eosinophils Relative 0 %   Eosinophils Absolute 0.0 0 - 0 K/uL   Basophils Relative 0 %   Basophils Absolute 0.0 0 - 0 K/uL   Immature Granulocytes 1 %   Abs Immature Granulocytes 0.06 0.00 - 0.07 K/uL  Comprehensive metabolic panel     Status: Abnormal   Collection Time: 02/14/20  4:26 AM  Result Value Ref Range   Sodium 143 135 - 145 mmol/L   Potassium 3.6 3.5 - 5.1 mmol/L   Chloride 107 98 - 111 mmol/L   CO2 25 22 - 32 mmol/L   Glucose, Bld 109 (H) 70 - 99 mg/dL   BUN 37 (H) 8 - 23 mg/dL   Creatinine, Ser 0.97 0.61 - 1.24 mg/dL   Calcium 8.2 (L) 8.9 - 10.3 mg/dL   Total Protein 6.4 (L) 6.5 - 8.1 g/dL   Albumin 2.5 (L) 3.5 - 5.0 g/dL   AST 134 (H) 15 - 41 U/L   ALT 54 (H) 0 - 44 U/L   Alkaline Phosphatase 303 (H) 38 - 126 U/L   Total Bilirubin 3.6 (H) 0.3 - 1.2 mg/dL   GFR calc non Af Amer >60 >60 mL/min   GFR calc Af Amer >60 >60 mL/min   Anion gap 11 5 - 15  Magnesium     Status: None   Collection Time: 02/14/20  4:26 AM  Result Value Ref Range   Magnesium 2.4 1.7 - 2.4 mg/dL  Phosphorus     Status: Abnormal   Collection Time: 02/14/20  4:26 AM  Result Value Ref Range   Phosphorus 2.4 (L) 2.5 - 4.6 mg/dL  C-reactive protein     Status: Abnormal   Collection Time: 02/14/20  4:26 AM  Result Value  Ref Range   CRP 7.1 (H) <1.0 mg/dL  Ferritin     Status: Abnormal   Collection Time: 02/14/20  4:26 AM  Result Value Ref Range   Ferritin 2,708 (H) 24 - 336 ng/mL  Fibrin derivatives D-Dimer ([facility] only)     Status: Abnormal   Collection Time: 02/14/20  4:26 AM  Result Value Ref Range   Fibrin derivatives D-dimer ([facility]) 5,330.36 (H) 0.00 - 499.00 ng/mL (FEU)  Lactate dehydrogenase     Status: Abnormal   Collection Time: 02/14/20  4:26 AM  Result Value Ref Range   LDH 1,559 (H) 98 - 192 U/L  Hemoglobin and hematocrit, blood     Status: Abnormal   Collection Time: 02/14/20  1:07 PM  Result Value Ref Range   Hemoglobin 11.8 (L) 13.0 - 17.0 g/dL   HCT 33.3 (L) 39 - 52 %   DG Chest 1 View  Result Date: 02/13/2020 CLINICAL DATA:  CHF, COVID  positive, coronary disease, smoker EXAM: CHEST  1 VIEW COMPARISON:  02/11/2020 FINDINGS: Worsening diffuse mixed interstitial and airspace opacities throughout both lungs. Stable mild cardiomegaly. No enlarging effusion or pneumothorax. Trachea midline. Aorta atherosclerotic and degenerative changes of the spine. IMPRESSION: Worsening diffuse bilateral pneumonia pattern Electronically Signed   By: Jerilynn Mages.  Shick M.D.   On: 02/13/2020 08:27     ASSESSMENT AND PLAN: Respiratory failure with tachypnea and tachycardia due to Covid pneumonia.  Patient can get digoxin 0.25 IV daily for tachycardia.  Tachycardia is due to increased respiratory distress due to Covid pneumonia.  Xochitl Egle A

## 2020-02-14 NOTE — Progress Notes (Signed)
Mittens applied at 1000 after patient continually was grabbing non-rebreather off his face with sitter at bedside. With non-rebreather off, his O2 sats hover at 87%. With non-rebreather on, they are 95%.

## 2020-02-14 NOTE — Progress Notes (Signed)
Pt getting more restless, angry but very tired having a hard time keeping his eyes open but won't sit still. Dr. Posey Pronto notified and order later obtained for one time dose of IV morphine.

## 2020-02-14 NOTE — Progress Notes (Signed)
PROGRESS NOTE    Nicholas Schultz  EZM:[id number] DOB: 1945/10/31 DOA: 01/25/2020 PCP: Patient, [name] Per   Brief Narrative:  Nicholas Schultz a 74 y.o.malewith medical history significant forcoronary artery disease, heart failure with reduced EF(last known LVEF 30 to 35%),history of ischemic cardiomyopathy, nicotine dependence who presents to the emergency room for evaluation of worsening shortness of breath. Patient was recently exposed to a family member who had COVID-19 infection and had a positive Covid PCR test about 10 days ago. He is unvaccinated.  His wife has been checking his pulse oximetry at home and today noted that his pulse oximetry had been about 86% which prompted patient's visit to the emergency room for further evaluation. He complains of a fever and chills as well as myalgias and weakness but denies having any cough. He has nausea but no vomiting. He denies having any chest pain, no abdominal pain, no dizziness, no lightheadedness, no urinary symptoms or changes in his bowel habits. Labs show sodium 139, potassium 3.9, chloride 103, bicarb 21, BUN 101, BUN 35, creatinine 1.35, calcium 8.4, alkaline phosphatase 129, ALT 35, AST 80, total protein 6.6, LDH1100,troponin38,ferritin3623, lactic acid4.0,procalcitonin 0.18,white count 12.1, hemoglobin 15.1, hematocrit 44.5, MCV 84.1, RDW 16.8, fibrin derivatives 7,5000 Chest x-ray reviewed by me shows bilateral interstitial opacities. Lead EKG shows sinus tachycardia, PVCs and left bundle branch block  Patient is a 74 year old male with a history of ischemic cardiomyopathy, chronic systolic heart failure with last known LVEF of 30 to 35% who presents to the emergency room via EMS for evaluation of hypoxia. Patient had a positive Covid test about 10 days ago and has been checking his pulse oximetry at home.Patient noted to have room air pulse oximetry of 86% prompting a visit to the emergency room. Patient has been  hypotensive with systolic blood pressure in the 80s but is awake and alert. He received 1 L IV fluid hydration in the ER with improvement in his blood pressure. He also received remdesivir and prednisone. He is currently on high flow oxygen and will be admitted to the hospital for further evaluation.  Assessment & Plan:   Principal Problem:   Acute respiratory failure due to COVID-19 [facility]) Active Problems:   Chronic systolic heart failure (HCC)   Lactic acidosis   Goals of care, counseling/discussion   Palliative care by specialist   Protein-calorie malnutrition, severe Acute hypoxic respiratory failure and severe sepsis secondary to COVID-19 pneumonia:  Patient meets severe sepsis criteria based on tachycardia, tachypnea, leukocytosis, hypoxia and lactic acid of 4.0.  Patient was requiring up to 13 L of high flow oxygen up until last night but this morning, he states that he was feeling better and currently he is on 6 L of high flow.  Significantly elevated D-dimer.  CT angiogram negative for PE.  Slight improvement in inflammatory markers otherwise.  Continue to wean oxygen.  Repeat lactic acid.  Continue following Remdesivir per pharmacy protocol IV Solu-Medrol Bronchodilator, antitussive.  Patient was encouraged to prone, out of bed to chair, to use incentive spirometry and flutter valve. Baricitinib  off label use -patient himself too confused to comprehend benefits and risks of this medication and thus he is not in a position to provide any consent.  Patient's wife was told that if COVID-19 pneumonitis gets worse we might potentially use Actemra/ barititinib off label, she told me that patient has no known history of active diverticulitis, tuberculosis or hepatitis, understands the risks and benefits and wants to proceed with Actemra/barititinib and  thus I will start him on baricitinib. The treatment plan and use of medications and known side effects were discussed with patient/family, they  were clearly explained that there is no proven definitive treatment for COVID-19 infection, any medications used here are based on published clinical articles/anecdotal data which are not peer-reviewed or randomized control trials.  Complete risks and long-term side effects are unknown, however in the best clinical judgment they seem to be of some clinical benefit rather than medical risks.  Patient/family agree with the treatment plan and want to receive the given medications. -9/22 As pt is still requiring high amount of oxygen and NR we will continue to provide him with supplemental  Oxygen, and steroids. Will start pt on empiric t/t of PE due to elevated fibrin level. We will also start pt on anticoagulation to prevent any vte's.  -9/23 Pt seen today and after starting him on Lovenox his hemoglobin dropped , but pt also had been started on ivf , we will assess if there is any further drops or active bleeding.  lovenox is d/c and pt is started on heparin q8h for dvt prophylaxis. With IVF pt has clinically improved.  -9/24 Pt is doing poorly today have contacted wife and explained to her about volume management and difficulty with diuresis and low bp and hypotension and lactic acidosis. Cardiology consulted [facility] following patient . We will give lasix and move pt to PCU. 9/25 Pt is alert with sitter at bedside and is on NR and and [place].  Pt has guarded prognosis as he is in respiratory failrue due to combined covid pneumonia and chf with reduced EF. Have d/w wife that since past few days  Pt has not  been responding to diuretic as far as respiratory failure worsening.  Have d/w wife to come and be with pt and also in my opinion we would be doing pt harm more than good if he were to be put thru a cardiac arrest.    Chronic systolic congestive heart failure:  Patient's LVEF is 30 to 35% per echo done previously.  He was hypotensive in the ED and received 1 L of IV fluid.  Lasix were held because of  that.  Now his blood pressure is better.  We will give him 1 dose of Lasix 20 mg IV in effort to keep him dry and prevent fluid overload which is proved to be detrimental in COVID-19 patients.  Will reassess daily for further Lasix doses.  Continue aspirin and Brilinta.have requested cardiology consult for reduced ef chf and hypoperfusion. Appreciate cardiology managemtn. -lasix 2 mg iv x 2 doses. -Enalapril / or aldactone low dose once Hypotension is resolved.  -meds held as BP is low normal and pt is lasix.   Metabolic acidosis due to lactic acid level: ? If this is infectious or noninfectious etiology. We will treat empirically with iv abx and vancomycin and zosyn.   we will start pt on Lovenox and monitor.  ABG is pending.  Nephrology consult.  attribute to perfusion issue with decreased ef CHF. abx d/c. Cautious diuretic therapy.   Anemia: Pt has stable hemoglobin.  We will cont iv ppi and type /screen / transfuse if needed. Iron studies show anemic of chronic disease. Hb is 11.8.   DVT prophylaxis: SCD;d/ heparin 5000 subcut q 8.  Code Status:  Full code. Family Communication: None at bedside. Disposition Plan: TBD.  Status is: Inpatient Dispo: The patient is from: Home  Anticipated d/c is to: SNF              Anticipated d/c date is: about 5 days.              Patient currently is not medically stable to d/c.   Consultants:  Cardiology; Dr. Clayborn Bigness.   Subjective: Pt is alert and answers questions but is sleepy. Vitals are stable but his lactic acid has been elevated and has remained elevated since yesterday. D/D include Sepsis/ Ischemia. Pt has been admitted for covid PNA and is on HFNC at 15L.Intermittenyl has been hypoxic , will dw/ Dr. Mortimer Fries if pt can be transferred to ICU for worsening covid-19 related respiratory failure.   9/23 Pt is alert awake and oriented and eating breakfast.  Reviewed cardiology note and will request nephrology consult.    9/24 pt is alert and oriented and denies any complaints.  Pt has been hypotensive and in CHF. 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   9/25 Pt seen for f/u he is alert but not his usual self.   Objective: Vitals:   02/14/20 1425 02/14/20 1426 02/14/20 1427 02/14/20 1528  BP:  96/70  93/69  Pulse: 91 (!) 103 84   Resp: 11 16 13    Temp:      TempSrc:      SpO2: 99% 98% 98%   Weight:      Height:        Intake/Output Summary (Last 24 hours) at 02/14/2020 1535 Last data filed at 02/14/2020 0429 Gross per 24 hour  Intake 252.4 ml  Output 3200 ml  Net -2947.6 ml   Filed Weights   02/11/2020 1348 02/12/20 1731  Weight: 77.1 kg 72.2 kg    Examination: Blood pressure 93/69, pulse 84, temperature (!) 97.2 F (36.2 C), resp. rate 13, height 5\' 11"  (1.803 m), weight 72.2 kg, SpO2 98 %. General exam: Appears calm and comfortable  Respiratory system: Clear to auscultation. Respiratory effort normal. Cardiovascular system:  RRR. No JVD, murmurs, rubs, gallops or clicks. No pedal edema. Gastrointestinal system: Abdomen is nondistended, soft and nontender. No organomegaly or masses felt. Normal bowel sounds heard. Central nervous system: Alert and oriented. No focal neurological deficits. Extremities: pt moving all ext but slowly  Skin: No rashes, lesions or ulcers Psychiatry: Judgement and insight appear normal. Mood & affect appropriate.   Data Reviewed: I have personally reviewed following labs and imaging studies  I/O last 3 completed shifts: In: 1774.8 [P.O.:360; I.V.:1002.1; IV Piggyback:412.6] Out: 3725 [Urine:3725] No intake/output data recorded. Lab Results  Component Value Date   CREATININE 0.97 02/14/2020   CREATININE 0.97 02/13/2020   CREATININE 1.07 02/12/2020   CBC: Recent Labs  Lab 02/10/20 0520 02/10/20 0520 02/11/20 0654 02/11/20 0654 02/12/20 0543 02/12/20 1323 02/13/20 0506 02/13/20 1303  02/13/20 2058 02/14/20 0426 02/14/20 1307  WBC 10.7*  --  8.2  --  4.9  --  9.8  --   --  10.4  --   NEUTROABS 8.8*  --  6.9  --  4.1  --  8.8*  --   --  9.5*  --   HGB 14.9   < > 15.7   < > 12.5*   < > 12.3* 13.3 12.9* 12.3* 11.8*  HCT 44.0   < > 47.8   < > 36.5*   < > 35.4* 40.7 38.2* 35.5* 33.3*  MCV 83.5  --  86.0  --  83.1  --  84.3  --   --  82.6  --   PLT 155  --  135*  --  122*  --  142*  --   --  174  --    < > = values in this interval not displayed.   Basic Metabolic Panel: Recent Labs  Lab 02/10/20 0520 02/10/20 0520 02/11/20 0654 02/11/20 2332 02/12/20 0543 02/13/20 0506 02/14/20 0426  NA 143   < > 139 139 140 142 143  K 5.4*   < > 3.4* 3.7 4.0 4.2 3.6  CL 107   < > 105 106 106 108 107  CO2 24   < > 19* 21* 25 23 25   GLUCOSE 127*   < > 107* 134* 175* 164* 109*  BUN 35*   < > 37* 35* 35* 36* 37*  CREATININE 1.14   < > 0.98 1.08 1.07 0.97 0.97  CALCIUM 8.9   < > 8.6* 8.1* 8.1* 8.1* 8.2*  MG 2.8*   < > 2.4 2.4 2.6* 2.6* 2.4  PHOS 4.6  --  3.4  --  3.6 2.7 2.4*   < > = values in this interval not displayed.   GFR: Estimated Creatinine Clearance: 67.2 mL/min (by C-G formula based on SCr of 0.97 mg/dL). Liver Function Tests: Recent Labs  Lab 02/10/20 0520 02/11/20 0654 02/12/20 0543 02/13/20 0506 02/14/20 0426  AST 67* 54* 56* 83* 134*  ALT 35 37 38 40 54*  ALKPHOS 120 116 104 269* 303*  BILITOT 2.0* 2.4* 2.7* 2.5* 3.6*  PROT 6.6 6.4* 5.6* 6.0* 6.4*  ALBUMIN 2.9* 2.7* 2.3* 2.3* 2.5*   Lipid Profile: No results for input(s): CHOL, HDL, LDLCALC, TRIG, CHOLHDL, LDLDIRECT in the last 72 hours. Thyroid Function Tests: Recent Labs    02/12/20 0543  TSH 1.025  FREET4 1.02   Anemia Panel: Recent Labs    02/12/20 0543 02/12/20 0543 02/13/20 0506 02/14/20 0426  VEHMCNOB09 2,509*  --   --   --   FOLATE 8.3  --   --   --   FERRITIN 1,012*   < > 1,376* 2,708*  TIBC 136*  --   --   --   IRON 28*  --   --   --   RETICCTPCT 1.1  --   --   --    < > =  values in this interval not displayed.   Sepsis Labs: Recent Labs  Lab 02/10/2020 1347 02/16/2020 1405 02/11/20 2332 02/12/20 0543 02/13/20 0951 02/13/20 1303  PROCALCITON 0.18  --   --   --   --   --   LATICACIDVEN  --    < > 3.9* 2.3* 2.9* 3.2*   < > = values in this interval not displayed.    Recent Results (from the past 240 hour(s))  SARS Coronavirus 2 by RT PCR (hospital order, performed in [facility] hospital lab) Nasopharyngeal Nasopharyngeal Swab     Status: Abnormal   Collection Time: 02/19/2020  2:05 PM   Specimen: Nasopharyngeal Swab  Result Value Ref Range Status   SARS Coronavirus 2 POSITIVE (A) NEGATIVE Final    Comment: RESULT CALLED TO, READ BACK BY AND VERIFIED WITH: AMY COWEN 02/18/2020 AT 1534 BY ACR (NOTE) SARS-CoV-2 target nucleic acids are DETECTED  SARS-CoV-2 RNA is generally detectable in upper respiratory specimens  during the acute phase of infection.  Positive results are indicative  of the presence of the identified virus, but do not rule  out bacterial infection or co-infection with other pathogens not detected by the test.  Clinical correlation with patient history and  other diagnostic information is necessary to determine patient infection status.  The expected result is negative.  Fact Sheet for Patients:   StrictlyIdeas.no   Fact Sheet for Healthcare Providers:   BankingDealers.co.za    This test is not yet approved or cleared by the [place] FDA and  has been authorized for detection and/or diagnosis of SARS-CoV-2 by FDA under an Emergency Use Authorization (EUA).  This EUA will remain in effect (meaning this test  can be used) for the duration of  the COVID-19 declaration under Section 564(b)(1) of the Act, 21 U.S.C. section 360-bbb-3(b)(1), unless the authorization is terminated or revoked sooner.  Performed at [facility], [address] [place], [place] [address]   Blood  Culture (routine x 2)     Status: None   Collection Time: 02/01/2020  2:20 PM   Specimen: BLOOD  Result Value Ref Range Status   Specimen Description BLOOD BLOOD LEFT ARM  Final   Special Requests   Final    BOTTLES DRAWN AEROBIC AND ANAEROBIC Blood Culture adequate volume   Culture   Final    NO GROWTH 5 DAYS Performed at [facility], [address] [place], [place] [address]    Report Status 02/14/2020 FINAL  Final  Blood Culture (routine x 2)     Status: None   Collection Time: 02/19/2020  2:28 PM   Specimen: BLOOD  Result Value Ref Range Status   Specimen Description BLOOD RIGHT ANTECUBITAL  Final   Special Requests   Final    BOTTLES DRAWN AEROBIC AND ANAEROBIC Blood Culture adequate volume   Culture   Final    NO GROWTH 5 DAYS Performed at [facility], [address], [place], [place] [address]    Report Status 02/14/2020 FINAL  Final  Culture, blood (Routine X 2) w Reflex to ID Panel     Status: Abnormal   Collection Time: 02/11/20  2:19 PM   Specimen: BLOOD  Result Value Ref Range Status   Specimen Description   Final    BLOOD LEFT ANTECUBITAL Performed at [facility], [address] [place], [place] [address]    Special Requests   Final    BOTTLES DRAWN AEROBIC AND ANAEROBIC Blood Culture adequate volume Performed at [facility], [address] [place], [place] [address]    Culture  Setup Time   Final    AEROBIC BOTTLE ONLY GRAM POSITIVE RODS CRITICAL RESULT CALLED TO, READ BACK BY AND VERIFIED WITH: [place] @1915  02/12/20 MJU Performed at [facility], [address] [place], [place] [address]    Culture (A)  Final    DIPHTHEROIDS(CORYNEBACTERIUM SPECIES) Standardized susceptibility testing for this organism is not available. Performed at [facility], [place] [address], [place], [place] [address]    Report Status 02/14/2020 FINAL  Final  Culture, blood (Routine X 2) w Reflex to ID Panel     Status:  Abnormal (Preliminary result)   Collection Time: 02/11/20  2:24 PM   Specimen: BLOOD  Result Value Ref Range Status   Specimen Description   Final    BLOOD BLOOD LEFT HAND Performed at [facility], [address], [place],  [address]    Special Requests   Final    BOTTLES DRAWN AEROBIC AND ANAEROBIC Blood Culture adequate volume Performed at [facility],  [place], [place] [address]    Culture  Setup Time   Final    GRAM POSITIVE COCCI AEROBIC BOTTLE ONLY Organism ID to follow CRITICAL RESULT CALLED TO, READ BACK BY AND VERIFIED WITH: JASON ROBBINS AT 3762 ON 02/12/2020 [place]. Performed at [facility], [address] [place], [place] [address]    Culture VIRIDANS STREPTOCOCCUS (A)  Final   Report Status PENDING  Incomplete  Blood Culture ID Panel (Reflexed)     Status: Abnormal   Collection Time: 02/11/20  2:24 PM  Result Value Ref Range Status   Enterococcus faecalis NOT DETECTED NOT DETECTED Final   Enterococcus Faecium NOT DETECTED NOT DETECTED Final   Listeria monocytogenes NOT DETECTED NOT DETECTED Final   Staphylococcus species NOT DETECTED NOT DETECTED Final   Staphylococcus aureus (BCID) NOT DETECTED NOT DETECTED Final   Staphylococcus epidermidis NOT DETECTED NOT DETECTED Final   Staphylococcus lugdunensis NOT DETECTED NOT DETECTED Final   Streptococcus species DETECTED (A) NOT DETECTED Final    Comment: Not Enterococcus species, Streptococcus agalactiae, Streptococcus pyogenes, or Streptococcus pneumoniae. CRITICAL RESULT CALLED TO, READ BACK BY AND VERIFIED WITH: JASON ROBBINS AT 0607 ON 02/12/2020 [place].    Streptococcus agalactiae NOT DETECTED NOT DETECTED Final   Streptococcus pneumoniae NOT DETECTED NOT DETECTED Final   Streptococcus pyogenes NOT DETECTED NOT DETECTED Final   A.calcoaceticus-baumannii NOT DETECTED NOT DETECTED Final   Bacteroides fragilis NOT DETECTED NOT DETECTED Final   Enterobacterales NOT  DETECTED NOT DETECTED Final   Enterobacter cloacae complex NOT DETECTED NOT DETECTED Final   Escherichia coli NOT DETECTED NOT DETECTED Final   Klebsiella aerogenes NOT DETECTED NOT DETECTED Final   Klebsiella oxytoca NOT DETECTED NOT DETECTED Final   Klebsiella pneumoniae NOT DETECTED NOT DETECTED Final   Proteus species NOT DETECTED NOT DETECTED Final   Salmonella species NOT DETECTED NOT DETECTED Final   Serratia marcescens NOT DETECTED NOT DETECTED Final   Haemophilus influenzae NOT DETECTED NOT DETECTED Final   Neisseria meningitidis NOT DETECTED NOT DETECTED Final   Pseudomonas aeruginosa NOT DETECTED NOT DETECTED Final   Stenotrophomonas maltophilia NOT DETECTED NOT DETECTED Final   Candida albicans NOT DETECTED NOT DETECTED Final   Candida auris NOT DETECTED NOT DETECTED Final   Candida glabrata NOT DETECTED NOT DETECTED Final   Candida krusei NOT DETECTED NOT DETECTED Final   Candida parapsilosis NOT DETECTED NOT DETECTED Final   Candida tropicalis NOT DETECTED NOT DETECTED Final   Cryptococcus neoformans/gattii NOT DETECTED NOT DETECTED Final    Comment: Performed at [facility], [address] [place], [place] [address]  Culture, blood (Routine X 2) w Reflex to ID Panel     Status: None (Preliminary result)   Collection Time: 02/13/20  9:51 AM   Specimen: BLOOD  Result Value Ref Range Status   Specimen Description BLOOD [facility]  Final   Special Requests BOTTLES DRAWN AEROBIC ONLY [place]  Final   Culture   Final    NO GROWTH < 24 HOURS Performed at [facility], [address] [place], [place] [address]    Report Status PENDING  Incomplete  Culture, blood (Routine X 2) w Reflex to ID Panel     Status: None (Preliminary result)   Collection Time: 02/13/20  1:02 PM   Specimen: BLOOD  Result Value Ref Range Status   Specimen Description BLOOD BLOOD RIGHT HAND  Final   Special Requests   Final    BOTTLES DRAWN AEROBIC AND ANAEROBIC Blood  Culture results may  not be optimal due to an inadequate volume of blood received in culture bottles   Culture   Final    NO GROWTH < 24 HOURS Performed at [facility], [address] [place], [place] [address]    Report Status PENDING  Incomplete     Radiology Studies: DG Chest 1 View  Result Date: 02/13/2020 CLINICAL DATA:  CHF, COVID positive, coronary disease, smoker EXAM: CHEST  1 VIEW COMPARISON:  02/11/2020 FINDINGS: Worsening diffuse mixed interstitial and airspace opacities throughout both lungs. Stable mild cardiomegaly. No enlarging effusion or pneumothorax. Trachea midline. Aorta atherosclerotic and degenerative changes of the spine. IMPRESSION: Worsening diffuse bilateral pneumonia pattern Electronically Signed   By: Jerilynn Mages.  Shick M.D.   On: 02/13/2020 08:27   Scheduled Meds: . albuterol  2 puff Inhalation Q6H  . vitamin C  500 mg Oral Daily  . aspirin EC  81 mg Oral Daily  . Chlorhexidine Gluconate Cloth  6 each Topical Q0600  . feeding supplement (ENSURE ENLIVE)  237 mL Oral TID BM  . furosemide  20 mg Intravenous Q12H  . heparin injection (subcutaneous)  5,000 Units Subcutaneous Q8H  . midodrine  2.5 mg Oral TID WC  . multivitamin with minerals  1 tablet Oral Daily  . predniSONE  50 mg Oral Daily  . sodium chloride flush  3 mL Intravenous Q12H  . ticagrelor  90 mg Oral BID  . zinc sulfate  220 mg Oral Daily   Continuous Infusions: . ampicillin-sulbactam (UNASYN) IV 3 g (02/14/20 1249)     LOS: 5 days    Para Skeans, MD [facility] Pager [phone] If 7PM-7AM, please contact night-coverage [email or web address] Password [facility] 02/14/2020, 3:35 PM

## 2020-02-15 ENCOUNTER — Inpatient Hospital Stay: Payer: Medicare HMO

## 2020-02-15 LAB — BASIC METABOLIC PANEL
Anion gap: 13 (ref 5–15)
BUN: 36 mg/dL — ABNORMAL HIGH (ref 8–23)
CO2: 25 mmol/L (ref 22–32)
Calcium: 8 mg/dL — ABNORMAL LOW (ref 8.9–10.3)
Chloride: 103 mmol/L (ref 98–111)
Creatinine, Ser: 0.97 mg/dL (ref 0.61–1.24)
GFR calc Af Amer: >60 mL/min (ref >60)
GFR calc non Af Amer: >60 mL/min (ref >60)
Glucose, Bld: 105 mg/dL — ABNORMAL HIGH (ref 70–99)
Potassium: 3.5 mmol/L (ref 3.5–5.1)
Sodium: 141 mmol/L (ref 135–145)

## 2020-02-15 LAB — MAGNESIUM: Magnesium: 2.3 mg/dL (ref 1.7–2.4)

## 2020-02-15 LAB — CULTURE, BLOOD (ROUTINE X 2): Special Requests: ADEQUATE

## 2020-02-15 LAB — FERRITIN: Ferritin: 1783 ng/mL — ABNORMAL HIGH (ref 24–336)

## 2020-02-15 LAB — HEMOGLOBIN AND HEMATOCRIT, BLOOD
HCT: 30.8 % — ABNORMAL LOW (ref 39.0–52.0)
HCT: 30 % — ABNORMAL LOW (ref 39.0–52.0)
HCT: 29.5 % — ABNORMAL LOW (ref 39.0–52.0)
Hemoglobin: 11 g/dL — ABNORMAL LOW (ref 13.0–17.0)
Hemoglobin: 10.1 g/dL — ABNORMAL LOW (ref 13.0–17.0)
Hemoglobin: 10 g/dL — ABNORMAL LOW (ref 13.0–17.0)

## 2020-02-15 LAB — PHOSPHORUS: Phosphorus: 2.2 mg/dL — ABNORMAL LOW (ref 2.5–4.6)

## 2020-02-15 LAB — CBC
HCT: 31.5 % — ABNORMAL LOW (ref 39.0–52.0)
Hemoglobin: 10.7 g/dL — ABNORMAL LOW (ref 13.0–17.0)
MCH: 29 pg (ref 26.0–34.0)
MCHC: 34 g/dL (ref 30.0–36.0)
MCV: 85.4 fL (ref 80.0–100.0)
Platelets: 151 10*3/uL (ref 150–400)
RBC: 3.69 MIL/uL — ABNORMAL LOW (ref 4.22–5.81)
RDW: 16.6 % — ABNORMAL HIGH (ref 11.5–15.5)
WBC: 12.2 10*3/uL — ABNORMAL HIGH (ref 4.0–10.5)
nRBC: 0 % (ref 0.0–0.2)

## 2020-02-15 LAB — LACTATE DEHYDROGENASE: LDH: 1474 U/L — ABNORMAL HIGH (ref 98–192)

## 2020-02-15 LAB — FIBRIN DERIVATIVES D-DIMER (ARMC ONLY): Fibrin derivatives D-dimer (ARMC): 4778.03 ng/mL (FEU) — ABNORMAL HIGH (ref 0.00–499.00)

## 2020-02-15 LAB — BRAIN NATRIURETIC PEPTIDE: B Natriuretic Peptide: 1827.8 pg/mL — ABNORMAL HIGH (ref 0.0–100.0)

## 2020-02-15 LAB — C-REACTIVE PROTEIN: CRP: 12.3 mg/dL — ABNORMAL HIGH (ref <1.0)

## 2020-02-15 MED ORDER — K PHOS MONO-SOD PHOS DI & MONO 155-852-130 MG PO TABS
500.0000 mg | ORAL_TABLET | ORAL | Status: AC
  Administered 2020-02-15 (×2): 500 mg via ORAL
  Filled 2020-02-15 (×2): qty 2

## 2020-02-15 MED ORDER — CHLORHEXIDINE GLUCONATE CLOTH 2 % EX PADS
6.0000 | MEDICATED_PAD | Freq: Every day | CUTANEOUS | Status: DC
  Administered 2020-02-15 – 2020-02-23 (×9): 6 via TOPICAL

## 2020-02-15 NOTE — Progress Notes (Signed)
Pt had a 8 beat run of Vtach around [phone] and a 25 beat run of Vtach at 1115. Pt asymptomatic during these events. MD aware. Will continue to monitor.

## 2020-02-15 NOTE — Progress Notes (Signed)
PROGRESS NOTE    Nicholas Schultz  JKD:[id number] DOB: [birth year] DOA: 02/10/2020 PCP: Patient, [name] Per   Brief Narrative:  Nicholas Schultz a 74 y.o.malewith medical history significant forcoronary artery disease, heart failure with reduced EF(last known LVEF 30 to 35%),history of ischemic cardiomyopathy, nicotine dependence who presents to the emergency room for evaluation of worsening shortness of breath. Patient was recently exposed to a family member who had COVID-19 infection and had a positive Covid PCR test about 10 days ago. He is unvaccinated.  His wife has been checking his pulse oximetry at home and today noted that his pulse oximetry had been about 86% which prompted patient's visit to the emergency room for further evaluation. He complains of a fever and chills as well as myalgias and weakness but denies having any cough. He has nausea but no vomiting. He denies having any chest pain, no abdominal pain, no dizziness, no lightheadedness, no urinary symptoms or changes in his bowel habits. Labs show sodium 139, potassium 3.9, chloride 103, bicarb 21, BUN 101, BUN 35, creatinine 1.35, calcium 8.4, alkaline phosphatase 129, ALT 35, AST 80, total protein 6.6, LDH1100,troponin38,ferritin3623, lactic acid4.0,procalcitonin 0.18,white count 12.1, hemoglobin 15.1, hematocrit 44.5, MCV 84.1, RDW 16.8, fibrin derivatives 7,5000 Chest x-ray reviewed by me shows bilateral interstitial opacities. Lead EKG shows sinus tachycardia, PVCs and left bundle branch block  Patient is a 74 year old male with a history of ischemic cardiomyopathy, chronic systolic heart failure with last known LVEF of 30 to 35% who presents to the emergency room via EMS for evaluation of hypoxia. Patient had a positive Covid test about 10 days ago and has been checking his pulse oximetry at home.Patient noted to have room air pulse oximetry of 86% prompting a visit to the emergency room. Patient has been  hypotensive with systolic blood pressure in the 80s but is awake and alert. He received 1 L IV fluid hydration in the ER with improvement in his blood pressure. He also received remdesivir and prednisone. He is currently on high flow oxygen and will be admitted to the hospital for further evaluation.  Assessment & Plan:   Principal Problem:   Acute respiratory failure due to COVID-19 [facility]) Active Problems:   Chronic systolic heart failure (HCC)   Lactic acidosis   Goals of care, counseling/discussion   Palliative care by specialist   Protein-calorie malnutrition, severe Acute hypoxic respiratory failure and severe sepsis secondary to COVID-19 pneumonia:  Patient meets severe sepsis criteria based on tachycardia, tachypnea, leukocytosis, hypoxia and lactic acid of 4.0.  Patient was requiring up to 13 L of high flow oxygen up until last night but this morning, he states that he was feeling better and currently he is on 6 L of high flow.  Significantly elevated D-dimer.  CT angiogram negative for PE.  Slight improvement in inflammatory markers otherwise.  Continue to wean oxygen.  Repeat lactic acid.  Continue following Remdesivir per pharmacy protocol IV Solu-Medrol Bronchodilator, antitussive.  Patient was encouraged to prone, out of bed to chair, to use incentive spirometry and flutter valve. Baricitinib  off label use -patient himself too confused to comprehend benefits and risks of this medication and thus he is not in a position to provide any consent.  Patient's wife was told that if COVID-19 pneumonitis gets worse we might potentially use Actemra/ barititinib off label, she told me that patient has no known history of active diverticulitis, tuberculosis or hepatitis, understands the risks and benefits and wants to proceed with Actemra/barititinib and  thus I will start him on baricitinib. The treatment plan and use of medications and known side effects were discussed with patient/family, they  were clearly explained that there is no proven definitive treatment for COVID-19 infection, any medications used here are based on published clinical articles/anecdotal data which are not peer-reviewed or randomized control trials.  Complete risks and long-term side effects are unknown, however in the best clinical judgment they seem to be of some clinical benefit rather than medical risks.  Patient/family agree with the treatment plan and want to receive the given medications. -9/22 As pt is still requiring high amount of oxygen and NR we will continue to provide him with supplemental  Oxygen, and steroids. Will start pt on empiric t/t of PE due to elevated fibrin level. We will also start pt on anticoagulation to prevent any vte's.  -9/23 Pt seen today and after starting him on Lovenox his hemoglobin dropped , but pt also had been started on ivf , we will assess if there is any further drops or active bleeding.  lovenox is d/c and pt is started on heparin q8h for dvt prophylaxis. With IVF pt has clinically improved.  -9/24 Pt is doing poorly today have contacted wife and explained to her about volume management and difficulty with diuresis and low bp and hypotension and lactic acidosis. Cardiology consulted [place] following patient . We will give lasix and move pt to PCU. 9/25 Pt is alert with sitter at bedside and is on NR and and [place].  Pt has guarded prognosis as he is in respiratory failrue due to combined covid pneumonia and chf with reduced EF. Have d/w wife that since past few days  Pt has not  been responding to diuretic as far as respiratory failure worsening.  Have d/w wife to come and be with pt and also in my opinion we would be doing pt harm more than good if he were to be put thru a cardiac arrest.  9/26 Patient seen today he is very somnolent currently on nasal cannula and nonrebreather with O2 sats in the high 90s.  Patient has mittens in both hands to prevent him from harm removing  his nasal cannula.  He also has a Actuary at bedside.  I saw wife yesterday and discussed with her about his current status and respiratory failure from the Covid pneumonia and decreased ejection fraction congestive heart failure.  Patient is very frail and is not a good candidate for resuscitation in case of cardiac arrest I discussed this with his wife yesterday Ms. Neoma Laming.  Family has been visiting patient as he is doing poorly.  Patient is already on IV antibiotics and supplemental oxygen.  We will repeat a chest x-ray.  BNP for volume assessment is 1827.  Diuretic therapy with Lasix I have intermittently started patient on it with parameters to prevent hypotension.  Labs today show normal creatinine of 0.97.  LDH of 1474, ferritin of 1783, and CBC shows a white count of 12.2, hemoglobin of 10.7.   Chronic systolic congestive heart failure:  Patient's LVEF is 30 to 35% per echo done previously.  He was hypotensive in the ED and received 1 L of IV fluid.  Lasix were held because of that.  Now his blood pressure is better.  We will give him 1 dose of Lasix 20 mg IV in effort to keep him dry and prevent fluid overload which is proved to be detrimental in COVID-19 patients.  Will reassess daily for further  Lasix doses.  Continue aspirin and Brilinta.have requested cardiology consult for reduced ef chf and hypoperfusion. Appreciate cardiology managemtn. -lasix 2 mg iv x 2 doses. -Enalapril / or aldactone low dose once Hypotension is resolved.  -meds held as BP is low normal and pt is lasix.  Patient is currently maintained on nonrebreather and high flow oxygen. Cautious diuretic therapy intermittently to prevent hypotension and precipitation of AKI.  Metabolic acidosis due to lactic acid level: ? If this is infectious or noninfectious etiology. We will treat empirically with iv abx and vancomycin and zosyn.   we will start pt on Lovenox and monitor.  ABG is pending.  Nephrology consult.  attribute to  perfusion issue with decreased ef CHF. abx d/c. Cautious diuretic therapy.  Notable acidosis attributed to lactic acidosis secondary to hypoperfusion due to reduced ejection fraction congestive heart failure.  Anemia: Pt has stable hemoglobin.  We will cont iv ppi and type /screen / transfuse if needed. Iron studies show anemic of chronic disease. Hb is 11.8.  H&H is 10.7 today.   DVT prophylaxis: SCD;d/ heparin 5000 subcut q 8.  Code Status:  Full code. Family Communication: None at bedside. Disposition Plan: TBD.  Status is: Inpatient Dispo: The patient is from: Home              Anticipated d/c is to: SNF              Anticipated d/c date is: about 5 days.              Patient currently is not medically stable to d/c.   Consultants:  Cardiology; Dr. Clayborn Bigness.   Subjective: Pt is alert and answers questions but is sleepy. Vitals are stable but his lactic acid has been elevated and has remained elevated since yesterday. D/D include Sepsis/ Ischemia. Pt has been admitted for covid PNA and is on HFNC at 15L.Intermittenyl has been hypoxic , will dw/ Dr. Mortimer Fries if pt can be transferred to ICU for worsening covid-19 related respiratory failure.   9/23 Pt is alert awake and oriented and eating breakfast.  Reviewed cardiology note and will request nephrology consult.   9/24 pt is alert and oriented and denies any complaints.  Pt has been hypotensive and in CHF. SpO2: (!) 89 % O2 Flow Rate (L/min): 15 L/min Due to intermittent agitation he has a Actuary.   9/25 Pt seen for f/u he is alert but not his usual self.   Objective: Vitals:   02/15/20 0402 02/15/20 0618 02/15/20 0747 02/15/20 1154  BP: 103/69     Pulse: 86 99    Resp: 16 19    Temp: 97.8 F (36.6 C)  (!) 97.5 F (36.4 C) 97.7 F (36.5 C)  TempSrc: Axillary  Axillary Axillary  SpO2: 94% (!) 89%    Weight: 69 kg     Height:        Intake/Output Summary (Last 24 hours) at 02/15/2020 1431 Last data filed at  02/15/2020 0938 Gross per 24 hour  Intake 3 ml  Output 2600 ml  Net -2597 ml   Filed Weights   02/13/2020 1348 02/12/20 1731 02/15/20 0402  Weight: 77.1 kg 72.2 kg 69 kg    Examination: Blood pressure 103/69, pulse 99, temperature 97.7 F (36.5 C), temperature source Axillary, resp. rate 19, height 5\' 11"  (1.803 m), weight 69 kg, SpO2 (!) 89 %. General exam: Appears calm and comfortable  Respiratory system: Clear to auscultation. Respiratory effort normal. Cardiovascular system:  RRR.  No JVD, murmurs, rubs, gallops or clicks. No pedal edema. Gastrointestinal system: Abdomen is nondistended, soft and nontender. No organomegaly or masses felt. Normal bowel sounds heard. Central nervous system: Alert and oriented. No focal neurological deficits. Extremities: pt moving all ext but slowly  Skin: No rashes, lesions or ulcers Psychiatry: Judgement and insight appear normal. Mood & affect appropriate.   Data Reviewed: I have personally reviewed following labs and imaging studies  I/O last 3 completed shifts: In: -  Out: 4300 [Urine:4300] Total I/O In: 3 [I.V.:3] Out: -  Lab Results  Component Value Date   CREATININE 0.97 02/15/2020   CREATININE 0.97 02/14/2020   CREATININE 0.97 02/13/2020   CBC: Recent Labs  Lab 02/10/20 0520 02/10/20 0520 02/11/20 0654 02/11/20 0654 02/12/20 0543 02/12/20 1323 02/13/20 0506 02/13/20 1303 02/14/20 0426 02/14/20 1307 02/14/20 2053 02/15/20 0428 02/15/20 1249  WBC 10.7*   < > 8.2  --  4.9  --  9.8  --  10.4  --   --  12.2*  --   NEUTROABS 8.8*  --  6.9  --  4.1  --  8.8*  --  9.5*  --   --   --   --   HGB 14.9   < > 15.7   < > 12.5*   < > 12.3*   < > 12.3* 11.8* 11.5* 10.7*   11.0* 10.1*  HCT 44.0   < > 47.8   < > 36.5*   < > 35.4*   < > 35.5* 33.3* 32.5* 31.5*   30.8* 30.0*  MCV 83.5   < > 86.0  --  83.1  --  84.3  --  82.6  --   --  85.4  --   PLT 155   < > 135*  --  122*  --  142*  --  174  --   --  151  --    < > = values in this  interval not displayed.   Basic Metabolic Panel: Recent Labs  Lab 02/11/20 0654 02/11/20 0654 02/11/20 2332 02/11/20 2332 02/12/20 0543 02/13/20 0506 02/14/20 0426 02/14/20 1835 02/15/20 0428  NA 139   < > 139   < > 140 142 143 141 141  K 3.4*   < > 3.7  --  4.0 4.2 3.6  --  3.5  CL 105   < > 106  --  106 108 107  --  103  CO2 19*   < > 21*  --  25 23 25   --  25  GLUCOSE 107*   < > 134*  --  175* 164* 109*  --  105*  BUN 37*   < > 35*  --  35* 36* 37*  --  36*  CREATININE 0.98   < > 1.08  --  1.07 0.97 0.97  --  0.97  CALCIUM 8.6*   < > 8.1*  --  8.1* 8.1* 8.2*  --  8.0*  MG 2.4   < > 2.4   < > 2.6* 2.6* 2.4 2.2 2.3  PHOS 3.4  --   --   --  3.6 2.7 2.4*  --  2.2*   < > = values in this interval not displayed.   GFR: Estimated Creatinine Clearance: 64.2 mL/min (by C-G formula based on SCr of 0.97 mg/dL). Liver Function Tests: Recent Labs  Lab 02/10/20 0520 02/11/20 0654 02/12/20 0543 02/13/20 0506 02/14/20 0426  AST 67* 54* 56* 83* 134*  ALT 35 37 38 40 54*  ALKPHOS 120 116 104 269* 303*  BILITOT 2.0* 2.4* 2.7* 2.5* 3.6*  PROT 6.6 6.4* 5.6* 6.0* 6.4*  ALBUMIN 2.9* 2.7* 2.3* 2.3* 2.5*   Lipid Profile: No results for input(s): CHOL, HDL, LDLCALC, TRIG, CHOLHDL, LDLDIRECT in the last 72 hours. Thyroid Function Tests: No results for input(s): TSH, T4TOTAL, FREET4, T3FREE, THYROIDAB in the last 72 hours. Anemia Panel: Recent Labs    02/14/20 0426 02/15/20 0428  FERRITIN 2,708* 1,783*   Sepsis Labs: Recent Labs  Lab 01/27/2020 1347 02/14/2020 1405 02/11/20 2332 02/12/20 0543 02/13/20 0951 02/13/20 1303  PROCALCITON 0.18  --   --   --   --   --   LATICACIDVEN  --    < > 3.9* 2.3* 2.9* 3.2*   < > = values in this interval not displayed.    Recent Results (from the past 240 hour(s))  SARS Coronavirus 2 by RT PCR (hospital order, performed in [facility] hospital lab) Nasopharyngeal Nasopharyngeal Swab     Status: Abnormal   Collection Time: 01/27/2020  2:05 PM    Specimen: Nasopharyngeal Swab  Result Value [place] Status   SARS Coronavirus 2 POSITIVE (A) NEGATIVE Final    Comment: RESULT CALLED TO, READ BACK BY AND VERIFIED WITH: AMY COWEN 02/07/2020 AT 1534 BY ACR (NOTE) SARS-CoV-2 target nucleic acids are DETECTED  SARS-CoV-2 RNA is generally detectable in upper respiratory specimens  during the acute phase of infection.  Positive results are indicative  of the presence of the identified virus, but do not rule out bacterial infection or co-infection with other pathogens not detected by the test.  Clinical correlation with patient history and  other diagnostic information is necessary to determine patient infection status.  The expected result is negative.  Fact Sheet for Patients:   StrictlyIdeas.no   Fact Sheet for Healthcare Providers:   BankingDealers.co.za    This test is not yet approved or cleared by the [place] FDA and  has been authorized for detection and/or diagnosis of SARS-CoV-2 by FDA under an Emergency Use Authorization (EUA).  This EUA will remain in effect (meaning this test  can be used) for the duration of  the COVID-19 declaration under Section 564(b)(1) of the Act, 21 U.S.C. section 360-bbb-3(b)(1), unless the authorization is terminated or revoked sooner.  Performed at [facility], [address] [place], [place] [address]   Blood Culture (routine x 2)     Status: None   Collection Time: 02/11/2020  2:20 PM   Specimen: BLOOD  Result Value [place] Status   Specimen Description BLOOD BLOOD LEFT ARM  Final   Special Requests   Final    BOTTLES DRAWN AEROBIC AND ANAEROBIC Blood Culture adequate volume   Culture   Final    NO GROWTH 5 DAYS Performed at [facility], [address] [place], [place] [address]    Report Status 02/14/2020 FINAL  Final  Blood Culture (routine x 2)     Status: None   Collection Time: 02/10/2020  2:28 PM    Specimen: BLOOD  Result Value [place] Status   Specimen Description BLOOD RIGHT ANTECUBITAL  Final   Special Requests   Final    BOTTLES DRAWN AEROBIC AND ANAEROBIC Blood Culture adequate volume   Culture   Final    NO GROWTH 5 DAYS Performed at [facility], [address], [place], [place] [address]    Report Status 02/14/2020 FINAL  Final  Culture, blood (Routine X 2) w Reflex to ID Panel     Status: Abnormal   Collection Time: 02/11/20  2:19 PM   Specimen: BLOOD  Result Value [place] Status   Specimen Description   Final    BLOOD LEFT ANTECUBITAL Performed at [facility], [address] [place], [place] [address]    Special Requests   Final    BOTTLES DRAWN AEROBIC AND ANAEROBIC Blood Culture adequate volume Performed at [facility], [address] [place], [place] [address]    Culture  Setup Time   Final    AEROBIC BOTTLE ONLY GRAM POSITIVE RODS CRITICAL RESULT CALLED TO, READ BACK BY AND VERIFIED WITH: [place] @1915  02/12/20 MJU Performed at [facility], [address] [place], [place] [address]    Culture (A)  Final    DIPHTHEROIDS(CORYNEBACTERIUM SPECIES) Standardized susceptibility testing for this organism is not available. Performed at [facility], [place] [address], [place], [place] [address]    Report Status 02/14/2020 FINAL  Final  Culture, blood (Routine X 2) w Reflex to ID Panel     Status: Abnormal   Collection Time: 02/11/20  2:24 PM   Specimen: BLOOD  Result Value [place] Status   Specimen Description   Final    BLOOD BLOOD LEFT HAND Performed at [facility], [address], [place], [place] [address]    Special Requests   Final    BOTTLES DRAWN AEROBIC AND ANAEROBIC Blood Culture adequate volume Performed at [facility], [address] [place], [place] [address]    Culture  Setup Time   Final    GRAM POSITIVE COCCI AEROBIC BOTTLE ONLY Organism ID to follow CRITICAL  RESULT CALLED TO, READ BACK BY AND VERIFIED WITH: JASON ROBBINS AT 5784 ON 02/12/2020 [place]. Performed at [facility], [address] [place], [place] [address]    Culture (A)  Final    VIRIDANS STREPTOCOCCUS THE SIGNIFICANCE OF ISOLATING THIS ORGANISM FROM A SINGLE SET OF BLOOD CULTURES WHEN MULTIPLE SETS ARE DRAWN IS UNCERTAIN. PLEASE NOTIFY THE MICROBIOLOGY DEPARTMENT WITHIN ONE WEEK IF SPECIATION AND SENSITIVITIES ARE REQUIRED. Performed at [facility], [place] [address], [place],  [address]    Report Status 02/15/2020 FINAL  Final  Blood Culture ID Panel (Reflexed)     Status: Abnormal   Collection Time: 02/11/20  2:24 PM  Result Value Ref [place] Status   Enterococcus faecalis NOT DETECTED NOT DETECTED Final   Enterococcus Faecium NOT DETECTED NOT DETECTED Final   Listeria monocytogenes NOT DETECTED NOT DETECTED Final   Staphylococcus species NOT DETECTED NOT DETECTED Final   Staphylococcus aureus (BCID) NOT DETECTED NOT DETECTED Final   Staphylococcus epidermidis NOT DETECTED NOT DETECTED Final   Staphylococcus lugdunensis NOT DETECTED NOT DETECTED Final   Streptococcus species DETECTED (A) NOT DETECTED Final    Comment: Not Enterococcus species, Streptococcus agalactiae, Streptococcus pyogenes, or Streptococcus pneumoniae. CRITICAL RESULT CALLED TO, READ BACK BY AND VERIFIED WITH: JASON ROBBINS AT 0607 ON 02/12/2020 [place].    Streptococcus agalactiae NOT DETECTED NOT DETECTED Final   Streptococcus pneumoniae NOT DETECTED NOT DETECTED Final   Streptococcus pyogenes NOT DETECTED NOT DETECTED Final   A.calcoaceticus-baumannii NOT DETECTED NOT DETECTED Final   Bacteroides fragilis NOT DETECTED NOT DETECTED Final   Enterobacterales NOT DETECTED NOT DETECTED Final   Enterobacter cloacae complex NOT DETECTED NOT DETECTED Final   Escherichia coli NOT DETECTED NOT DETECTED Final  Klebsiella aerogenes NOT DETECTED NOT DETECTED Final   Klebsiella oxytoca NOT DETECTED  NOT DETECTED Final   Klebsiella pneumoniae NOT DETECTED NOT DETECTED Final   Proteus species NOT DETECTED NOT DETECTED Final   Salmonella species NOT DETECTED NOT DETECTED Final   Serratia marcescens NOT DETECTED NOT DETECTED Final   Haemophilus influenzae NOT DETECTED NOT DETECTED Final   Neisseria meningitidis NOT DETECTED NOT DETECTED Final   Pseudomonas aeruginosa NOT DETECTED NOT DETECTED Final   Stenotrophomonas maltophilia NOT DETECTED NOT DETECTED Final   Candida albicans NOT DETECTED NOT DETECTED Final   Candida auris NOT DETECTED NOT DETECTED Final   Candida glabrata NOT DETECTED NOT DETECTED Final   Candida krusei NOT DETECTED NOT DETECTED Final   Candida parapsilosis NOT DETECTED NOT DETECTED Final   Candida tropicalis NOT DETECTED NOT DETECTED Final   Cryptococcus neoformans/gattii NOT DETECTED NOT DETECTED Final    Comment: Performed at [facility], [address] [place], [place] [address]  Culture, blood (Routine X 2) w Reflex to ID Panel     Status: None (Preliminary result)   Collection Time: 02/13/20  9:51 AM   Specimen: BLOOD  Result Value [place] Status   Specimen Description BLOOD [facility]  Final   Special Requests BOTTLES DRAWN AEROBIC ONLY [place]  Final   Culture   Final    NO GROWTH 2 DAYS Performed at [facility], [address] [place], [place] [address]    Report Status PENDING  Incomplete  Culture, blood (Routine X 2) w Reflex to ID Panel     Status: None (Preliminary result)   Collection Time: 02/13/20  1:02 PM   Specimen: BLOOD  Result Value Ref [place] Status   Specimen Description BLOOD BLOOD RIGHT HAND  Final   Special Requests   Final    BOTTLES DRAWN AEROBIC AND ANAEROBIC Blood Culture results may not be optimal due to an inadequate volume of blood received in culture bottles   Culture   Final    NO GROWTH 2 DAYS Performed at [facility], [address] [place], [place] [address]    Report Status PENDING   Incomplete     Radiology Studies: US Abdomen Limited RUQ  Result Date: 02/14/2020 CLINICAL DATA:  Abnormal liver function tests. EXAM: ULTRASOUND ABDOMEN LIMITED RIGHT UPPER QUADRANT COMPARISON:  None. FINDINGS: Gallbladder: Large amount of gallbladder sludge. There is abnormal thickening of the gallbladder wall measuring 3.6 mm. The sonographic Murphy's sign however is recorded as negative. Common bile duct: Diameter: 3.8 mm, normal Liver: Diffusely increased parenchymal echogenicity. Portal vein is patent on color Doppler imaging with normal direction of blood flow towards the liver. Other: None. IMPRESSION: 1. Large amount of gallbladder sludge with abnormal thickening of the gallbladder wall measuring 3.6 mm. 2. The sonographic Murphy's sign however is recorded as negative. Findings are inconclusive for acute cholecystitis. Confirmation with nuclear medicine HIDA scan may be considered. 3. Diffusely increased parenchymal echogenicity of the liver, usually associated with hepatic steatosis or fibrosis. Electronically Signed   By: Fidela Salisbury M.D.   On: 02/14/2020 15:58   Scheduled Meds:  albuterol  2 puff Inhalation Q6H   vitamin C  500 mg Oral Daily   aspirin EC  81 mg Oral Daily   Chlorhexidine Gluconate Cloth  6 each Topical Q0600   feeding supplement (ENSURE ENLIVE)  237 mL Oral TID BM   heparin injection (subcutaneous)  5,000 Units Subcutaneous Q8H   midodrine  2.5 mg Oral TID  WC   multivitamin with minerals  1 tablet Oral Daily   phosphorus  500 mg Oral Q4H   predniSONE  50 mg Oral Daily   sodium chloride flush  3 mL Intravenous Q12H   ticagrelor  90 mg Oral BID   zinc sulfate  220 mg Oral Daily   Continuous Infusions:  ampicillin-sulbactam (UNASYN) IV 3 g (02/15/20 1259)     LOS: 6 days    Para Skeans, MD [facility] Pager [phone] If 7PM-7AM, please contact night-coverage [email or web address] Password [facility] 02/15/2020, 2:31 PM

## 2020-02-15 NOTE — Progress Notes (Signed)
SUBJECTIVE: Patient is in respiratory distress   Vitals:   02/15/20 0003 02/15/20 0402 02/15/20 0618 02/15/20 0747  BP: 97/70 103/69    Pulse: 75 86 99   Resp: 19 16 19    Temp: 97.7 F (36.5 C) 97.8 F (36.6 C)  (!) 97.5 F (36.4 C)  TempSrc: Axillary Axillary  Axillary  SpO2: 97% 94% (!) 89%   Weight:  69 kg    Height:        Intake/Output Summary (Last 24 hours) at 02/15/2020 1014 Last data filed at 02/15/2020 0938 Gross per 24 hour  Intake 3 ml  Output 2600 ml  Net -2597 ml    LABS: Basic Metabolic Panel: Recent Labs    02/14/20 0426 02/14/20 0426 02/14/20 1835 02/15/20 0428  NA 143   < > 141 141  K 3.6  --   --  3.5  CL 107  --   --  103  CO2 25  --   --  25  GLUCOSE 109*  --   --  105*  BUN 37*  --   --  36*  CREATININE 0.97  --   --  0.97  CALCIUM 8.2*  --   --  8.0*  MG 2.4   < > 2.2 2.3  PHOS 2.4*  --   --  2.2*   < > = values in this interval not displayed.   Liver Function Tests: Recent Labs    02/13/20 0506 02/14/20 0426  AST 83* 134*  ALT 40 54*  ALKPHOS 269* 303*  BILITOT 2.5* 3.6*  PROT 6.0* 6.4*  ALBUMIN 2.3* 2.5*   No results for input(s): LIPASE, AMYLASE in the last 72 hours. CBC: Recent Labs    02/13/20 0506 02/13/20 1303 02/14/20 0426 02/14/20 1307 02/14/20 2053 02/15/20 0428  WBC 9.8  --  10.4  --   --  12.2*  NEUTROABS 8.8*  --  9.5*  --   --   --   HGB 12.3*   < > 12.3*   < > 11.5* 10.7*  11.0*  HCT 35.4*   < > 35.5*   < > 32.5* 31.5*  30.8*  MCV 84.3  --  82.6  --   --  85.4  PLT 142*  --  174  --   --  151   < > = values in this interval not displayed.   Cardiac Enzymes: No results for input(s): CKTOTAL, CKMB, CKMBINDEX, TROPONINI in the last 72 hours. BNP: Invalid input(s): POCBNP D-Dimer: No results for input(s): DDIMER in the last 72 hours. Hemoglobin A1C: No results for input(s): HGBA1C in the last 72 hours. Fasting Lipid Panel: No results for input(s): CHOL, HDL, LDLCALC, TRIG, CHOLHDL, LDLDIRECT in the  last 72 hours. Thyroid Function Tests: No results for input(s): TSH, T4TOTAL, T3FREE, THYROIDAB in the last 72 hours.  Invalid input(s): FREET3 Anemia Panel: Recent Labs    02/15/20 0428  FERRITIN 1,783*     PHYSICAL EXAM General: Well developed, well nourished, in no acute distress HEENT:  Normocephalic and atramatic Neck:  No JVD.  Lungs: Clear bilaterally to auscultation and percussion. Heart: HRRR . Normal S1 and S2 without gallops or murmurs.  Abdomen: Bowel sounds are positive, abdomen soft and non-tender  Msk:  Back normal, normal gait. Normal strength and tone for age. Extremities: No clubbing, cyanosis or edema.   Neuro: Alert and oriented X 3. Psych:  Good affect, responds appropriately  TELEMETRY: Heart rate 137 ASSESSMENT AND PLAN:  Covid pneumonia with respiratory distress and lactic acidosis and sepsis.  Patient is in respiratory distress and heart rate is ranging between 86 and 137.  Poor prognosis.  Principal Problem:   Acute respiratory failure due to COVID-19 [facility]) Active Problems:   Chronic systolic heart failure (HCC)   Lactic acidosis   Goals of care, counseling/discussion   Palliative care by specialist   Protein-calorie malnutrition, severe    Terre Zabriskie A, MD, [facility] 02/15/2020 10:14 AM

## 2020-02-16 LAB — BASIC METABOLIC PANEL
Anion gap: 10 (ref 5–15)
BUN: 37 mg/dL — ABNORMAL HIGH (ref 8–23)
CO2: 30 mmol/L (ref 22–32)
Calcium: 8.2 mg/dL — ABNORMAL LOW (ref 8.9–10.3)
Chloride: 103 mmol/L (ref 98–111)
Creatinine, Ser: 0.93 mg/dL (ref 0.61–1.24)
GFR calc Af Amer: >60 mL/min (ref >60)
GFR calc non Af Amer: >60 mL/min (ref >60)
Glucose, Bld: 117 mg/dL — ABNORMAL HIGH (ref 70–99)
Potassium: 4.5 mmol/L (ref 3.5–5.1)
Sodium: 143 mmol/L (ref 135–145)

## 2020-02-16 LAB — CBC
HCT: 30.6 % — ABNORMAL LOW (ref 39.0–52.0)
Hemoglobin: 10.6 g/dL — ABNORMAL LOW (ref 13.0–17.0)
MCH: 28.9 pg (ref 26.0–34.0)
MCHC: 34.6 g/dL (ref 30.0–36.0)
MCV: 83.4 fL (ref 80.0–100.0)
Platelets: 168 10*3/uL (ref 150–400)
RBC: 3.67 MIL/uL — ABNORMAL LOW (ref 4.22–5.81)
RDW: 17.2 % — ABNORMAL HIGH (ref 11.5–15.5)
WBC: 15.5 10*3/uL — ABNORMAL HIGH (ref 4.0–10.5)
nRBC: 0 % (ref 0.0–0.2)

## 2020-02-16 LAB — C-REACTIVE PROTEIN: CRP: 11.7 mg/dL — ABNORMAL HIGH (ref <1.0)

## 2020-02-16 LAB — FERRITIN: Ferritin: 2036 ng/mL — ABNORMAL HIGH (ref 24–336)

## 2020-02-16 LAB — HEMOGLOBIN AND HEMATOCRIT, BLOOD
HCT: 28.2 % — ABNORMAL LOW (ref 39.0–52.0)
HCT: 30.6 % — ABNORMAL LOW (ref 39.0–52.0)
Hemoglobin: 9.7 g/dL — ABNORMAL LOW (ref 13.0–17.0)
Hemoglobin: 10.1 g/dL — ABNORMAL LOW (ref 13.0–17.0)

## 2020-02-16 LAB — MAGNESIUM: Magnesium: 2.7 mg/dL — ABNORMAL HIGH (ref 1.7–2.4)

## 2020-02-16 LAB — PHOSPHORUS: Phosphorus: 3.9 mg/dL (ref 2.5–4.6)

## 2020-02-16 LAB — FIBRIN DERIVATIVES D-DIMER (ARMC ONLY): Fibrin derivatives D-dimer (ARMC): 5218.21 ng/mL (FEU) — ABNORMAL HIGH (ref 0.00–499.00)

## 2020-02-16 LAB — LACTATE DEHYDROGENASE: LDH: 1297 U/L — ABNORMAL HIGH (ref 98–192)

## 2020-02-16 MED ORDER — POLYETHYLENE GLYCOL 3350 17 G PO PACK
17.0000 g | PACK | Freq: Once | ORAL | Status: AC
  Administered 2020-02-16: 17 g via ORAL
  Filled 2020-02-16: qty 1

## 2020-02-16 MED ORDER — ENOXAPARIN SODIUM 40 MG/0.4ML ~~LOC~~ SOLN
40.0000 mg | SUBCUTANEOUS | Status: DC
  Administered 2020-02-16 – 2020-02-22 (×7): 40 mg via SUBCUTANEOUS
  Filled 2020-02-16 (×7): qty 0.4

## 2020-02-16 NOTE — Progress Notes (Signed)
    Chart reviewed and updates received. Per recent discussions wife is interested in patient returning home with hospice support. I attempted to contact Mrs. Haaland to further discuss wishes, code status, and what home with hospice would look like for both patient and family. Daughter answered and expressed her mom was unavailable at this time however she would provide message to return call. I also attempted patient's cell # [phone]. Unable to reach and voicemail left. Daughter to notify Mrs. Laurance Flatten I will plan on attempting to contact her again tomorrow morning around 0930. Message details also left on her voicemail.   If family's wishes are for Nicholas Schultz to return home with hospice it will be important for them to understand oxygen requirements in the setting of comfort, what care will look like during EOL, including aggressive symptom management to achieve comfort due to respiratory distress, and patient would need to be a DNR/DNI.   I will discuss recommendations and goals of care with wife once we are able to speak on tomorrow.    Thank you for allowing [facility] to assist in [name] care.   Alda Lea, AGPCNP-BC Palliative Medicine Team  Phone: [phone] Pager: [phone] Amion: N. Cousar   NO CHARGE

## 2020-02-16 NOTE — Progress Notes (Signed)
SUBJECTIVE: Still on 100% nonrebreather    Vitals:   02/16/20 0600 02/16/20 0800 02/16/20 1000 02/16/20 1022  BP: 99/69 94/69 94/70  96/63  Pulse: 92 90 (!) 57 70  Resp: 15 16 (!) 31 18  Temp:    98.7 F (37.1 C)  TempSrc:    Oral  SpO2: 97% 94% 96% 99%  Weight:      Height:        Intake/Output Summary (Last 24 hours) at 02/16/2020 1031 Last data filed at 02/16/2020 0327 Gross per 24 hour  Intake --  Output 925 ml  Net -925 ml    LABS: Basic Metabolic Panel: Recent Labs    02/15/20 0428 02/16/20 0558  NA 141 143  K 3.5 4.5  CL 103 103  CO2 25 30  GLUCOSE 105* 117*  BUN 36* 37*  CREATININE 0.97 0.93  CALCIUM 8.0* 8.2*  MG 2.3 2.7*  PHOS 2.2* 3.9   Liver Function Tests: Recent Labs    02/14/20 0426  AST 134*  ALT 54*  ALKPHOS 303*  BILITOT 3.6*  PROT 6.4*  ALBUMIN 2.5*   No results for input(s): LIPASE, AMYLASE in the last 72 hours. CBC: Recent Labs    02/14/20 0426 02/14/20 1307 02/15/20 0428 02/15/20 1249 02/15/20 2059 02/16/20 0558  WBC 10.4  --  12.2*  --   --  15.5*  NEUTROABS 9.5*  --   --   --   --   --   HGB 12.3*   < > 10.7*  11.0*   < > 10.0* 10.6*  HCT 35.5*   < > 31.5*  30.8*   < > 29.5* 30.6*  MCV 82.6  --  85.4  --   --  83.4  PLT 174  --  151  --   --  168   < > = values in this interval not displayed.   Cardiac Enzymes: No results for input(s): CKTOTAL, CKMB, CKMBINDEX, TROPONINI in the last 72 hours. BNP: Invalid input(s): POCBNP D-Dimer: No results for input(s): DDIMER in the last 72 hours. Hemoglobin A1C: No results for input(s): HGBA1C in the last 72 hours. Fasting Lipid Panel: No results for input(s): CHOL, HDL, LDLCALC, TRIG, CHOLHDL, LDLDIRECT in the last 72 hours. Thyroid Function Tests: No results for input(s): TSH, T4TOTAL, T3FREE, THYROIDAB in the last 72 hours.  Invalid input(s): FREET3 Anemia Panel: Recent Labs    02/16/20 0558  FERRITIN 2,036*     PHYSICAL EXAM General: Well developed, well  nourished, in no acute distress HEENT:  Normocephalic and atramatic Neck:  No JVD.  Lungs: Clear bilaterally to auscultation and percussion. Heart: HRRR . Normal S1 and S2 without gallops or murmurs.  Abdomen: Bowel sounds are positive, abdomen soft and non-tender  Msk:  Back normal, normal gait. Normal strength and tone for age. Extremities: No clubbing, cyanosis or edema.   Neuro: Alert and oriented X 3. Psych:  Good affect, responds appropriately  TELEMETRY: Appears to be in sinus tachycardia this morning  ASSESSMENT AND PLAN: Covid pneumonia with lactic acidosis and paroxysmal atrial fibrillation and heart failure.  Heart rate is gradually improving.  Still confused today.  Principal Problem:   Acute respiratory failure due to COVID-19 [facility]) Active Problems:   Chronic systolic heart failure (HCC)   Lactic acidosis   Goals of care, counseling/discussion   Palliative care by specialist   Protein-calorie malnutrition, severe    Dionisio David, MD, [facility] 02/16/2020 10:31 AM    Progress note

## 2020-02-16 NOTE — Progress Notes (Addendum)
[facility] hospital liaison note:  New referral for [facility] hospice services at home received from [place]. Patient information sent to referral. Chart notes reviewed. Patient is currently on 6 liters of hiflo oxygen and a nonrebreather at 15 liters. This may not be able to be accommodated in a home setting. Writer has left a voice message for patient's wife Neoma Laming to initiate education regarding hospice services and assess for DME needs. Writer has also spoken to Dr. Posey Pronto, staff RN Mattie Marlin as well as Palliative NP Jobe Gibbon. Plan is for Palliative to reengage with family to have further discussions regarding code status and symptom management. Will continue to follow and assist with discharge planning.  Flo Shanks BSN, RN, [facility] Liaison [phone]

## 2020-02-16 NOTE — Progress Notes (Signed)
PROGRESS NOTE    Nicholas Schultz  AST:[id number] DOB: 14-Sep-1945 DOA: 02/14/2020 PCP: Patient, [name] Per   Brief Narrative:  Nicholas Schultz a 74 y.o.malewith medical history significant forcoronary artery disease, heart failure with reduced EF(last known LVEF 30 to 35%),history of ischemic cardiomyopathy, nicotine dependence who presents to the emergency room for evaluation of worsening shortness of breath. Patient was recently exposed to a family member who had COVID-19 infection and had a positive Covid PCR test about 10 days ago. He is unvaccinated.  His wife has been checking his pulse oximetry at home and today noted that his pulse oximetry had been about 86% which prompted patient's visit to the emergency room for further evaluation. He complains of a fever and chills as well as myalgias and weakness but denies having any cough. He has nausea but no vomiting. He denies having any chest pain, no abdominal pain, no dizziness, no lightheadedness, no urinary symptoms or changes in his bowel habits. Labs show sodium 139, potassium 3.9, chloride 103, bicarb 21, BUN 101, BUN 35, creatinine 1.35, calcium 8.4, alkaline phosphatase 129, ALT 35, AST 80, total protein 6.6, LDH1100,troponin38,ferritin3623, lactic acid4.0,procalcitonin 0.18,white count 12.1, hemoglobin 15.1, hematocrit 44.5, MCV 84.1, RDW 16.8, fibrin derivatives 7,5000 Chest x-ray reviewed by me shows bilateral interstitial opacities. Lead EKG shows sinus tachycardia, PVCs and left bundle branch block  Patient is a 74 year old male with a history of ischemic cardiomyopathy, chronic systolic heart failure with last known LVEF of 30 to 35% who presents to the emergency room via EMS for evaluation of hypoxia. Patient had a positive Covid test about 10 days ago and has been checking his pulse oximetry at home.Patient noted to have room air pulse oximetry of 86% prompting a visit to the emergency room. Patient has been  hypotensive with systolic blood pressure in the 80s but is awake and alert. He received 1 L IV fluid hydration in the ER with improvement in his blood pressure. He also received remdesivir and prednisone. He is currently on high flow oxygen and will be admitted to the hospital for further evaluation.  Assessment & Plan:   Principal Problem:   Acute respiratory failure due to COVID-19 [facility]) Active Problems:   Chronic systolic heart failure (HCC)   Lactic acidosis   Goals of care, counseling/discussion   Palliative care by specialist   Protein-calorie malnutrition, severe Acute hypoxic respiratory failure and severe sepsis secondary to COVID-19 pneumonia:  Patient meets severe sepsis criteria based on tachycardia, tachypnea, leukocytosis, hypoxia and lactic acid of 4.0.  Patient was requiring up to 13 L of high flow oxygen up until last night but this morning, he states that he was feeling better and currently he is on 6 L of high flow.  Significantly elevated D-dimer.  CT angiogram negative for PE.  Slight improvement in inflammatory markers otherwise.  Continue to wean oxygen.  Repeat lactic acid.  Continue following Remdesivir per pharmacy protocol IV Solu-Medrol Bronchodilator, antitussive.  Patient was encouraged to prone, out of bed to chair, to use incentive spirometry and flutter valve. Baricitinib  off label use -patient himself too confused to comprehend benefits and risks of this medication and thus he is not in a position to provide any consent.  Patient's wife was told that if COVID-19 pneumonitis gets worse we might potentially use Actemra/ barititinib off label, she told me that patient has no known history of active diverticulitis, tuberculosis or hepatitis, understands the risks and benefits and wants to proceed with Actemra/barititinib and  thus I will start him on baricitinib. The treatment plan and use of medications and known side effects were discussed with patient/family, they  were clearly explained that there is no proven definitive treatment for COVID-19 infection, any medications used here are based on published clinical articles/anecdotal data which are not peer-reviewed or randomized control trials.  Complete risks and long-term side effects are unknown, however in the best clinical judgment they seem to be of some clinical benefit rather than medical risks.  Patient/family agree with the treatment plan and want to receive the given medications. -9/22 As pt is still requiring high amount of oxygen and NR we will continue to provide him with supplemental  Oxygen, and steroids. Will start pt on empiric t/t of PE due to elevated fibrin level. We will also start pt on anticoagulation to prevent any vte's.  -9/23 Pt seen today and after starting him on Lovenox his hemoglobin dropped , but pt also had been started on ivf , we will assess if there is any further drops or active bleeding.  lovenox is d/c and pt is started on heparin q8h for dvt prophylaxis. With IVF pt has clinically improved.  -9/24 Pt is doing poorly today have contacted wife and explained to her about volume management and difficulty with diuresis and low bp and hypotension and lactic acidosis. Cardiology consulted [place] following patient . We will give lasix and move pt to PCU. 9/25 Pt is alert with sitter at bedside and is on NR and and [place].  Pt has guarded prognosis as he is in respiratory failrue due to combined covid pneumonia and chf with reduced EF. Have d/w wife that since past few days  Pt has not  been responding to diuretic as far as respiratory failure worsening.  Have d/w wife to come and be with pt and also in my opinion we would be doing pt harm more than good if he were to be put thru a cardiac arrest.  9/26 Patient seen today he is very somnolent currently on nasal cannula and nonrebreather with O2 sats in the high 90s.  Patient has mittens in both hands to prevent him from harm removing  his nasal cannula.  He also has a Actuary at bedside.  I saw wife yesterday and discussed with her about his current status and respiratory failure from the Covid pneumonia and decreased ejection fraction congestive heart failure.  Patient is very frail and is not a good candidate for resuscitation in case of cardiac arrest I discussed this with his wife yesterday Ms. Neoma Laming.  Family has been visiting patient as he is doing poorly.  Patient is already on IV antibiotics and supplemental oxygen.  We will repeat a chest x-ray.  BNP for volume assessment is 1827.  Diuretic therapy with Lasix I have intermittently started patient on it with parameters to prevent hypotension.  Labs today show normal creatinine of 0.97.  LDH of 1474, ferritin of 1783, and CBC shows a white count of 12.2, hemoglobin of 10.7.  9/27 I have d/w wife on phone x 2 and also yesterday in person that he is not a good candidate for chest compression. Also that he is appropriate for hospice services as I do not feel he will recover. With additional treatment and regimen.    Chronic systolic congestive heart failure:  Patient's LVEF is 30 to 35% per echo done previously.  He was hypotensive in the ED and received 1 L of IV fluid.  Lasix were held  because of that.  Now his blood pressure is better.  We will give him 1 dose of Lasix 20 mg IV in effort to keep him dry and prevent fluid overload which is proved to be detrimental in COVID-19 patients.  Will reassess daily for further Lasix doses.  Continue aspirin and Brilinta.have requested cardiology consult for reduced ef chf and hypoperfusion. Appreciate cardiology managemtn. -lasix 2 mg iv x 2 doses. -Enalapril / or aldactone low dose once Hypotension is resolved.  -meds held as BP is low normal and pt is lasix.  Patient is currently maintained on nonrebreather and high flow oxygen. Cautious diuretic therapy intermittently to prevent hypotension and precipitation of AKI. We cannot diurese  pt he is hypotensive and confused and is guarded prognosis.   Metabolic acidosis due to lactic acid level: ? If this is infectious or noninfectious etiology. We will treat empirically with iv abx and vancomycin and zosyn.   we will start pt on Lovenox and monitor.  ABG is pending.  Nephrology consult.  attribute to perfusion issue with decreased ef CHF. abx d/c. Cautious diuretic therapy.  Notable acidosis attributed to lactic acidosis secondary to hypoperfusion due to reduced ejection fraction congestive heart failure.   Anemia: Pt has stable hemoglobin.  We will cont iv ppi and type /screen / transfuse if needed. Iron studies show anemic of chronic disease. Hb is 11.8.  H&H is 10.7 today. Hb is 9.7 today and will cont to monitor ,. Unless family and or patient wishes otherwise.   DVT prophylaxis: SCD;d/ heparin 5000 subcut q 8.  Code Status:  Full code. Family Communication: None at bedside. Disposition Plan: TBD.  Status is: Inpatient Dispo: The patient is from: Home              Anticipated d/c is to: SNF              Anticipated d/c date is: about 5 days.              Patient currently is not medically stable to d/c.   Consultants:  Cardiology; Dr. Clayborn Bigness.   Subjective: Pt is alert and answers questions but is sleepy. Vitals are stable but his lactic acid has been elevated and has remained elevated since yesterday. D/D include Sepsis/ Ischemia. Pt has been admitted for covid PNA and is on HFNC at 15L.Intermittenyl has been hypoxic , will dw/ Dr. Mortimer Fries if pt can be transferred to ICU for worsening covid-19 related respiratory failure.   9/23 Pt is alert awake and oriented and eating breakfast.  Reviewed cardiology note and will request nephrology consult.   9/24 pt is alert and oriented and denies any complaints.  Pt has been hypotensive and in CHF. SpO2: 100 % O2 Flow Rate (L/min): 15 L/min Due to intermittent agitation he has a Actuary.   9/25 Pt seen for f/u  he is alert but not his usual self.  9/26 Pt on ns and has mittens and is somnolent and confused.  D/w wife and daughter about him being poor candidate.   Objective: Vitals:   02/16/20 1000 02/16/20 1022 02/16/20 1600 02/16/20 1618  BP: 94/70 96/63 94/66    Pulse: (!) 57 70 73 67  Resp: (!) 31 18 15  (!) 29  Temp:  98.7 F (37.1 C)    TempSrc:  Oral Axillary   SpO2: 96% 99% 99% 100%  Weight:      Height:        Intake/Output Summary (Last 24 hours) at  02/16/2020 1642 Last data filed at 02/16/2020 0327 Gross per 24 hour  Intake --  Output 500 ml  Net -500 ml   Filed Weights   02/12/20 1731 02/15/20 0402 02/16/20 0416  Weight: 72.2 kg 69 kg 66.6 kg    Examination: Blood pressure 94/66, pulse 67, temperature 98.7 F (37.1 C), temperature source Oral, resp. rate (!) 29, height 5\' 11"  (1.803 m), weight 66.6 kg, SpO2 100 %. General exam: Appears calm and comfortable  Respiratory system: Clear to auscultation. Respiratory effort normal. Cardiovascular system:  RRR. No JVD, murmurs, rubs, gallops or clicks. No pedal edema. Gastrointestinal system: Abdomen is nondistended, soft and nontender. No organomegaly or masses felt. Normal bowel sounds heard. Central nervous system: Alert and oriented. No focal neurological deficits. Extremities: pt moving all ext but slowly  Skin: No rashes, lesions or ulcers Psychiatry: Judgement and insight appear normal. Mood & affect appropriate.   Data Reviewed: I have personally reviewed following labs and imaging studies  I/O last 3 completed shifts: In: 3 [I.V.:3] Out: 2025 [Urine:2025] No intake/output data recorded. Lab Results  Component Value Date   CREATININE 0.93 02/16/2020   CREATININE 0.97 02/15/2020   CREATININE 0.97 02/14/2020   CBC: Recent Labs  Lab 02/10/20 0520 02/10/20 0520 02/11/20 0654 02/11/20 0654 02/12/20 0543 02/12/20 1323 02/13/20 0506 02/13/20 1303 02/14/20 0426 02/14/20 1307 02/15/20 0428 02/15/20 1249  02/15/20 2059 02/16/20 0558 02/16/20 1250  WBC 10.7*   < > 8.2   < > 4.9  --  9.8  --  10.4  --  12.2*  --   --  15.5*  --   NEUTROABS 8.8*  --  6.9  --  4.1  --  8.8*  --  9.5*  --   --   --   --   --   --   HGB 14.9   < > 15.7   < > 12.5*   < > 12.3*   < > 12.3*   < > 10.7*  11.0* 10.1* 10.0* 10.6* 9.7*  HCT 44.0   < > 47.8   < > 36.5*   < > 35.4*   < > 35.5*   < > 31.5*  30.8* 30.0* 29.5* 30.6* 28.2*  MCV 83.5   < > 86.0   < > 83.1  --  84.3  --  82.6  --  85.4  --   --  83.4  --   PLT 155   < > 135*   < > 122*  --  142*  --  174  --  151  --   --  168  --    < > = values in this interval not displayed.   Basic Metabolic Panel: Recent Labs  Lab 02/12/20 0543 02/12/20 0543 02/13/20 0506 02/14/20 0426 02/14/20 1835 02/15/20 0428 02/16/20 0558  NA 140   < > 142 143 141 141 143  K 4.0  --  4.2 3.6  --  3.5 4.5  CL 106  --  108 107  --  103 103  CO2 25  --  23 25  --  25 30  GLUCOSE 175*  --  164* 109*  --  105* 117*  BUN 35*  --  36* 37*  --  36* 37*  CREATININE 1.07  --  0.97 0.97  --  0.97 0.93  CALCIUM 8.1*  --  8.1* 8.2*  --  8.0* 8.2*  MG 2.6*   < > 2.6* 2.4 2.2  2.3 2.7*  PHOS 3.6  --  2.7 2.4*  --  2.2* 3.9   < > = values in this interval not displayed.   GFR: Estimated Creatinine Clearance: 64.7 mL/min (by C-G formula based on SCr of 0.93 mg/dL). Liver Function Tests: Recent Labs  Lab 02/10/20 0520 02/11/20 0654 02/12/20 0543 02/13/20 0506 02/14/20 0426  AST 67* 54* 56* 83* 134*  ALT 35 37 38 40 54*  ALKPHOS 120 116 104 269* 303*  BILITOT 2.0* 2.4* 2.7* 2.5* 3.6*  PROT 6.6 6.4* 5.6* 6.0* 6.4*  ALBUMIN 2.9* 2.7* 2.3* 2.3* 2.5*   Lipid Profile: No results for input(s): CHOL, HDL, LDLCALC, TRIG, CHOLHDL, LDLDIRECT in the last 72 hours. Thyroid Function Tests: No results for input(s): TSH, T4TOTAL, FREET4, T3FREE, THYROIDAB in the last 72 hours. Anemia Panel: Recent Labs    02/15/20 0428 02/16/20 0558  FERRITIN 1,783* 2,036*   Sepsis Labs: Recent  Labs  Lab 02/11/20 2332 02/12/20 0543 02/13/20 0951 02/13/20 1303  LATICACIDVEN 3.9* 2.3* 2.9* 3.2*    Recent Results (from the past 240 hour(s))  SARS Coronavirus 2 by RT PCR (hospital order, performed in [facility] hospital lab) Nasopharyngeal Nasopharyngeal Swab     Status: Abnormal   Collection Time: 02/08/2020  2:05 PM   Specimen: Nasopharyngeal Swab  Result Value Ref Range Status   SARS Coronavirus 2 POSITIVE (A) NEGATIVE Final    Comment: RESULT CALLED TO, READ BACK BY AND VERIFIED WITH: AMY COWEN 02/02/2020 AT 1534 BY ACR (NOTE) SARS-CoV-2 target nucleic acids are DETECTED  SARS-CoV-2 RNA is generally detectable in upper respiratory specimens  during the acute phase of infection.  Positive results are indicative  of the presence of the identified virus, but do not rule out bacterial infection or co-infection with other pathogens not detected by the test.  Clinical correlation with patient history and  other diagnostic information is necessary to determine patient infection status.  The expected result is negative.  Fact Sheet for Patients:   StrictlyIdeas.no   Fact Sheet for Healthcare Providers:   BankingDealers.co.za    This test is not yet approved or cleared by the [place] FDA and  has been authorized for detection and/or diagnosis of SARS-CoV-2 by FDA under an Emergency Use Authorization (EUA).  This EUA will remain in effect (meaning this test  can be used) for the duration of  the COVID-19 declaration under Section 564(b)(1) of the Act, 21 U.S.C. section 360-bbb-3(b)(1), unless the authorization is terminated or revoked sooner.  Performed at [facility], [address] [place], [place] [address]   Blood Culture (routine x 2)     Status: None   Collection Time: 01/25/2020  2:20 PM   Specimen: BLOOD  Result Value Ref Range Status   Specimen Description BLOOD BLOOD LEFT ARM  Final   Special  Requests   Final    BOTTLES DRAWN AEROBIC AND ANAEROBIC Blood Culture adequate volume   Culture   Final    NO GROWTH 5 DAYS Performed at [facility], [address], [place], [place] [address]    Report Status 02/14/2020 FINAL  Final  Blood Culture (routine x 2)     Status: None   Collection Time: 02/02/2020  2:28 PM   Specimen: BLOOD  Result Value Ref Range Status   Specimen Description BLOOD RIGHT ANTECUBITAL  Final   Special Requests   Final    BOTTLES DRAWN AEROBIC AND ANAEROBIC Blood Culture adequate volume   Culture  Final    NO GROWTH 5 DAYS Performed at [facility], [address] [place], [place] [address]    Report Status 02/14/2020 FINAL  Final  Culture, blood (Routine X 2) w Reflex to ID Panel     Status: Abnormal   Collection Time: 02/11/20  2:19 PM   Specimen: BLOOD  Result Value Ref Range Status   Specimen Description   Final    BLOOD LEFT ANTECUBITAL Performed at [facility], [address] [place], [place] [address]    Special Requests   Final    BOTTLES DRAWN AEROBIC AND ANAEROBIC Blood Culture adequate volume Performed at [facility], [address], [place], [place] [address]    Culture  Setup Time   Final    AEROBIC BOTTLE ONLY GRAM POSITIVE RODS CRITICAL RESULT CALLED TO, READ BACK BY AND VERIFIED WITH: Foster @1915  02/12/20 MJU Performed at [facility], [address] [place], [place] [address]    Culture (A)  Final    DIPHTHEROIDS(CORYNEBACTERIUM SPECIES) Standardized susceptibility testing for this organism is not available. Performed at [facility], [place] [address], [place], [place] [address]    Report Status 02/14/2020 FINAL  Final  Culture, blood (Routine X 2) w Reflex to ID Panel     Status: Abnormal   Collection Time: 02/11/20  2:24 PM   Specimen: BLOOD  Result Value Ref Range Status   Specimen Description   Final    BLOOD BLOOD LEFT HAND Performed at [facility], [address], [place], [place] [address]    Special Requests   Final    BOTTLES DRAWN AEROBIC AND ANAEROBIC Blood Culture adequate volume Performed at [facility], [address] [place], [place] [address]    Culture  Setup Time   Final    GRAM POSITIVE COCCI AEROBIC BOTTLE ONLY Organism ID to follow CRITICAL RESULT CALLED TO, READ BACK BY AND VERIFIED WITH: JASON ROBBINS AT 9629 ON 02/12/2020 [place]. Performed at [facility], [address] [place],  [address]    Culture (A)  Final    VIRIDANS STREPTOCOCCUS THE SIGNIFICANCE OF ISOLATING THIS ORGANISM FROM A SINGLE SET OF BLOOD CULTURES WHEN MULTIPLE SETS ARE DRAWN IS UNCERTAIN. PLEASE NOTIFY THE MICROBIOLOGY DEPARTMENT WITHIN ONE WEEK IF SPECIATION AND SENSITIVITIES ARE REQUIRED. Performed at [facility], [place] [address], [place],  [address]    Report Status 02/15/2020 FINAL  Final  Blood Culture ID Panel (Reflexed)     Status: Abnormal   Collection Time: 02/11/20  2:24 PM  Result Value Ref Range Status   Enterococcus faecalis NOT DETECTED NOT DETECTED Final   Enterococcus Faecium NOT DETECTED NOT DETECTED Final   Listeria monocytogenes NOT DETECTED NOT DETECTED Final   Staphylococcus species NOT DETECTED NOT DETECTED Final   Staphylococcus aureus (BCID) NOT DETECTED NOT DETECTED Final   Staphylococcus epidermidis NOT DETECTED NOT DETECTED Final   Staphylococcus lugdunensis NOT DETECTED NOT DETECTED Final   Streptococcus species DETECTED (A) NOT DETECTED Final    Comment: Not Enterococcus species, Streptococcus agalactiae, Streptococcus pyogenes, or Streptococcus pneumoniae. CRITICAL RESULT CALLED TO, READ BACK BY AND VERIFIED WITH: JASON ROBBINS AT 0607 ON 02/12/2020 [place].    Streptococcus agalactiae NOT DETECTED NOT DETECTED Final   Streptococcus pneumoniae NOT DETECTED NOT DETECTED Final   Streptococcus pyogenes NOT DETECTED NOT DETECTED Final   A.calcoaceticus-baumannii NOT  DETECTED NOT DETECTED Final   Bacteroides fragilis NOT DETECTED NOT DETECTED Final  Enterobacterales NOT DETECTED NOT DETECTED Final   Enterobacter cloacae complex NOT DETECTED NOT DETECTED Final   Escherichia coli NOT DETECTED NOT DETECTED Final   Klebsiella aerogenes NOT DETECTED NOT DETECTED Final   Klebsiella oxytoca NOT DETECTED NOT DETECTED Final   Klebsiella pneumoniae NOT DETECTED NOT DETECTED Final   Proteus species NOT DETECTED NOT DETECTED Final   Salmonella species NOT DETECTED NOT DETECTED Final   Serratia marcescens NOT DETECTED NOT DETECTED Final   Haemophilus influenzae NOT DETECTED NOT DETECTED Final   Neisseria meningitidis NOT DETECTED NOT DETECTED Final   Pseudomonas aeruginosa NOT DETECTED NOT DETECTED Final   Stenotrophomonas maltophilia NOT DETECTED NOT DETECTED Final   Candida albicans NOT DETECTED NOT DETECTED Final   Candida auris NOT DETECTED NOT DETECTED Final   Candida glabrata NOT DETECTED NOT DETECTED Final   Candida krusei NOT DETECTED NOT DETECTED Final   Candida parapsilosis NOT DETECTED NOT DETECTED Final   Candida tropicalis NOT DETECTED NOT DETECTED Final   Cryptococcus neoformans/gattii NOT DETECTED NOT DETECTED Final    Comment: Performed at [facility], [address] [place], [place] [address]  Culture, blood (Routine X 2) w Reflex to ID Panel     Status: None (Preliminary result)   Collection Time: 02/13/20  9:51 AM   Specimen: BLOOD  Result Value Ref Range Status   Specimen Description BLOOD [facility]  Final   Special Requests BOTTLES DRAWN AEROBIC ONLY [place]  Final   Culture   Final    NO GROWTH 3 DAYS Performed at [facility], [address], [place], [place] [address]    Report Status PENDING  Incomplete  Culture, blood (Routine X 2) w Reflex to ID Panel     Status: None (Preliminary result)   Collection Time: 02/13/20  1:02 PM   Specimen: BLOOD  Result Value Ref Range Status   Specimen Description BLOOD BLOOD RIGHT  HAND  Final   Special Requests   Final    BOTTLES DRAWN AEROBIC AND ANAEROBIC Blood Culture results may not be optimal due to an inadequate volume of blood received in culture bottles   Culture   Final    NO GROWTH 3 DAYS Performed at [facility], [address] [place], [place] [address]    Report Status PENDING  Incomplete     Radiology Studies: DG Chest 1 View  Result Date: 02/15/2020 CLINICAL DATA:  Respiratory failure secondary to COVID-19. EXAM: CHEST  1 VIEW COMPARISON:  Chest radiograph September 24 21 FINDINGS: Monitoring leads overlie the patient. Stable cardiac and mediastinal contours. Mild interval improvement diffuse bilateral airspace opacities. No pleural effusion or pneumothorax. IMPRESSION: Mild interval improvement diffuse bilateral airspace opacities. Electronically Signed   By: Lovey Newcomer M.D.   On: 02/15/2020 14:48   Scheduled Meds: . albuterol  2 puff Inhalation Q6H  . vitamin C  500 mg Oral Daily  . aspirin EC  81 mg Oral Daily  . Chlorhexidine Gluconate Cloth  6 each Topical Q0600  . enoxaparin (LOVENOX) injection  40 mg Subcutaneous Q24H  . feeding supplement (ENSURE ENLIVE)  237 mL Oral TID BM  . midodrine  2.5 mg Oral TID WC  . multivitamin with minerals  1 tablet Oral Daily  . predniSONE  50 mg Oral Daily  . sodium chloride flush  3 mL Intravenous Q12H  . ticagrelor  90 mg Oral BID  . zinc sulfate  220 mg Oral Daily   Continuous Infusions:    LOS: 7  days    Para Skeans, MD [facility] Pager [phone] If 7PM-7AM, please contact night-coverage [email or web address] Password [facility] 02/16/2020, 4:42 PM

## 2020-02-16 NOTE — Clinical Social Work Note (Signed)
Pt spouse wants pt to d/c home with hospice ASAP. However, pt is on a non rebreather and heated high flow. Per Santiago Glad with [facility] the oxygen requirement is to high at this time for hospice services at home. Santiago Glad states she notified MD. Awaiting call back from pt's spouse.  [place], [place]

## 2020-02-17 DIAGNOSIS — E43 Unspecified severe protein-calorie malnutrition: Secondary | ICD-10-CM

## 2020-02-17 LAB — FIBRIN DERIVATIVES D-DIMER (ARMC ONLY): Fibrin derivatives D-dimer (ARMC): 6410.3 ng/mL (FEU) — ABNORMAL HIGH (ref 0.00–499.00)

## 2020-02-17 LAB — HEMOGLOBIN AND HEMATOCRIT, BLOOD
HCT: 32.3 % — ABNORMAL LOW (ref 39.0–52.0)
Hemoglobin: 11.1 g/dL — ABNORMAL LOW (ref 13.0–17.0)

## 2020-02-17 LAB — BASIC METABOLIC PANEL
Anion gap: 8 (ref 5–15)
BUN: 32 mg/dL — ABNORMAL HIGH (ref 8–23)
CO2: 29 mmol/L (ref 22–32)
Calcium: 8 mg/dL — ABNORMAL LOW (ref 8.9–10.3)
Chloride: 100 mmol/L (ref 98–111)
Creatinine, Ser: 0.72 mg/dL (ref 0.61–1.24)
GFR calc Af Amer: >60 mL/min (ref >60)
GFR calc non Af Amer: >60 mL/min (ref >60)
Glucose, Bld: 104 mg/dL — ABNORMAL HIGH (ref 70–99)
Potassium: 3.6 mmol/L (ref 3.5–5.1)
Sodium: 137 mmol/L (ref 135–145)

## 2020-02-17 LAB — CBC
HCT: 28.1 % — ABNORMAL LOW (ref 39.0–52.0)
Hemoglobin: 9.6 g/dL — ABNORMAL LOW (ref 13.0–17.0)
MCH: 28.3 pg (ref 26.0–34.0)
MCHC: 34.2 g/dL (ref 30.0–36.0)
MCV: 82.9 fL (ref 80.0–100.0)
Platelets: 199 10*3/uL (ref 150–400)
RBC: 3.39 MIL/uL — ABNORMAL LOW (ref 4.22–5.81)
RDW: 17 % — ABNORMAL HIGH (ref 11.5–15.5)
WBC: 14.8 10*3/uL — ABNORMAL HIGH (ref 4.0–10.5)
nRBC: 0 % (ref 0.0–0.2)

## 2020-02-17 LAB — C-REACTIVE PROTEIN: CRP: 8.4 mg/dL — ABNORMAL HIGH (ref <1.0)

## 2020-02-17 LAB — PHOSPHORUS: Phosphorus: 2.9 mg/dL (ref 2.5–4.6)

## 2020-02-17 LAB — FERRITIN: Ferritin: 1338 ng/mL — ABNORMAL HIGH (ref 24–336)

## 2020-02-17 LAB — LACTATE DEHYDROGENASE: LDH: 1115 U/L — ABNORMAL HIGH (ref 98–192)

## 2020-02-17 LAB — MAGNESIUM: Magnesium: 2.5 mg/dL — ABNORMAL HIGH (ref 1.7–2.4)

## 2020-02-17 NOTE — Progress Notes (Signed)
Daily Progress Note   Patient Name: Nicholas Schultz       Date: 02/17/2020 DOB: 10/20/1945  Age: 74 y.o. MRN#: [id number] Attending Physician: Para Skeans, MD Primary Care Physician: Patient, [name] Per Admit Date: 02/10/2020  Reason for Consultation/Follow-up: Establishing goals of care  Chart reviewed. Updates received.  No family at the bedside. I was able to speak with wife and daughter via phone as scheduled. Updates provided to family and all questions answered.   Detailed discussion on patient's current condition. Wife verbalizes her understanding expressing her hopes that patient would have shown more improvement by this time. Support provided. She expressed their feelings towards vaccination and she also shared several family members and friends experiences with their COVID recovery. I encouraged wife to focus on her husband's condition and illness, although others illness may be similar each individual may respond differently. She verbalized understanding.   We discussed at length what was most important to Mr. Spiering and for him. Wife reports patient really would like to return home and be with family, however she is concerned of how well he would do and if she could handle the care that he would need. We discussed at length best case and worst case scenario. Education provided regarding hospice care in the home allowing patient to be kept comfortable and symptoms to be effectively managed. Mrs. Hitchner verbalized understanding however she expressed frustrations with her needed decisions and would like to not make any hasty decisions until she spoke further with the medical team later today.   I attempted to discuss patient's full code status with consideration to his current illness and co-morbidities. Wife verbalized understanding. She expressed she, patient, or her daughters would not want him to undergo life-sustaining measures. Daughter verbalized agreement however, Mrs. Harm reports she  does not wish to change his status at this time as she wishes to continue with discussions with provider.   I encouraged family to continue ongoing discussions and as they make decisions keeping patient's quality of life and wishes as the center focus of their decisions.   All questions answered. Support provided.    Length of Stay: 8 days  Vital Signs: BP 94/64    Pulse 89    Temp 97.6 F (36.4 C)    Resp 15    Ht 5\' 11"  (1.803 m)    Wt 67.6 kg    SpO2 91%    BMI 20.79 kg/m  SpO2: SpO2: 91 % O2 Device: O2 Device: High Flow Nasal Cannula O2 Flow Rate: O2 Flow Rate (L/min): 10 L/min     Palliative Care Assessment & Plan   Code Status:  Full code (on-going discussion)  Goals of Care/Recommendations:  Wife is not ready to make patient a DNR and would like to continue with on-going discussions.   Family is focused on upcoming meeting with medical team and is not prepared to make any final decisions in patient's care at this time. Encouraged ongoing discussions keeping centered focus on patient's quality of life and wishes.   PMT will continue to support and follow.    Prognosis: Guarded-Poor   Discharge Planning: To Be Determined  Thank you for allowing the Palliative Medicine Team to assist in the care of this patient.  Time Total: 55 min.   Visit consisted of counseling and education dealing with the complex and emotionally intense issues of symptom management and palliative care in the setting of serious and potentially life-threatening illness.Greater than 50%  of this  time was spent counseling and coordinating care related to the above assessment and plan.  Alda Lea, AGPCNP-BC  Palliative Medicine Team [phone]

## 2020-02-17 NOTE — Progress Notes (Signed)
PROGRESS NOTE    Nicholas Schultz  JQB:[id number] DOB: [birth year] DOA: 02/03/2020 PCP: Patient, [name] Per   Brief Narrative:  Nicholas Schultz a 74 y.o.malewith medical history significant forcoronary artery disease, heart failure with reduced EF(last known LVEF 30 to 35%),history of ischemic cardiomyopathy, nicotine dependence who presents to the emergency room for evaluation of worsening shortness of breath. Patient was recently exposed to a family member who had COVID-19 infection and had a positive Covid PCR test about 10 days ago. He is unvaccinated.  His wife has been checking his pulse oximetry at home and today noted that his pulse oximetry had been about 86% which prompted patient's visit to the emergency room for further evaluation. He complains of a fever and chills as well as myalgias and weakness but denies having any cough. He has nausea but no vomiting. He denies having any chest pain, no abdominal pain, no dizziness, no lightheadedness, no urinary symptoms or changes in his bowel habits. Labs show sodium 139, potassium 3.9, chloride 103, bicarb 21, BUN 101, BUN 35, creatinine 1.35, calcium 8.4, alkaline phosphatase 129, ALT 35, AST 80, total protein 6.6, LDH1100,troponin38,ferritin3623, lactic acid4.0,procalcitonin 0.18,white count 12.1, hemoglobin 15.1, hematocrit 44.5, MCV 84.1, RDW 16.8, fibrin derivatives 7,5000 Chest x-ray reviewed by me shows bilateral interstitial opacities. Lead EKG shows sinus tachycardia, PVCs and left bundle branch block  Patient is a 74 year old male with a history of ischemic cardiomyopathy, chronic systolic heart failure with last known LVEF of 30 to 35% who presents to the emergency room via EMS for evaluation of hypoxia. Patient had a positive Covid test about 10 days ago and has been checking his pulse oximetry at home.Patient noted to have room air pulse oximetry of 86% prompting a visit to the emergency room. Patient has been  hypotensive with systolic blood pressure in the 80s but is awake and alert. He received 1 L IV fluid hydration in the ER with improvement in his blood pressure. He also received remdesivir and prednisone. He is currently on high flow oxygen and will be admitted to the hospital for further evaluation.  Assessment & Plan:   Principal Problem:   Acute respiratory failure due to COVID-19 [facility]) Active Problems:   Chronic systolic heart failure (HCC)   Lactic acidosis   Goals of care, counseling/discussion   Palliative care by specialist   Protein-calorie malnutrition, severe Acute hypoxic respiratory failure and severe sepsis secondary to COVID-19 pneumonia:  Patient meets severe sepsis criteria based on tachycardia, tachypnea, leukocytosis, hypoxia and lactic acid of 4.0.  Patient was requiring up to 13 L of high flow oxygen up until last night but this morning, he states that he was feeling better and currently he is on 6 L of high flow.  Significantly elevated D-dimer.  CT angiogram negative for PE.  Slight improvement in inflammatory markers otherwise.  Continue to wean oxygen.  Repeat lactic acid.  Continue following Remdesivir per pharmacy protocol IV Solu-Medrol Bronchodilator, antitussive.  Patient was encouraged to prone, out of bed to chair, to use incentive spirometry and flutter valve. Baricitinib  off label use -patient himself too confused to comprehend benefits and risks of this medication and thus he is not in a position to provide any consent.  Patient's wife was told that if COVID-19 pneumonitis gets worse we might potentially use Actemra/ barititinib off label, she told me that patient has no known history of active diverticulitis, tuberculosis or hepatitis, understands the risks and benefits and wants to proceed with Actemra/barititinib and  thus I will start him on baricitinib. The treatment plan and use of medications and known side effects were discussed with patient/family, they  were clearly explained that there is no proven definitive treatment for COVID-19 infection, any medications used here are based on published clinical articles/anecdotal data which are not peer-reviewed or randomized control trials.  Complete risks and long-term side effects are unknown, however in the best clinical judgment they seem to be of some clinical benefit rather than medical risks.  Patient/family agree with the treatment plan and want to receive the given medications. -9/22 As pt is still requiring high amount of oxygen and NR we will continue to provide him with supplemental  Oxygen, and steroids. Will start pt on empiric t/t of PE due to elevated fibrin level. We will also start pt on anticoagulation to prevent any vte's.  -9/23 Pt seen today and after starting him on Lovenox his hemoglobin dropped , but pt also had been started on ivf , we will assess if there is any further drops or active bleeding.  lovenox is d/c and pt is started on heparin q8h for dvt prophylaxis. With IVF pt has clinically improved.  -9/24 Pt is doing poorly today have contacted wife and explained to her about volume management and difficulty with diuresis and low bp and hypotension and lactic acidosis. Cardiology consulted [place] following patient . We will give lasix and move pt to PCU. 9/25 Pt is alert with sitter at bedside and is on NR and and [place].  Pt has guarded prognosis as he is in respiratory failrue due to combined covid pneumonia and chf with reduced EF. Have d/w wife that since past few days  Pt has not  been responding to diuretic as far as respiratory failure worsening.  Have d/w wife to come and be with pt and also in my opinion we would be doing pt harm more than good if he were to be put thru a cardiac arrest.  9/26 Patient seen today he is very somnolent currently on nasal cannula and nonrebreather with O2 sats in the high 90s.  Patient has mittens in both hands to prevent him from harm removing  his nasal cannula.  He also has a Actuary at bedside.  I saw wife yesterday and discussed with her about his current status and respiratory failure from the Covid pneumonia and decreased ejection fraction congestive heart failure.  Patient is very frail and is not a good candidate for resuscitation in case of cardiac arrest I discussed this with his wife yesterday Ms. Neoma Laming.  Family has been visiting patient as he is doing poorly.  Patient is already on IV antibiotics and supplemental oxygen.  We will repeat a chest x-ray.  BNP for volume assessment is 1827.  Diuretic therapy with Lasix I have intermittently started patient on it with parameters to prevent hypotension.  Labs today show normal creatinine of 0.97.  LDH of 1474, ferritin of 1783, and CBC shows a white count of 12.2, hemoglobin of 10.7.  9/27 I have d/w wife on phone x 2 and also yesterday in person that he is not a good candidate for chest compression. Also that he is appropriate for hospice services as I do not feel he will recover. With additional treatment and regimen.  9/28 No change in status.  Discussed with family about poor prognosis and outcome. Family conference held with wife and 3 daughters Myriam Jacobson and [place] and [place] on FaceTime, with Loletha Grayer and Santiago Glad from hospice  along with myself today..  Discussed with family in detail about resuscitation status and comfort measures.  Discussed with daughter about risk of cardiac arrest and father due to hypoxia and acidosis.  Chronic systolic congestive heart failure:  Patient's LVEF is 30 to 35% per echo done previously.  He was hypotensive in the ED and received 1 L of IV fluid.  Lasix were held because of that.  Now his blood pressure is better.  We will give him 1 dose of Lasix 20 mg IV in effort to keep him dry and prevent fluid overload which is proved to be detrimental in COVID-19 patients.  Will reassess daily for further Lasix doses.  Continue aspirin and Brilinta.have requested  cardiology consult for reduced ef chf and hypoperfusion. Appreciate cardiology managemtn. -lasix 2 mg iv x 2 doses. -Enalapril / or aldactone low dose once Hypotension is resolved.  -meds held as BP is low normal and pt is lasix.  Patient is currently maintained on nonrebreather and high flow oxygen. Cautious diuretic therapy intermittently to prevent hypotension and precipitation of AKI. We cannot diurese pt he is hypotensive and confused and is guarded prognosis.   Metabolic acidosis due to lactic acid level: ? If this is infectious or noninfectious etiology. We will treat empirically with iv abx and vancomycin and zosyn.   we will start pt on Lovenox and monitor.  ABG is pending.  Nephrology consult.  attribute to perfusion issue with decreased ef CHF. abx d/c. Cautious diuretic therapy.  Notable acidosis attributed to lactic acidosis secondary to hypoperfusion due to reduced ejection fraction congestive heart failure.   Anemia: Pt has stable hemoglobin.  We will cont iv ppi and type /screen / transfuse if needed. Iron studies show anemic of chronic disease. Hb is 11.8.  H&H is 10.7 today. Hb is 9.7 today and will cont to monitor ,. Unless family and or patient wishes otherwise.   DVT prophylaxis: SCD;d/ heparin 5000 subcut q 8.  Code Status:  Full code. Family Communication: None at bedside. Disposition Plan: TBD.  Status is: Inpatient Dispo: The patient is from: Home              Anticipated d/c is to: SNF              Anticipated d/c date is: about 5 days.              Patient currently is not medically stable to d/c.   Consultants:  Cardiology; Dr. Clayborn Bigness.   Subjective: Pt is alert and answers questions but is sleepy. Vitals are stable but his lactic acid has been elevated and has remained elevated since yesterday. D/D include Sepsis/ Ischemia. Pt has been admitted for covid PNA and is on HFNC at 15L.Intermittenyl has been hypoxic , will dw/ Dr. Mortimer Fries if pt can be  transferred to ICU for worsening covid-19 related respiratory failure.   9/23 Pt is alert awake and oriented and eating breakfast.  Reviewed cardiology note and will request nephrology consult.   9/24 pt is alert and oriented and denies any complaints.  Pt has been hypotensive and in CHF. SpO2: 100 % O2 Flow Rate (L/min): 15 L/min Due to intermittent agitation he has a Actuary.   9/25 Pt seen for f/u he is alert but not his usual self.  9/26 Pt on ns and has mittens and is somnolent and confused.  D/w wife and daughter about him being poor candidate.   Objective: Vitals:   02/17/20 0800 02/17/20 1000  02/17/20 1011 02/17/20 1200  BP: 95/70 91/67 94/68  90/66  Pulse: 77 81 82 80  Resp: 11 10 13 10   Temp: 97.6 F (36.4 C)  97.6 F (36.4 C)   TempSrc:      SpO2: 99% 100% 100% 100%  Weight:      Height:        Intake/Output Summary (Last 24 hours) at 02/17/2020 1441 Last data filed at 02/17/2020 0000 Gross per 24 hour  Intake 560 ml  Output 1050 ml  Net -490 ml   Filed Weights   02/15/20 0402 02/16/20 0416 02/17/20 0400  Weight: 69 kg 66.6 kg 67.6 kg    Examination: Blood pressure 90/66, pulse 80, temperature 97.6 F (36.4 C), resp. rate 10, height 5\' 11"  (1.803 m), weight 67.6 kg, SpO2 100 %. General exam: Appears calm and comfortable  Respiratory system: Bilateral rales posteriorly.  Respiratory effort normal. Cardiovascular system:  RRR. No JVD, murmurs, rubs, gallops or clicks. No pedal edema. Gastrointestinal system: Abdomen is nondistended, soft and nontender. No organomegaly or masses felt. Normal bowel sounds heard. Central nervous system: Alert and oriented. No focal neurological deficits. Extremities: pt moving all ext but slowly  Skin: No rashes, lesions or ulcers Psychiatry: Judgement and insight appear normal. Mood & affect appropriate.   Data Reviewed: I have personally reviewed following labs and imaging studies  I/O last 3 completed shifts: In: 560  [P.O.:560] Out: 1550 [Urine:1550] No intake/output data recorded. Lab Results  Component Value Date   CREATININE 0.72 02/17/2020   CREATININE 0.93 02/16/2020   CREATININE 0.97 02/15/2020   CBC: Recent Labs  Lab 02/11/20 0654 02/11/20 0654 02/12/20 0543 02/12/20 1323 02/13/20 0506 02/13/20 1303 02/14/20 0426 02/14/20 1307 02/15/20 0428 02/15/20 1249 02/16/20 0558 02/16/20 1250 02/16/20 2120 02/17/20 0550 02/17/20 1247  WBC 8.2   < > 4.9  --  9.8  --  10.4  --  12.2*  --  15.5*  --   --  14.8*  --   NEUTROABS 6.9  --  4.1  --  8.8*  --  9.5*  --   --   --   --   --   --   --   --   HGB 15.7   < > 12.5*   < > 12.3*   < > 12.3*   < > 10.7*   11.0*   < > 10.6* 9.7* 10.1* 9.6* 11.1*  HCT 47.8   < > 36.5*   < > 35.4*   < > 35.5*   < > 31.5*   30.8*   < > 30.6* 28.2* 30.6* 28.1* 32.3*  MCV 86.0   < > 83.1  --  84.3  --  82.6  --  85.4  --  83.4  --   --  82.9  --   PLT 135*   < > 122*  --  142*  --  174  --  151  --  168  --   --  199  --    < > = values in this interval not displayed.   Basic Metabolic Panel: Recent Labs  Lab 02/13/20 0506 02/13/20 0506 02/14/20 0426 02/14/20 1835 02/15/20 0428 02/16/20 0558 02/17/20 0550  NA 142   < > 143 141 141 143 137  K 4.2  --  3.6  --  3.5 4.5 3.6  CL 108  --  107  --  103 103 100  CO2 23  --  25  --  25 30 29   GLUCOSE 164*  --  109*  --  105* 117* 104*  BUN 36*  --  37*  --  36* 37* 32*  CREATININE 0.97  --  0.97  --  0.97 0.93 0.72  CALCIUM 8.1*  --  8.2*  --  8.0* 8.2* 8.0*  MG 2.6*   < > 2.4 2.2 2.3 2.7* 2.5*  PHOS 2.7  --  2.4*  --  2.2* 3.9 2.9   < > = values in this interval not displayed.   GFR: Estimated Creatinine Clearance: 76.3 mL/min (by C-G formula based on SCr of 0.72 mg/dL). Liver Function Tests: Recent Labs  Lab 02/11/20 0654 02/12/20 0543 02/13/20 0506 02/14/20 0426  AST 54* 56* 83* 134*  ALT 37 38 40 54*  ALKPHOS 116 104 269* 303*  BILITOT 2.4* 2.7* 2.5* 3.6*  PROT 6.4* 5.6* 6.0* 6.4*  ALBUMIN  2.7* 2.3* 2.3* 2.5*   Lipid Profile: No results for input(s): CHOL, HDL, LDLCALC, TRIG, CHOLHDL, LDLDIRECT in the last 72 hours. Thyroid Function Tests: No results for input(s): TSH, T4TOTAL, FREET4, T3FREE, THYROIDAB in the last 72 hours. Anemia Panel: Recent Labs    02/16/20 0558 02/17/20 0550  FERRITIN 2,036* 1,338*   Sepsis Labs: Recent Labs  Lab 02/11/20 2332 02/12/20 0543 02/13/20 0951 02/13/20 1303  LATICACIDVEN 3.9* 2.3* 2.9* 3.2*    Recent Results (from the past 240 hour(s))  SARS Coronavirus 2 by RT PCR (hospital order, performed in [facility] hospital lab) Nasopharyngeal Nasopharyngeal Swab     Status: Abnormal   Collection Time: 02/15/2020  2:05 PM   Specimen: Nasopharyngeal Swab  Result Value Ref Range Status   SARS Coronavirus 2 POSITIVE (A) NEGATIVE Final    Comment: RESULT CALLED TO, READ BACK BY AND VERIFIED WITH: AMY COWEN 02/19/2020 AT 1534 BY ACR (NOTE) SARS-CoV-2 target nucleic acids are DETECTED  SARS-CoV-2 RNA is generally detectable in upper respiratory specimens  during the acute phase of infection.  Positive results are indicative  of the presence of the identified virus, but do not rule out bacterial infection or co-infection with other pathogens not detected by the test.  Clinical correlation with patient history and  other diagnostic information is necessary to determine patient infection status.  The expected result is negative.  Fact Sheet for Patients:   StrictlyIdeas.no   Fact Sheet for Healthcare Providers:   BankingDealers.co.za    This test is not yet approved or cleared by the [place] FDA and  has been authorized for detection and/or diagnosis of SARS-CoV-2 by FDA under an Emergency Use Authorization (EUA).  This EUA will remain in effect (meaning this test  can be used) for the duration of  the COVID-19 declaration under Section 564(b)(1) of the Act, 21 U.S.C. section  360-bbb-3(b)(1), unless the authorization is terminated or revoked sooner.  Performed at [facility], [address] [place], [place] [address]   Blood Culture (routine x 2)     Status: None   Collection Time: 02/05/2020  2:20 PM   Specimen: BLOOD  Result Value Ref Range Status   Specimen Description BLOOD BLOOD LEFT ARM  Final   Special Requests   Final    BOTTLES DRAWN AEROBIC AND ANAEROBIC Blood Culture adequate volume   Culture   Final    NO GROWTH 5 DAYS Performed at [facility], [address], [place], [place] [address]    Report Status 02/14/2020 FINAL  Final  Blood Culture (routine x 2)  Status: None   Collection Time: 02/19/2020  2:28 PM   Specimen: BLOOD  Result Value Ref Range Status   Specimen Description BLOOD RIGHT ANTECUBITAL  Final   Special Requests   Final    BOTTLES DRAWN AEROBIC AND ANAEROBIC Blood Culture adequate volume   Culture   Final    NO GROWTH 5 DAYS Performed at [facility], [address] [place], [place] [address]    Report Status 02/14/2020 FINAL  Final  Culture, blood (Routine X 2) w Reflex to ID Panel     Status: Abnormal   Collection Time: 02/11/20  2:19 PM   Specimen: BLOOD  Result Value Ref Range Status   Specimen Description   Final    BLOOD LEFT ANTECUBITAL Performed at [facility], [address] [place], [place] [address]    Special Requests   Final    BOTTLES DRAWN AEROBIC AND ANAEROBIC Blood Culture adequate volume Performed at [facility], [address] [place], [place] [address]    Culture  Setup Time   Final    AEROBIC BOTTLE ONLY GRAM POSITIVE RODS CRITICAL RESULT CALLED TO, READ BACK BY AND VERIFIED WITH: [place] @1915  02/12/20 MJU Performed at [facility], [address] [place], [place] [address]    Culture (A)  Final    DIPHTHEROIDS(CORYNEBACTERIUM SPECIES) Standardized susceptibility testing for this organism is not available. Performed at  [facility], [place] [address], [place], [place] [address]    Report Status 02/14/2020 FINAL  Final  Culture, blood (Routine X 2) w Reflex to ID Panel     Status: Abnormal   Collection Time: 02/11/20  2:24 PM   Specimen: BLOOD  Result Value Ref Range Status   Specimen Description   Final    BLOOD BLOOD LEFT HAND Performed at [facility], [address] [place], [place] [address]    Special Requests   Final    BOTTLES DRAWN AEROBIC AND ANAEROBIC Blood Culture adequate volume Performed at [facility], [address] [place], [place] [address]    Culture  Setup Time   Final    GRAM POSITIVE COCCI AEROBIC BOTTLE ONLY Organism ID to follow CRITICAL RESULT CALLED TO, READ BACK BY AND VERIFIED WITH: JASON ROBBINS AT 2641 ON 02/12/2020 [place]. Performed at [facility], [address] [place], [place] [address]    Culture (A)  Final    VIRIDANS STREPTOCOCCUS THE SIGNIFICANCE OF ISOLATING THIS ORGANISM FROM A SINGLE SET OF BLOOD CULTURES WHEN MULTIPLE SETS ARE DRAWN IS UNCERTAIN. PLEASE NOTIFY THE MICROBIOLOGY DEPARTMENT WITHIN ONE WEEK IF SPECIATION AND SENSITIVITIES ARE REQUIRED. Performed at [facility], [place] [address] [place], [place] [address]    Report Status 02/15/2020 FINAL  Final  Blood Culture ID Panel (Reflexed)     Status: Abnormal   Collection Time: 02/11/20  2:24 PM  Result Value Ref Range Status   Enterococcus faecalis NOT DETECTED NOT DETECTED Final   Enterococcus Faecium NOT DETECTED NOT DETECTED Final   Listeria monocytogenes NOT DETECTED NOT DETECTED Final   Staphylococcus species NOT DETECTED NOT DETECTED Final   Staphylococcus aureus (BCID) NOT DETECTED NOT DETECTED Final   Staphylococcus epidermidis NOT DETECTED NOT DETECTED Final   Staphylococcus lugdunensis NOT DETECTED NOT DETECTED Final   Streptococcus species DETECTED (A) NOT DETECTED Final    Comment: Not Enterococcus species, Streptococcus agalactiae, Streptococcus  pyogenes, or Streptococcus pneumoniae. CRITICAL RESULT CALLED TO, READ BACK BY AND VERIFIED  WITH: JASON ROBBINS AT 7902 ON 02/12/2020 [place].    Streptococcus agalactiae NOT DETECTED NOT DETECTED Final   Streptococcus pneumoniae NOT DETECTED NOT DETECTED Final   Streptococcus pyogenes NOT DETECTED NOT DETECTED Final   A.calcoaceticus-baumannii NOT DETECTED NOT DETECTED Final   Bacteroides fragilis NOT DETECTED NOT DETECTED Final   Enterobacterales NOT DETECTED NOT DETECTED Final   Enterobacter cloacae complex NOT DETECTED NOT DETECTED Final   Escherichia coli NOT DETECTED NOT DETECTED Final   Klebsiella aerogenes NOT DETECTED NOT DETECTED Final   Klebsiella oxytoca NOT DETECTED NOT DETECTED Final   Klebsiella pneumoniae NOT DETECTED NOT DETECTED Final   Proteus species NOT DETECTED NOT DETECTED Final   Salmonella species NOT DETECTED NOT DETECTED Final   Serratia marcescens NOT DETECTED NOT DETECTED Final   Haemophilus influenzae NOT DETECTED NOT DETECTED Final   Neisseria meningitidis NOT DETECTED NOT DETECTED Final   Pseudomonas aeruginosa NOT DETECTED NOT DETECTED Final   Stenotrophomonas maltophilia NOT DETECTED NOT DETECTED Final   Candida albicans NOT DETECTED NOT DETECTED Final   Candida auris NOT DETECTED NOT DETECTED Final   Candida glabrata NOT DETECTED NOT DETECTED Final   Candida krusei NOT DETECTED NOT DETECTED Final   Candida parapsilosis NOT DETECTED NOT DETECTED Final   Candida tropicalis NOT DETECTED NOT DETECTED Final   Cryptococcus neoformans/gattii NOT DETECTED NOT DETECTED Final    Comment: Performed at [facility], [address] [place], [place] [address]  Culture, blood (Routine X 2) w Reflex to ID Panel     Status: None (Preliminary result)   Collection Time: 02/13/20  9:51 AM   Specimen: BLOOD  Result Value Ref Range Status   Specimen Description BLOOD [facility]  Final   Special Requests BOTTLES DRAWN AEROBIC ONLY [place]  Final   Culture   Final    NO  GROWTH 4 DAYS Performed at [facility], [address] [place], [place] [address]    Report Status PENDING  Incomplete  Culture, blood (Routine X 2) w Reflex to ID Panel     Status: None (Preliminary result)   Collection Time: 02/13/20  1:02 PM   Specimen: BLOOD  Result Value Ref Range Status   Specimen Description BLOOD BLOOD RIGHT HAND  Final   Special Requests   Final    BOTTLES DRAWN AEROBIC AND ANAEROBIC Blood Culture results may not be optimal due to an inadequate volume of blood received in culture bottles   Culture   Final    NO GROWTH 4 DAYS Performed at [facility], [address] [place], [place] [address]    Report Status PENDING  Incomplete     Radiology Studies: No results found. Scheduled Meds:  albuterol  2 puff Inhalation Q6H   vitamin C  500 mg Oral Daily   aspirin EC  81 mg Oral Daily   Chlorhexidine Gluconate Cloth  6 each Topical Q0600   enoxaparin (LOVENOX) injection  40 mg Subcutaneous Q24H   feeding supplement (ENSURE ENLIVE)  237 mL Oral TID BM   midodrine  2.5 mg Oral TID WC   multivitamin with minerals  1 tablet Oral Daily   predniSONE  50 mg Oral Daily   sodium chloride flush  3 mL Intravenous Q12H   ticagrelor  90 mg Oral BID   zinc sulfate  220 mg Oral Daily   Continuous Infusions:    LOS: 8 days    Para Skeans, MD [facility] Pager [phone] If 7PM-7AM, please contact night-coverage  [email or web address] Password [facility] 02/17/2020, 2:41 PM

## 2020-02-17 NOTE — Progress Notes (Signed)
   02/17/20 1011  Assess: MEWS Score  Temp 97.6 F (36.4 C)  BP 94/68  Pulse Rate 82  ECG Heart Rate 82  Resp 13  SpO2 100 %  Assess: MEWS Score  MEWS Temp 0  MEWS Systolic 1  MEWS Pulse 0  MEWS RR 1  MEWS LOC 0  MEWS Score 2  MEWS Score Color Yellow  Assess: if the MEWS score is Yellow or Red  Were vital signs taken at a resting state? Yes  Focused Assessment Change from prior assessment (see assessment flowsheet)  Early Detection of Sepsis Score *See Row Information* Low  MEWS guidelines implemented *See Row Information* Yes  Treat  MEWS Interventions Administered scheduled meds/treatments  Pain Scale 0-10  Pain Score 0  Take Vital Signs  Increase Vital Sign Frequency  Yellow: Q 2hr X 2 then Q 4hr X 2, if remains yellow, continue Q 4hrs  Escalate  MEWS: Escalate Yellow: discuss with charge nurse/RN and consider discussing with provider and RRT  Notify: Charge Nurse/RN  Name of Charge Nurse/RN Notified [place]  Date Charge Nurse/RN Notified 02/17/20  Time Charge Nurse/RN Notified 1119  Document  Patient Outcome Other (Comment) (Pt stable, and in no distress.)

## 2020-02-17 NOTE — Progress Notes (Signed)
[facility] hospital liaison note:  Probation officer was asked to participate in a family meeting with patient's wife and 3 daughters to explain hospice services at home. Questions answered. Code status and goals of care were also discussed. Family is considering DNR code and return home with the support of hospice services. Per patient's staff RN he is currently on 10 liters via nasal cannula, this can be accommodated at home. Writers contact information given to Mrs. Wedemeyer. Flo Shanks BSN, RN, [facility] liaison [facility] [phone]

## 2020-02-18 DIAGNOSIS — J9601 Acute respiratory failure with hypoxia: Secondary | ICD-10-CM

## 2020-02-18 DIAGNOSIS — Z66 Do not resuscitate: Secondary | ICD-10-CM

## 2020-02-18 LAB — CBC
HCT: 26.5 % — ABNORMAL LOW (ref 39.0–52.0)
Hemoglobin: 9.3 g/dL — ABNORMAL LOW (ref 13.0–17.0)
MCH: 29.3 pg (ref 26.0–34.0)
MCHC: 35.1 g/dL (ref 30.0–36.0)
MCV: 83.6 fL (ref 80.0–100.0)
Platelets: 197 10*3/uL (ref 150–400)
RBC: 3.17 MIL/uL — ABNORMAL LOW (ref 4.22–5.81)
RDW: 16.7 % — ABNORMAL HIGH (ref 11.5–15.5)
WBC: 14.2 10*3/uL — ABNORMAL HIGH (ref 4.0–10.5)
nRBC: 0 % (ref 0.0–0.2)

## 2020-02-18 LAB — BASIC METABOLIC PANEL
Anion gap: 7 (ref 5–15)
BUN: 35 mg/dL — ABNORMAL HIGH (ref 8–23)
CO2: 30 mmol/L (ref 22–32)
Calcium: 8.1 mg/dL — ABNORMAL LOW (ref 8.9–10.3)
Chloride: 99 mmol/L (ref 98–111)
Creatinine, Ser: 0.72 mg/dL (ref 0.61–1.24)
GFR calc Af Amer: >60 mL/min (ref >60)
GFR calc non Af Amer: >60 mL/min (ref >60)
Glucose, Bld: 121 mg/dL — ABNORMAL HIGH (ref 70–99)
Potassium: 3.9 mmol/L (ref 3.5–5.1)
Sodium: 136 mmol/L (ref 135–145)

## 2020-02-18 LAB — CULTURE, BLOOD (ROUTINE X 2)
Culture: NO GROWTH
Culture: NO GROWTH

## 2020-02-18 LAB — C-REACTIVE PROTEIN: CRP: 7.6 mg/dL — ABNORMAL HIGH (ref <1.0)

## 2020-02-18 LAB — MAGNESIUM: Magnesium: 2.3 mg/dL (ref 1.7–2.4)

## 2020-02-18 LAB — PHOSPHORUS: Phosphorus: 3 mg/dL (ref 2.5–4.6)

## 2020-02-18 LAB — FERRITIN: Ferritin: 1063 ng/mL — ABNORMAL HIGH (ref 24–336)

## 2020-02-18 LAB — FIBRIN DERIVATIVES D-DIMER (ARMC ONLY): Fibrin derivatives D-dimer (ARMC): 5628.14 ng/mL (FEU) — ABNORMAL HIGH (ref 0.00–499.00)

## 2020-02-18 LAB — LACTATE DEHYDROGENASE: LDH: 968 U/L — ABNORMAL HIGH (ref 98–192)

## 2020-02-18 NOTE — Progress Notes (Signed)
Patient originally on 10 L HF at start of shift with oxygen saturations ranging from 87-92%. RN received call from [place] around 2100 about O2 staying in the 70s. RN assessed patient and placed non- rebreather over Nicholas Schultz. Patient's oxygen saturation has since be high 90s- 100%

## 2020-02-18 NOTE — Progress Notes (Addendum)
PROGRESS NOTE    Bruk Tumolo  TGG:[id number] DOB: [birth year] DOA: 02/03/2020 PCP: Patient, [name] Per   Brief Narrative: Ridgely Anastacio is a 74 y.o. malewith medical history significant forcoronary artery disease, heart failure with reduced EF(last known LVEF 30 to 35%),history of ischemic cardiomyopathy, nicotine dependence. Patient presented secondary to hypoxia in setting of COVID-19 infection. He was started on supportive care and treatment for COVID-19 pneumonia.   Assessment & Plan:   Principal Problem:   Acute respiratory failure due to COVID-19 [facility]) Active Problems:   Chronic systolic heart failure (HCC)   Lactic acidosis   Goals of care, counseling/discussion   Palliative care by specialist   Protein-calorie malnutrition, severe   Acute respiratory failure with hypoxia Secondary to Covid-19 pneumonia.  Patient has undergone CTA chest x2 with no evidence of acute PE. He is still requiring significant amount of oxygen and has been placed on NRB and HFNC. -Wean to room air as able -Keep O2 saturation >90%  Severe sepsis Secondary to COVID-19 pneumonia.  Patient received a short course of vancomycin and Zosyn which was transitioned to Unasyn.  Patient with 1 out of 4 positive blood cultures for Streptococcus viridans which was a likely contaminant in addition to diphtheroids.  COVID-19 pneumonia Patient with bilateral infiltrates seen on imaging.  Patient treated with a 5-day course of remdesivir which she has completed.  He was started on baricitinib which was discontinued after 4 doses.  He was initially managed on Solu-Medrol which has been transitioned to prednisone.  LDH, ferritin, CRP trending down.  No recent LFTs however CMP from 9/25 show worsening AST/ALT -Daily CMP, ferritin, CRP, D-dimer  Chronic systolic heart failure Last left ventricular EF of 30 to 35%.  Lasix initially held for hypotension.  He received intermittent doses of Lasix while  inpatient.  Metabolic acidosis Secondary to lactic acidosis. Resolved.  Leukocytosis In setting of infection and steroid use. Stable.  Anemia Appears to be acute since this admission with slow downtrend. Normocytic. Recent iron panel suggests anemia of critical illness. Will benefit from iron supplementation depending on disease course and goals of care. Does not appear to have evidence of active bleeding.  Coronary artery atherosclerosis Seen on CT angio chest. S/p cardiac catheterization with PCI in 02/2019 in setting of late stent thrombosis. Patient is on aspirin and Brilinta as an outpatient. Patient is also on Lipitor 80 mg as an outpatient. -Continue Brilinta/Aspirini  Severe malnutrition -Continue Ensure   DVT prophylaxis: Lovenox Code Status:   Code Status: DNR Family Communication: Called wife via telephone with no response Disposition Plan: Discharge possibly home with hospice.  Depending on goals of care, possible discharge within 1 to 3 days if able to be stable on outpatient oxygen regimen manageable by hospice.   Consultants:   Palliative care medicine  Procedures:   None  Antimicrobials:  Vancomycin  Zosyn  Unasyn  Remdesivir    Subjective: No issues. Patient does not want to be resuscitated if he was to be considered medically dead. He mentions that he wants a Industrial/product designer.  Objective: Vitals:   02/18/20 0800 02/18/20 1122 02/18/20 1456 02/18/20 1507  BP: 102/71 101/68  98/68  Pulse: 77 90  89  Resp: 12 16  15   Temp: 97.6 F (36.4 C) 98.1 F (36.7 C)  (!) 97.5 F (36.4 C)  TempSrc: Axillary Oral  Axillary  SpO2: 98% 97% 100% 100%  Weight:      Height:  Intake/Output Summary (Last 24 hours) at 02/18/2020 1718 Last data filed at 02/18/2020 0300 Gross per 24 hour  Intake 240 ml  Output 1150 ml  Net -910 ml   Filed Weights   02/16/20 0416 02/17/20 0400 02/18/20 0303  Weight: 66.6 kg 67.6 kg 68.1 kg    Examination:  General  exam: Appears calm and comfortable Respiratory system: Diminished on disposable stethoscope; mild rales heard. Respiratory effort normal. Cardiovascular system: S1 & S2 heard, RRR. No murmurs, rubs, gallops or clicks. Gastrointestinal system: Abdomen is nondistended, soft and nontender. No organomegaly or masses felt. Normal bowel sounds heard. Central nervous system: Alert and oriented. No focal neurological deficits. Musculoskeletal: No edema. No calf tenderness Skin: No cyanosis. No rashes Psychiatry: Judgement and insight appear normal. Flat affect    Data Reviewed: I have personally reviewed following labs and imaging studies  CBC Lab Results  Component Value Date   WBC 14.2 (H) 02/18/2020   RBC 3.17 (L) 02/18/2020   HGB 9.3 (L) 02/18/2020   HCT 26.5 (L) 02/18/2020   MCV 83.6 02/18/2020   MCH 29.3 02/18/2020   PLT 197 02/18/2020   MCHC 35.1 02/18/2020   RDW 16.7 (H) 02/18/2020   LYMPHSABS 0.6 (L) 02/14/2020   MONOABS 0.2 02/14/2020   EOSABS 0.0 02/14/2020   BASOSABS 0.0 29/52/8413     Last metabolic panel Lab Results  Component Value Date   NA 136 02/18/2020   K 3.9 02/18/2020   CL 99 02/18/2020   CO2 30 02/18/2020   BUN 35 (H) 02/18/2020   CREATININE 0.72 02/18/2020   GLUCOSE 121 (H) 02/18/2020   GFRNONAA >60 02/18/2020   GFRAA >60 02/18/2020   CALCIUM 8.1 (L) 02/18/2020   PHOS 3.0 02/18/2020   PROT 6.4 (L) 02/14/2020   ALBUMIN 2.5 (L) 02/14/2020   BILITOT 3.6 (H) 02/14/2020   ALKPHOS 303 (H) 02/14/2020   AST 134 (H) 02/14/2020   ALT 54 (H) 02/14/2020   ANIONGAP 7 02/18/2020    CBG (last 3)  No results for input(s): GLUCAP in the last 72 hours.   GFR: Estimated Creatinine Clearance: 76.8 mL/min (by C-G formula based on SCr of 0.72 mg/dL).  Coagulation Profile: No results for input(s): INR, PROTIME in the last 168 hours.  Recent Results (from the past 240 hour(s))  SARS Coronavirus 2 by RT PCR (hospital order, performed in [facility] hospital  lab) Nasopharyngeal Nasopharyngeal Swab     Status: Abnormal   Collection Time: 01/21/2020  2:05 PM   Specimen: Nasopharyngeal Swab  Result Value Ref Range Status   SARS Coronavirus 2 POSITIVE (A) NEGATIVE Final    Comment: RESULT CALLED TO, READ BACK BY AND VERIFIED WITH: AMY COWEN 02/02/2020 AT 1534 BY ACR (NOTE) SARS-CoV-2 target nucleic acids are DETECTED  SARS-CoV-2 RNA is generally detectable in upper respiratory specimens  during the acute phase of infection.  Positive results are indicative  of the presence of the identified virus, but do not rule out bacterial infection or co-infection with other pathogens not detected by the test.  Clinical correlation with patient history and  other diagnostic information is necessary to determine patient infection status.  The expected result is negative.  Fact Sheet for Patients:   StrictlyIdeas.no   Fact Sheet for Healthcare Providers:   BankingDealers.co.za    This test is not yet approved or cleared by the [place] FDA and  has been authorized for detection and/or diagnosis of SARS-CoV-2 by FDA under an Emergency Use Authorization (EUA).  This  EUA will remain in effect (meaning this test  can be used) for the duration of  the COVID-19 declaration under Section 564(b)(1) of the Act, 21 U.S.C. section 360-bbb-3(b)(1), unless the authorization is terminated or revoked sooner.  Performed at [facility], [address] [place], [place] [address]   Blood Culture (routine x 2)     Status: None   Collection Time: 01/31/2020  2:20 PM   Specimen: BLOOD  Result Value Ref Range Status   Specimen Description BLOOD BLOOD LEFT ARM  Final   Special Requests   Final    BOTTLES DRAWN AEROBIC AND ANAEROBIC Blood Culture adequate volume   Culture   Final    NO GROWTH 5 DAYS Performed at [facility], [address], [place], [place] [address]    Report Status 02/14/2020 FINAL   Final  Blood Culture (routine x 2)     Status: None   Collection Time: 02/06/2020  2:28 PM   Specimen: BLOOD  Result Value Ref Range Status   Specimen Description BLOOD RIGHT ANTECUBITAL  Final   Special Requests   Final    BOTTLES DRAWN AEROBIC AND ANAEROBIC Blood Culture adequate volume   Culture   Final    NO GROWTH 5 DAYS Performed at [facility], [address], [place], [place] [address]    Report Status 02/14/2020 FINAL  Final  Culture, blood (Routine X 2) w Reflex to ID Panel     Status: Abnormal   Collection Time: 02/11/20  2:19 PM   Specimen: BLOOD  Result Value Ref Range Status   Specimen Description   Final    BLOOD LEFT ANTECUBITAL Performed at [facility], [address], [place], [place] [address]    Special Requests   Final    BOTTLES DRAWN AEROBIC AND ANAEROBIC Blood Culture adequate volume Performed at [facility], [address] [place], [place] [address]    Culture  Setup Time   Final    AEROBIC BOTTLE ONLY GRAM POSITIVE RODS CRITICAL RESULT CALLED TO, READ BACK BY AND VERIFIED WITH: Garrard @1915  02/12/20 MJU Performed at [facility], [address] [place], [place] [address]    Culture (A)  Final    DIPHTHEROIDS(CORYNEBACTERIUM SPECIES) Standardized susceptibility testing for this organism is not available. Performed at [facility], [place] [address] [place], [place] [address]    Report Status 02/14/2020 FINAL  Final  Culture, blood (Routine X 2) w Reflex to ID Panel     Status: Abnormal   Collection Time: 02/11/20  2:24 PM   Specimen: BLOOD  Result Value Ref Range Status   Specimen Description   Final    BLOOD BLOOD LEFT HAND Performed at [facility], [address], [place], [place] [address]    Special Requests   Final    BOTTLES DRAWN AEROBIC AND ANAEROBIC Blood Culture adequate volume Performed at [facility], [address] [place], [place] [address]    Culture   Setup Time   Final    GRAM POSITIVE COCCI AEROBIC BOTTLE ONLY Organism ID to follow CRITICAL RESULT CALLED TO, READ BACK BY AND VERIFIED WITH: JASON ROBBINS AT 1607 ON 02/12/2020 [place]. Performed at [facility], [address] [place], [place] [address]    Culture (A)  Final    VIRIDANS STREPTOCOCCUS THE SIGNIFICANCE OF ISOLATING THIS ORGANISM FROM A SINGLE SET OF BLOOD CULTURES WHEN MULTIPLE SETS ARE DRAWN IS UNCERTAIN. PLEASE NOTIFY THE  MICROBIOLOGY DEPARTMENT WITHIN ONE WEEK IF SPECIATION AND SENSITIVITIES ARE REQUIRED. Performed at [facility], [place] [address] [place], [place] [address]    Report Status 02/15/2020 FINAL  Final  Blood Culture ID Panel (Reflexed)     Status: Abnormal   Collection Time: 02/11/20  2:24 PM  Result Value Ref Range Status   Enterococcus faecalis NOT DETECTED NOT DETECTED Final   Enterococcus Faecium NOT DETECTED NOT DETECTED Final   Listeria monocytogenes NOT DETECTED NOT DETECTED Final   Staphylococcus species NOT DETECTED NOT DETECTED Final   Staphylococcus aureus (BCID) NOT DETECTED NOT DETECTED Final   Staphylococcus epidermidis NOT DETECTED NOT DETECTED Final   Staphylococcus lugdunensis NOT DETECTED NOT DETECTED Final   Streptococcus species DETECTED (A) NOT DETECTED Final    Comment: Not Enterococcus species, Streptococcus agalactiae, Streptococcus pyogenes, or Streptococcus pneumoniae. CRITICAL RESULT CALLED TO, READ BACK BY AND VERIFIED WITH: JASON ROBBINS AT 0607 ON 02/12/2020 [place].    Streptococcus agalactiae NOT DETECTED NOT DETECTED Final   Streptococcus pneumoniae NOT DETECTED NOT DETECTED Final   Streptococcus pyogenes NOT DETECTED NOT DETECTED Final   A.calcoaceticus-baumannii NOT DETECTED NOT DETECTED Final   Bacteroides fragilis NOT DETECTED NOT DETECTED Final   Enterobacterales NOT DETECTED NOT DETECTED Final   Enterobacter cloacae complex NOT DETECTED NOT DETECTED Final   Escherichia coli NOT DETECTED NOT DETECTED  Final   Klebsiella aerogenes NOT DETECTED NOT DETECTED Final   Klebsiella oxytoca NOT DETECTED NOT DETECTED Final   Klebsiella pneumoniae NOT DETECTED NOT DETECTED Final   Proteus species NOT DETECTED NOT DETECTED Final   Salmonella species NOT DETECTED NOT DETECTED Final   Serratia marcescens NOT DETECTED NOT DETECTED Final   Haemophilus influenzae NOT DETECTED NOT DETECTED Final   Neisseria meningitidis NOT DETECTED NOT DETECTED Final   Pseudomonas aeruginosa NOT DETECTED NOT DETECTED Final   Stenotrophomonas maltophilia NOT DETECTED NOT DETECTED Final   Candida albicans NOT DETECTED NOT DETECTED Final   Candida auris NOT DETECTED NOT DETECTED Final   Candida glabrata NOT DETECTED NOT DETECTED Final   Candida krusei NOT DETECTED NOT DETECTED Final   Candida parapsilosis NOT DETECTED NOT DETECTED Final   Candida tropicalis NOT DETECTED NOT DETECTED Final   Cryptococcus neoformans/gattii NOT DETECTED NOT DETECTED Final    Comment: Performed at [facility], [address] [place], [place] [address]  Culture, blood (Routine X 2) w Reflex to ID Panel     Status: None   Collection Time: 02/13/20  9:51 AM   Specimen: BLOOD  Result Value Ref Range Status   Specimen Description BLOOD [facility]  Final   Special Requests BOTTLES DRAWN AEROBIC ONLY [facility]  Final   Culture   Final    NO GROWTH 5 DAYS Performed at [facility], [address] [place], [place] [address]    Report Status 02/18/2020 FINAL  Final  Culture, blood (Routine X 2) w Reflex to ID Panel     Status: None   Collection Time: 02/13/20  1:02 PM   Specimen: BLOOD  Result Value Ref Range Status   Specimen Description BLOOD BLOOD RIGHT HAND  Final   Special Requests   Final    BOTTLES DRAWN AEROBIC AND ANAEROBIC Blood Culture results may not be optimal due to an inadequate volume of blood received in culture bottles   Culture   Final    NO GROWTH 5 DAYS Performed at [facility], [address],  [place], [place] [address]  Report Status 02/18/2020 FINAL  Final        Radiology Studies: No results found.      Scheduled Meds: . albuterol  2 puff Inhalation Q6H  . vitamin C  500 mg Oral Daily  . aspirin EC  81 mg Oral Daily  . Chlorhexidine Gluconate Cloth  6 each Topical Q0600  . enoxaparin (LOVENOX) injection  40 mg Subcutaneous Q24H  . feeding supplement (ENSURE ENLIVE)  237 mL Oral TID BM  . midodrine  2.5 mg Oral TID WC  . multivitamin with minerals  1 tablet Oral Daily  . predniSONE  50 mg Oral Daily  . sodium chloride flush  3 mL Intravenous Q12H  . ticagrelor  90 mg Oral BID  . zinc sulfate  220 mg Oral Daily   Continuous Infusions:   LOS: 9 days     Cordelia Poche, MD [facility] 02/18/2020, 5:18 PM  If 7PM-7AM, please contact night-coverage [email or web address]

## 2020-02-18 NOTE — Progress Notes (Signed)
Daily Progress Note   Patient Name: Nicholas Schultz       Date: 02/18/2020 DOB: 1945/09/09  Age: 74 y.o. MRN#: [id number] Attending Physician: Mariel Aloe, MD Primary Care Physician: Patient, [name] Per Admit Date: 01/22/2020  Reason for Consultation/Follow-up: Establishing goals of care  Subjective: Patient awake and alert. Experienced some distress earlier this morning requiring non-rebreather in addition to 10L HFNC. Continues to tolerate with high 90s saturations. Complains of some fatigue. Wants to go home.  I attempted to discuss with the patient his wishes however he stated "what ever my wife says".  I spoke with wife via phone. We discussed at length options for continued aggressive care vs. Allowing patient to be comfortable with symptom management while hospitalized and/or at home with family and friends. Wife verbalizes understanding, however she states she is not ready to make any final decisions regarding hospice care.   She shares that she is remaining hopeful for some improvement. I provided detailed updates regarding his respiratory failure and the need for non-rebreather intermittently. She expressed understanding. Wife inquires about a repeat COVID test to see if patient is negative prior to coming home because of her daughter and grandchildren. Education provided on not repeating test as patient can test positive for weeks. Wife reports both she and her daughter retested and in 3 weeks of the their positive their results were negative.   Mrs. Muff expresses her hope that he would show some improvement and he could possibly follow up with his Cardiologist and have pacemaker placed and show some improvement in his quality of life.  I discussed at length with wife patient's significant respiratory failure emphasizing patient would not be a good candidate at this time for mentioned interventions due to instability.  She verbalized understanding expressing she was entertaining the  thought as mentioned by her daughter and neighbor who are cardiac nurses.  I encouraged Ms. Lupinski to continue with ongoing discussions as she seems confused with what decisions should be made on behalf of Mr. Mclean.  Mrs. Gandolfo reports she wants him to live and currently that is what she is focusing on and would like to allow him more time to show improvement but also expresses her heart feels that he would not do well and would not show signs of recovery.  She is somewhat tearful in conversation.  Therapeutic listening and emotional support provided.  I created space and opportunity for wife to express her feelings.  She continued to express wishes for patient not to suffer and wanting him to return home knowing that is what he really wanted.  Use this opportunity to further discuss patient's current full CODE STATUS with consideration of his current illness and comorbidities.  Mrs. Plair verbalizes her understanding of her husband's tenuous state and after extensive discussions with her children they are all in mutual agreement for patient to be a DNR/DNI.  Education provided to Mrs. Joslyn regarding what a DNR would look like in the care of patient.  She verbalized understanding and again confirms wishes for DNR/DNI.  All questions answered and support provided.  Length of Stay: 9 days  Vital Signs: BP 101/68 (BP Location: Left Arm)   Pulse 90   Temp 98.1 F (36.7 C) (Oral)   Resp 16   Ht 5\' 11"  (1.803 m)   Wt 68.1 kg   SpO2 97%   BMI 20.94 kg/m  SpO2: SpO2: 97 % O2 Device: O2 Device: NRB, High Flow Nasal Cannula O2 Flow  Rate: O2 Flow Rate (L/min): 10 L/min         Palliative Care Assessment & Plan   Code Status:  DNR  Goals of Care/Recommendations:  DNR/DNI-as requested and confirmed by wife  Continue with current medical plan per medical team.  Extensive discussion with wife and updates provided.  Wife reports she is not prepared to make any final decisions.  She expresses  she remains hopeful patient will show some symptoms of improvement/stability however also feels he would not make a full recovery.  Encourage wife to continue with important ongoing discussions with family making decisions that are centered on patient's quality of life and wishes.  PMT will continue to support and follow as needed.  Prognosis: Guarded-poor  Discharge Planning: To Be Determined  Thank you for allowing the Palliative Medicine Team to assist in the care of this patient.  Time Total: 50 min.   Visit consisted of counseling and education dealing with the complex and emotionally intense issues of symptom management and palliative care in the setting of serious and potentially life-threatening illness.Greater than 50%  of this time was spent counseling and coordinating care related to the above assessment and plan.  Alda Lea, AGPCNP-BC  Palliative Medicine Team [phone]

## 2020-02-19 LAB — CBC
HCT: 30.2 % — ABNORMAL LOW (ref 39.0–52.0)
Hemoglobin: 10.4 g/dL — ABNORMAL LOW (ref 13.0–17.0)
MCH: 29.1 pg (ref 26.0–34.0)
MCHC: 34.4 g/dL (ref 30.0–36.0)
MCV: 84.6 fL (ref 80.0–100.0)
Platelets: 249 10*3/uL (ref 150–400)
RBC: 3.57 MIL/uL — ABNORMAL LOW (ref 4.22–5.81)
RDW: 16.8 % — ABNORMAL HIGH (ref 11.5–15.5)
WBC: 20.4 10*3/uL — ABNORMAL HIGH (ref 4.0–10.5)
nRBC: 0 % (ref 0.0–0.2)

## 2020-02-19 LAB — PHOSPHORUS: Phosphorus: 2.9 mg/dL (ref 2.5–4.6)

## 2020-02-19 LAB — COMPREHENSIVE METABOLIC PANEL
ALT: 78 U/L — ABNORMAL HIGH (ref 0–44)
AST: 85 U/L — ABNORMAL HIGH (ref 15–41)
Albumin: 2.4 g/dL — ABNORMAL LOW (ref 3.5–5.0)
Alkaline Phosphatase: 233 U/L — ABNORMAL HIGH (ref 38–126)
Anion gap: 7 (ref 5–15)
BUN: 37 mg/dL — ABNORMAL HIGH (ref 8–23)
CO2: 32 mmol/L (ref 22–32)
Calcium: 8.7 mg/dL — ABNORMAL LOW (ref 8.9–10.3)
Chloride: 100 mmol/L (ref 98–111)
Creatinine, Ser: 0.86 mg/dL (ref 0.61–1.24)
GFR calc Af Amer: >60 mL/min (ref >60)
GFR calc non Af Amer: >60 mL/min (ref >60)
Glucose, Bld: 95 mg/dL (ref 70–99)
Potassium: 4.8 mmol/L (ref 3.5–5.1)
Sodium: 139 mmol/L (ref 135–145)
Total Bilirubin: 2 mg/dL — ABNORMAL HIGH (ref 0.3–1.2)
Total Protein: 6.6 g/dL (ref 6.5–8.1)

## 2020-02-19 LAB — CBC WITH DIFFERENTIAL/PLATELET
Abs Immature Granulocytes: 0.34 10*3/uL — ABNORMAL HIGH (ref 0.00–0.07)
Basophils Absolute: 0 10*3/uL (ref 0.0–0.1)
Basophils Relative: 0 %
Eosinophils Absolute: 0.1 10*3/uL (ref 0.0–0.5)
Eosinophils Relative: 0 %
HCT: 30.7 % — ABNORMAL LOW (ref 39.0–52.0)
Hemoglobin: 10.4 g/dL — ABNORMAL LOW (ref 13.0–17.0)
Immature Granulocytes: 2 %
Lymphocytes Relative: 4 %
Lymphs Abs: 0.8 10*3/uL (ref 0.7–4.0)
MCH: 29 pg (ref 26.0–34.0)
MCHC: 33.9 g/dL (ref 30.0–36.0)
MCV: 85.5 fL (ref 80.0–100.0)
Monocytes Absolute: 0.5 10*3/uL (ref 0.1–1.0)
Monocytes Relative: 2 %
Neutro Abs: 18.6 10*3/uL — ABNORMAL HIGH (ref 1.7–7.7)
Neutrophils Relative %: 92 %
Platelets: 243 10*3/uL (ref 150–400)
RBC: 3.59 MIL/uL — ABNORMAL LOW (ref 4.22–5.81)
RDW: 17.2 % — ABNORMAL HIGH (ref 11.5–15.5)
WBC: 20.4 10*3/uL — ABNORMAL HIGH (ref 4.0–10.5)
nRBC: 0.1 % (ref 0.0–0.2)

## 2020-02-19 LAB — FERRITIN: Ferritin: 1126 ng/mL — ABNORMAL HIGH (ref 24–336)

## 2020-02-19 LAB — FIBRIN DERIVATIVES D-DIMER (ARMC ONLY): Fibrin derivatives D-dimer (ARMC): >7500 ng/mL (FEU) — ABNORMAL HIGH (ref 0.00–499.00)

## 2020-02-19 LAB — MAGNESIUM: Magnesium: 2.3 mg/dL (ref 1.7–2.4)

## 2020-02-19 LAB — C-REACTIVE PROTEIN: CRP: 6.8 mg/dL — ABNORMAL HIGH (ref <1.0)

## 2020-02-19 NOTE — Progress Notes (Signed)
PROGRESS NOTE    Nicholas Schultz  TWS:[id number] DOB: November 24, 1945 DOA: 02/15/2020 PCP: Patient, [name] Per   Brief Narrative: Nicholas Schultz is a 74 y.o. malewith medical history significant forcoronary artery disease, heart failure with reduced EF(last known LVEF 30 to 35%),history of ischemic cardiomyopathy, nicotine dependence. Patient presented secondary to hypoxia in setting of COVID-19 infection. He was started on supportive care and treatment for COVID-19 pneumonia.   Assessment & Plan:   Principal Problem:   Acute respiratory failure due to COVID-19 [facility]) Active Problems:   Chronic systolic heart failure (HCC)   Lactic acidosis   Goals of care, counseling/discussion   Palliative care by specialist   Protein-calorie malnutrition, severe   Acute respiratory failure with hypoxia Secondary to Covid-19 pneumonia.  Patient has undergone CTA chest x2 with no evidence of acute PE. He is still requiring significant amount of oxygen and has been placed on NRB and HFNC. -Wean to room air as able however, goal is for management of hypoxia on [place] up to 15 Lpm -Keep O2 saturation >90%  Severe sepsis Secondary to COVID-19 pneumonia.  Patient received a short course of vancomycin and Zosyn which was transitioned to Unasyn.  Patient with 1 out of 4 positive blood cultures for Streptococcus viridans which was a likely contaminant in addition to diphtheroids.  COVID-19 pneumonia Patient with bilateral infiltrates seen on imaging.  Patient treated with a 5-day course of remdesivir which she has completed.  He was started on baricitinib which was discontinued after 4 doses.  He was initially managed on Solu-Medrol which has been transitioned to prednisone.  LDH, ferritin, CRP trending down. AST/ALT trended down slightly. D-dimer elevated today -Daily CMP, ferritin, CRP, D-dimer  Chronic systolic heart failure Last left ventricular EF of 30 to 35%.  Lasix initially held for hypotension.  He  received intermittent doses of Lasix while inpatient. Wife was inquiring about AICD; not realistic at this point. On chart review, it appears cardiology had recommended EP consultation for consideration of AICD for which the patient declined. Currently stable.  Metabolic acidosis Secondary to lactic acidosis. Resolved.  Leukocytosis In setting of infection and steroid use. Increase today. -Add differential  Anemia Appears to be acute since this admission with slow downtrend. Normocytic. Recent iron panel suggests anemia of critical illness. Will benefit from iron supplementation depending on disease course and goals of care. Does not appear to have evidence of active bleeding.  Coronary artery atherosclerosis History of MI Seen on CT angio chest. S/p last cardiac catheterization with PCI in 02/2019 in setting of late stent thrombosis. Patient is on aspirin and Brilinta as an outpatient. Patient is also on Lipitor 80 mg as an outpatient. -Continue Brilinta/Aspirini  Severe malnutrition -Continue Ensure   DVT prophylaxis: Lovenox Code Status:   Code Status: DNR Family Communication: Wife on telephone Disposition Plan: Discharge possibly home with hospice.  Depending on goals of care, possible discharge in several days if able to be stable on outpatient oxygen regimen manageable by hospice.   Consultants:   Palliative care medicine  Procedures:   None  Antimicrobials:  Vancomycin  Zosyn  Unasyn  Remdesivir    Subjective: No issues. Patient does not want to be resuscitated if he was to be considered medically dead. He mentions that he wants a Industrial/product designer.  Objective: Vitals:   02/19/20 0600 02/19/20 0747 02/19/20 1146 02/19/20 1216  BP: 96/79 (!) 87/68  100/72  Pulse: 91 99  (!) 106  Resp: 20 15  15  Temp: 97.8 F (36.6 C) (!) 97.3 F (36.3 C)  98 F (36.7 C)  TempSrc: Axillary Oral  Oral  SpO2: 100% 100% 100% 100%  Weight: 65.9 kg     Height:         Intake/Output Summary (Last 24 hours) at 02/19/2020 1351 Last data filed at 02/19/2020 1220 Gross per 24 hour  Intake --  Output 800 ml  Net -800 ml   Filed Weights   02/18/20 0303 02/19/20 0500 02/19/20 0600  Weight: 68.1 kg 66.8 kg 65.9 kg    Examination:  General exam: Appears calm and comfortable Respiratory system: Diminished on disposable stethoscope; mild rales heard. Respiratory effort normal. Cardiovascular system: S1 & S2 heard, RRR. No murmurs, rubs, gallops or clicks. Gastrointestinal system: Abdomen is nondistended, soft and nontender. No organomegaly or masses felt. Normal bowel sounds heard. Central nervous system: Alert and oriented. No focal neurological deficits. Musculoskeletal: No edema. No calf tenderness Skin: No cyanosis. No rashes Psychiatry: Judgement and insight appear normal. Flat affect    Data Reviewed: I have personally reviewed following labs and imaging studies  CBC Lab Results  Component Value Date   WBC 20.4 (H) 02/19/2020   RBC 3.57 (L) 02/19/2020   HGB 10.4 (L) 02/19/2020   HCT 30.2 (L) 02/19/2020   MCV 84.6 02/19/2020   MCH 29.1 02/19/2020   PLT 249 02/19/2020   MCHC 34.4 02/19/2020   RDW 16.8 (H) 02/19/2020   LYMPHSABS 0.6 (L) 02/14/2020   MONOABS 0.2 02/14/2020   EOSABS 0.0 02/14/2020   BASOSABS 0.0 76/81/1572     Last metabolic panel Lab Results  Component Value Date   NA 139 02/19/2020   K 4.8 02/19/2020   CL 100 02/19/2020   CO2 32 02/19/2020   BUN 37 (H) 02/19/2020   CREATININE 0.86 02/19/2020   GLUCOSE 95 02/19/2020   GFRNONAA >60 02/19/2020   GFRAA >60 02/19/2020   CALCIUM 8.7 (L) 02/19/2020   PHOS 2.9 02/19/2020   PROT 6.6 02/19/2020   ALBUMIN 2.4 (L) 02/19/2020   BILITOT 2.0 (H) 02/19/2020   ALKPHOS 233 (H) 02/19/2020   AST 85 (H) 02/19/2020   ALT 78 (H) 02/19/2020   ANIONGAP 7 02/19/2020    CBG (last 3)  No results for input(s): GLUCAP in the last 72 hours.   GFR: Estimated Creatinine  Clearance: 69.2 mL/min (by C-G formula based on SCr of 0.86 mg/dL).  Coagulation Profile: No results for input(s): INR, PROTIME in the last 168 hours.  Recent Results (from the past 240 hour(s))  SARS Coronavirus 2 by RT PCR (hospital order, performed in [facility] hospital lab) Nasopharyngeal Nasopharyngeal Swab     Status: Abnormal   Collection Time: 02/14/2020  2:05 PM   Specimen: Nasopharyngeal Swab  Result Value Ref Range Status   SARS Coronavirus 2 POSITIVE (A) NEGATIVE Final    Comment: RESULT CALLED TO, READ BACK BY AND VERIFIED WITH: AMY COWEN 02/13/2020 AT 1534 BY ACR (NOTE) SARS-CoV-2 target nucleic acids are DETECTED  SARS-CoV-2 RNA is generally detectable in upper respiratory specimens  during the acute phase of infection.  Positive results are indicative  of the presence of the identified virus, but do not rule out bacterial infection or co-infection with other pathogens not detected by the test.  Clinical correlation with patient history and  other diagnostic information is necessary to determine patient infection status.  The expected result is negative.  Fact Sheet for Patients:   StrictlyIdeas.no   Fact Sheet for  Healthcare Providers:   BankingDealers.co.za    This test is not yet approved or cleared by the [place] and  has been authorized for detection and/or diagnosis of SARS-CoV-2 by FDA under an Emergency Use Authorization (EUA).  This EUA will remain in effect (meaning this test  can be used) for the duration of  the COVID-19 declaration under Section 564(b)(1) of the Act, 21 U.S.C. section 360-bbb-3(b)(1), unless the authorization is terminated or revoked sooner.  Performed at [facility], [address] [place], [place] [address]   Blood Culture (routine x 2)     Status: None   Collection Time: 01/29/2020  2:20 PM   Specimen: BLOOD  Result Value Ref Range Status   Specimen Description  BLOOD BLOOD LEFT ARM  Final   Special Requests   Final    BOTTLES DRAWN AEROBIC AND ANAEROBIC Blood Culture adequate volume   Culture   Final    NO GROWTH 5 DAYS Performed at [facility], [address], [place], [place] [address]    Report Status 02/14/2020 FINAL  Final  Blood Culture (routine x 2)     Status: None   Collection Time: 02/02/2020  2:28 PM   Specimen: BLOOD  Result Value Ref Range Status   Specimen Description BLOOD RIGHT ANTECUBITAL  Final   Special Requests   Final    BOTTLES DRAWN AEROBIC AND ANAEROBIC Blood Culture adequate volume   Culture   Final    NO GROWTH 5 DAYS Performed at [facility], [address] [place], [place] [address]    Report Status 02/14/2020 FINAL  Final  Culture, blood (Routine X 2) w Reflex to ID Panel     Status: Abnormal   Collection Time: 02/11/20  2:19 PM   Specimen: BLOOD  Result Value Ref Range Status   Specimen Description   Final    BLOOD LEFT ANTECUBITAL Performed at [facility], [address] [place], [place] [address]    Special Requests   Final    BOTTLES DRAWN AEROBIC AND ANAEROBIC Blood Culture adequate volume Performed at [facility], [address] [place], [place] [address]    Culture  Setup Time   Final    AEROBIC BOTTLE ONLY GRAM POSITIVE RODS CRITICAL RESULT CALLED TO, READ BACK BY AND VERIFIED WITH: Lamar @1915  02/12/20 MJU Performed at [facility], [address] [place], [place] [address]    Culture (A)  Final    DIPHTHEROIDS(CORYNEBACTERIUM SPECIES) Standardized susceptibility testing for this organism is not available. Performed at [facility], [place] [address], [place], [place] [address]    Report Status 02/14/2020 FINAL  Final  Culture, blood (Routine X 2) w Reflex to ID Panel     Status: Abnormal   Collection Time: 02/11/20  2:24 PM   Specimen: BLOOD  Result Value Ref Range Status   Specimen Description   Final    BLOOD BLOOD LEFT  HAND Performed at [facility], [address] [place], [place] [address]    Special Requests   Final    BOTTLES DRAWN AEROBIC AND ANAEROBIC Blood Culture adequate volume Performed at [facility], [address] [place],  [address]    Culture  Setup Time   Final    GRAM POSITIVE COCCI AEROBIC BOTTLE ONLY Organism ID to follow CRITICAL RESULT CALLED TO, READ BACK BY AND VERIFIED WITH: JASON ROBBINS AT 4680 ON 02/12/2020 [place]. Performed at [facility],  [place], [place] [address]    Culture (A)  Final    VIRIDANS STREPTOCOCCUS THE SIGNIFICANCE OF ISOLATING THIS ORGANISM FROM A SINGLE SET OF BLOOD CULTURES WHEN MULTIPLE SETS ARE DRAWN IS UNCERTAIN. PLEASE NOTIFY THE MICROBIOLOGY DEPARTMENT WITHIN ONE WEEK IF SPECIATION AND SENSITIVITIES ARE REQUIRED. Performed at [facility], [place] [address] [place], [place] [address]    Report Status 02/15/2020 FINAL  Final  Blood Culture ID Panel (Reflexed)     Status: Abnormal   Collection Time: 02/11/20  2:24 PM  Result Value Ref Range Status   Enterococcus faecalis NOT DETECTED NOT DETECTED Final   Enterococcus Faecium NOT DETECTED NOT DETECTED Final   Listeria monocytogenes NOT DETECTED NOT DETECTED Final   Staphylococcus species NOT DETECTED NOT DETECTED Final   Staphylococcus aureus (BCID) NOT DETECTED NOT DETECTED Final   Staphylococcus epidermidis NOT DETECTED NOT DETECTED Final   Staphylococcus lugdunensis NOT DETECTED NOT DETECTED Final   Streptococcus species DETECTED (A) NOT DETECTED Final    Comment: Not Enterococcus species, Streptococcus agalactiae, Streptococcus pyogenes, or Streptococcus pneumoniae. CRITICAL RESULT CALLED TO, READ BACK BY AND VERIFIED WITH: JASON ROBBINS AT 0607 ON 02/12/2020 [place].    Streptococcus agalactiae NOT DETECTED NOT DETECTED Final   Streptococcus pneumoniae NOT DETECTED NOT DETECTED Final   Streptococcus pyogenes NOT DETECTED NOT DETECTED Final    A.calcoaceticus-baumannii NOT DETECTED NOT DETECTED Final   Bacteroides fragilis NOT DETECTED NOT DETECTED Final   Enterobacterales NOT DETECTED NOT DETECTED Final   Enterobacter cloacae complex NOT DETECTED NOT DETECTED Final   Escherichia coli NOT DETECTED NOT DETECTED Final   Klebsiella aerogenes NOT DETECTED NOT DETECTED Final   Klebsiella oxytoca NOT DETECTED NOT DETECTED Final   Klebsiella pneumoniae NOT DETECTED NOT DETECTED Final   Proteus species NOT DETECTED NOT DETECTED Final   Salmonella species NOT DETECTED NOT DETECTED Final   Serratia marcescens NOT DETECTED NOT DETECTED Final   Haemophilus influenzae NOT DETECTED NOT DETECTED Final   Neisseria meningitidis NOT DETECTED NOT DETECTED Final   Pseudomonas aeruginosa NOT DETECTED NOT DETECTED Final   Stenotrophomonas maltophilia NOT DETECTED NOT DETECTED Final   Candida albicans NOT DETECTED NOT DETECTED Final   Candida auris NOT DETECTED NOT DETECTED Final   Candida glabrata NOT DETECTED NOT DETECTED Final   Candida krusei NOT DETECTED NOT DETECTED Final   Candida parapsilosis NOT DETECTED NOT DETECTED Final   Candida tropicalis NOT DETECTED NOT DETECTED Final   Cryptococcus neoformans/gattii NOT DETECTED NOT DETECTED Final    Comment: Performed at [facility], [address] [place], [place] [address]  Culture, blood (Routine X 2) w Reflex to ID Panel     Status: None   Collection Time: 02/13/20  9:51 AM   Specimen: BLOOD  Result Value Ref Range Status   Specimen Description BLOOD [facility]  Final   Special Requests BOTTLES DRAWN AEROBIC ONLY [facility]  Final   Culture   Final    NO GROWTH 5 DAYS Performed at [facility], [address] [place], [place] [address]    Report Status 02/18/2020 FINAL  Final  Culture, blood (Routine X 2) w Reflex to ID Panel     Status: None   Collection Time: 02/13/20  1:02 PM   Specimen: BLOOD  Result Value Ref Range Status   Specimen Description BLOOD BLOOD RIGHT HAND   Final   Special Requests   Final    BOTTLES DRAWN AEROBIC AND ANAEROBIC Blood Culture results may  not be optimal due to an inadequate volume of blood received in culture bottles   Culture   Final    NO GROWTH 5 DAYS Performed at [facility], [address] [place], [place] [address]    Report Status 02/18/2020 FINAL  Final        Radiology Studies: No results found.      Scheduled Meds: . albuterol  2 puff Inhalation Q6H  . vitamin C  500 mg Oral Daily  . aspirin EC  81 mg Oral Daily  . Chlorhexidine Gluconate Cloth  6 each Topical Q0600  . enoxaparin (LOVENOX) injection  40 mg Subcutaneous Q24H  . feeding supplement (ENSURE ENLIVE)  237 mL Oral TID BM  . midodrine  2.5 mg Oral TID WC  . multivitamin with minerals  1 tablet Oral Daily  . predniSONE  50 mg Oral Daily  . sodium chloride flush  3 mL Intravenous Q12H  . ticagrelor  90 mg Oral BID  . zinc sulfate  220 mg Oral Daily   Continuous Infusions:   LOS: 10 days     Cordelia Poche, MD [facility] 02/19/2020, 1:51 PM  If 7PM-7AM, please contact night-coverage [email or web address]

## 2020-02-19 NOTE — Progress Notes (Signed)
Nutrition Follow-up  DOCUMENTATION CODES:   Severe malnutrition in context of chronic illness  INTERVENTION:  D/c Ensure - pt ordered Honey Thick fluid consistency  Continue Magic cup TID with meals, each supplement provides 290 kcal and 9 grams of protein  Continue MVI with minerals daily  Mighty Shake on meal tray (pending dietary availability), each supplement provides 220 kcal and 6 grams of protein  Consider appetite stimulant  If appropriate, consider placement of NG tube for nutrition support  NUTRITION DIAGNOSIS:   Severe Malnutrition related to chronic illness (CHF) as evidenced by severe fat depletion, moderate muscle depletion, severe muscle depletion. -ongoing  GOAL:   Patient will meet greater than or equal to 90% of their needs -unmet; addressing via oral nutrition supplements  MONITOR:   PO intake, Supplement acceptance, Labs, Weight trends, I & O's, Skin  REASON FOR ASSESSMENT:   Malnutrition Screening Tool    ASSESSMENT:   74 year old male with PMHx of CAD, chronic systolic CHF (LVEF 47-82%) admitted with NFAOZ-30 PNA, metabolic acidosis, anemia.  Pt still requiring significant amount to oxygen on NRB and HFNC.   Pt continues with poor po intake, eating 0-20% of meals and weights have trended down ~14 lbs over the past 7 days; significant. Diet downgraded to dysphagia 1 with honey thickened liquids on 9/28, will discontinue Ensure supplement as this is a thin liquid. Mighty Shakes ordered on meal trays and will continue Magic Cup with meals. If appropriate with goals of care, recommend consideration of placing NG tube for nutrition support. Per notes possible discharge home with hospice if able to be stable on outpatient oxygen regimen.   Medications reviewed and include: Vit C, MVI, Prednisone, Zinc sulfate  Labs: WBC 20.4 (H)  Diet Order:   Diet Order            DIET - DYS 1 Room service appropriate? Yes with Assist; Fluid consistency: Honey  Thick  Diet effective now                 EDUCATION NEEDS:   No education needs have been identified at this time  Skin:  Skin Assessment: Skin Integrity Issues: (MASD to anus)  Last BM:  9/28  Height:   Ht Readings from Last 1 Encounters:  02/02/2020 5\' 11"  (1.803 m)    Weight:   Wt Readings from Last 1 Encounters:  02/19/20 65.9 kg    Ideal Body Weight:  78.2 kg  BMI:  Body mass index is 20.26 kg/m.  Estimated Nutritional Needs:   Kcal:  1900-2100  Protein:  95-105 grams  Fluid:  1.8-2 L/day   Lajuan Lines, RD, LDN Clinical Nutrition After Hours/Weekend Pager # in [place]

## 2020-02-20 DIAGNOSIS — I5022 Chronic systolic (congestive) heart failure: Secondary | ICD-10-CM | POA: Diagnosis not present

## 2020-02-20 DIAGNOSIS — E43 Unspecified severe protein-calorie malnutrition: Secondary | ICD-10-CM | POA: Diagnosis not present

## 2020-02-20 DIAGNOSIS — J9600 Acute respiratory failure, unspecified whether with hypoxia or hypercapnia: Secondary | ICD-10-CM | POA: Diagnosis not present

## 2020-02-20 DIAGNOSIS — U071 COVID-19: Secondary | ICD-10-CM | POA: Diagnosis not present

## 2020-02-20 LAB — URINALYSIS, COMPLETE (UACMP) WITH MICROSCOPIC
Bacteria, UA: NONE SEEN
Bilirubin Urine: NEGATIVE
Glucose, UA: NEGATIVE mg/dL
Hgb urine dipstick: NEGATIVE
Ketones, ur: NEGATIVE mg/dL
Leukocytes,Ua: NEGATIVE
Nitrite: NEGATIVE
Protein, ur: NEGATIVE mg/dL
Specific Gravity, Urine: 1.024 (ref 1.005–1.030)
Squamous Epithelial / HPF: NONE SEEN (ref 0–5)
pH: 5 (ref 5.0–8.0)

## 2020-02-20 LAB — COMPREHENSIVE METABOLIC PANEL
ALT: 71 U/L — ABNORMAL HIGH (ref 0–44)
AST: 72 U/L — ABNORMAL HIGH (ref 15–41)
Albumin: 2 g/dL — ABNORMAL LOW (ref 3.5–5.0)
Alkaline Phosphatase: 178 U/L — ABNORMAL HIGH (ref 38–126)
Anion gap: 6 (ref 5–15)
BUN: 33 mg/dL — ABNORMAL HIGH (ref 8–23)
CO2: 31 mmol/L (ref 22–32)
Calcium: 7.9 mg/dL — ABNORMAL LOW (ref 8.9–10.3)
Chloride: 100 mmol/L (ref 98–111)
Creatinine, Ser: 0.74 mg/dL (ref 0.61–1.24)
GFR calc Af Amer: >60 mL/min (ref >60)
GFR calc non Af Amer: >60 mL/min (ref >60)
Glucose, Bld: 102 mg/dL — ABNORMAL HIGH (ref 70–99)
Potassium: 3.9 mmol/L (ref 3.5–5.1)
Sodium: 137 mmol/L (ref 135–145)
Total Bilirubin: 1.4 mg/dL — ABNORMAL HIGH (ref 0.3–1.2)
Total Protein: 5.4 g/dL — ABNORMAL LOW (ref 6.5–8.1)

## 2020-02-20 LAB — FIBRIN DERIVATIVES D-DIMER (ARMC ONLY): Fibrin derivatives D-dimer (ARMC): 6147.69 ng/mL (FEU) — ABNORMAL HIGH (ref 0.00–499.00)

## 2020-02-20 LAB — C-REACTIVE PROTEIN: CRP: 5.7 mg/dL — ABNORMAL HIGH (ref <1.0)

## 2020-02-20 LAB — GLUCOSE, CAPILLARY: Glucose-Capillary: 223 mg/dL — ABNORMAL HIGH (ref 70–99)

## 2020-02-20 LAB — FERRITIN: Ferritin: 803 ng/mL — ABNORMAL HIGH (ref 24–336)

## 2020-02-20 NOTE — Progress Notes (Signed)
PROGRESS NOTE    Nicholas Schultz  FGH:[id number] DOB: Nov 04, 1945 DOA: 02/06/2020 PCP: Patient, [name] Per   Brief Narrative: Nicholas Schultz is a 74 y.o. malewith medical history significant forcoronary artery disease, heart failure with reduced EF(last known LVEF 30 to 35%),history of ischemic cardiomyopathy, nicotine dependence. Patient presented secondary to hypoxia in setting of COVID-19 infection. He was started on supportive care and treatment for COVID-19 pneumonia.   Assessment & Plan:   Principal Problem:   Acute respiratory failure due to COVID-19 [facility]) Active Problems:   Chronic systolic heart failure (HCC)   Lactic acidosis   Goals of care, counseling/discussion   Palliative care by specialist   Protein-calorie malnutrition, severe   Acute respiratory failure with hypoxia Secondary to Covid-19 pneumonia.  Patient has undergone CTA chest x2 with no evidence of acute PE. He is still requiring significant amount of oxygen and has been placed on NRB and HFNC. -Wean to room air as able however, goal is for management of hypoxia on [place] up to 15 Lpm; needs to come off of NRB -Keep O2 saturation >90%  Severe sepsis Secondary to COVID-19 pneumonia.  Patient received a short course of vancomycin and Zosyn which was transitioned to Unasyn.  Patient with 1 out of 4 positive blood cultures for Streptococcus viridans which was a likely contaminant in addition to diphtheroids.  COVID-19 pneumonia Patient with bilateral infiltrates seen on imaging.  Patient treated with a 5-day course of remdesivir which she has completed.  He was started on baricitinib which was discontinued after 4 doses.  He was initially managed on Solu-Medrol which has been transitioned to prednisone.  LDH, ferritin, CRP trending down. AST/ALT trended down slightly. D-dimer now trending down. -Daily CMP, ferritin, CRP, D-dimer  Chronic systolic heart failure Last left ventricular EF of 30 to 35%.  Lasix initially  held for hypotension.  He received intermittent doses of Lasix while inpatient. Wife was inquiring about AICD; not realistic at this point. On chart review, it appears cardiology had recommended EP consultation for consideration of AICD for which the patient declined. Currently stable.  Metabolic acidosis Secondary to lactic acidosis. Resolved.  Leukocytosis In setting of infection and steroid use. Stable and elevated -procalcitonin; if elevated will consider treatment for bacterial superinfection.  Anemia Appears to be acute since this admission and stable. Normocytic. Recent iron panel suggests anemia of critical illness. Will benefit from iron supplementation depending on disease course and goals of care. Does not appear to have evidence of active bleeding.  Coronary artery atherosclerosis History of MI Seen on CT angio chest. S/p last cardiac catheterization with PCI in 02/2019 in setting of late stent thrombosis. Patient is on aspirin and Brilinta as an outpatient. Patient is also on Lipitor 80 mg as an outpatient. -Continue Brilinta/Aspirin  Severe malnutrition -Continue Ensure  Pink urine Possible hematuria. Patient has a urinary foley catheter -Urinalysis with microscopy; if concern for UTI, will obtain urine culture.   DVT prophylaxis: Lovenox Code Status:   Code Status: DNR Family Communication: Wife on telephone Disposition Plan: Discharge possibly home with hospice.  Discharge home with hospice pending ability to wean off of NRB   Consultants:   Palliative care medicine  Procedures:   None  Antimicrobials:  Vancomycin  Zosyn  Unasyn  Remdesivir    Subjective: Patient is indifferent today.  Objective: Vitals:   02/20/20 0200 02/20/20 0236 02/20/20 0600 02/20/20 1401  BP: 93/66  105/77   Pulse: 91  81   Resp: 14  14   Temp:      TempSrc:      SpO2:  99%  100%  Weight:      Height:        Intake/Output Summary (Last 24 hours) at 02/20/2020  1426 Last data filed at 02/20/2020 0030 Gross per 24 hour  Intake --  Output 700 ml  Net -700 ml   Filed Weights   02/18/20 0303 02/19/20 0500 02/19/20 0600  Weight: 68.1 kg 66.8 kg 65.9 kg    Examination:  General exam: Appears calm and comfortable Respiratory system: Clear to auscultation. Respiratory effort normal. Cardiovascular system: S1 & S2 heard, RRR. No murmurs, rubs, gallops or clicks. Gastrointestinal system: Abdomen is nondistended, soft and nontender. No organomegaly or masses felt. Normal bowel sounds heard. Central nervous system: Alert and oriented. No focal neurological deficits. Musculoskeletal: No edema. No calf tenderness Skin: No cyanosis. No rashes Psychiatry: Judgement and insight appear normal. Depressed mood, flat affect    Data Reviewed: I have personally reviewed following labs and imaging studies  CBC Lab Results  Component Value Date   WBC 20.4 (H) 02/19/2020   WBC 20.4 (H) 02/19/2020   RBC 3.57 (L) 02/19/2020   RBC 3.59 (L) 02/19/2020   HGB 10.4 (L) 02/19/2020   HGB 10.4 (L) 02/19/2020   HCT 30.2 (L) 02/19/2020   HCT 30.7 (L) 02/19/2020   MCV 84.6 02/19/2020   MCV 85.5 02/19/2020   MCH 29.1 02/19/2020   MCH 29.0 02/19/2020   PLT 249 02/19/2020   PLT 243 02/19/2020   MCHC 34.4 02/19/2020   MCHC 33.9 02/19/2020   RDW 16.8 (H) 02/19/2020   RDW 17.2 (H) 02/19/2020   LYMPHSABS 0.8 02/19/2020   MONOABS 0.5 02/19/2020   EOSABS 0.1 02/19/2020   BASOSABS 0.0 76/54/6503     Last metabolic panel Lab Results  Component Value Date   NA 137 02/20/2020   K 3.9 02/20/2020   CL 100 02/20/2020   CO2 31 02/20/2020   BUN 33 (H) 02/20/2020   CREATININE 0.74 02/20/2020   GLUCOSE 102 (H) 02/20/2020   GFRNONAA >60 02/20/2020   GFRAA >60 02/20/2020   CALCIUM 7.9 (L) 02/20/2020   PHOS 2.9 02/19/2020   PROT 5.4 (L) 02/20/2020   ALBUMIN 2.0 (L) 02/20/2020   BILITOT 1.4 (H) 02/20/2020   ALKPHOS 178 (H) 02/20/2020   AST 72 (H) 02/20/2020   ALT  71 (H) 02/20/2020   ANIONGAP 6 02/20/2020    CBG (last 3)  No results for input(s): GLUCAP in the last 72 hours.   GFR: Estimated Creatinine Clearance: 74.4 mL/min (by C-G formula based on SCr of 0.74 mg/dL).  Coagulation Profile: No results for input(s): INR, PROTIME in the last 168 hours.  Recent Results (from the past 240 hour(s))  Culture, blood (Routine X 2) w Reflex to ID Panel     Status: Abnormal   Collection Time: 02/11/20  2:19 PM   Specimen: BLOOD  Result Value Ref Range Status   Specimen Description   Final    BLOOD LEFT ANTECUBITAL Performed at [facility], [address], [place], [place] [address]    Special Requests   Final    BOTTLES DRAWN AEROBIC AND ANAEROBIC Blood Culture adequate volume Performed at [facility], [address] [place], [place] [address]    Culture  Setup Time   Final    AEROBIC BOTTLE ONLY GRAM POSITIVE RODS CRITICAL RESULT CALLED TO, READ BACK BY AND VERIFIED WITH: Tift @1915   02/12/20 MJU Performed at [facility], [address] [place], [place] [address]    Culture (A)  Final    DIPHTHEROIDS(CORYNEBACTERIUM SPECIES) Standardized susceptibility testing for this organism is not available. Performed at [facility], [place] [address], [place], [place] [address]    Report Status 02/14/2020 FINAL  Final  Culture, blood (Routine X 2) w Reflex to ID Panel     Status: Abnormal   Collection Time: 02/11/20  2:24 PM   Specimen: BLOOD  Result Value Ref Range Status   Specimen Description   Final    BLOOD BLOOD LEFT HAND Performed at [facility], [address], [place], [place] [address]    Special Requests   Final    BOTTLES DRAWN AEROBIC AND ANAEROBIC Blood Culture adequate volume Performed at [facility], [address] [place], [place] [address]    Culture  Setup Time   Final    GRAM POSITIVE COCCI AEROBIC BOTTLE ONLY Organism ID to follow CRITICAL RESULT CALLED  TO, READ BACK BY AND VERIFIED WITH: JASON ROBBINS AT 6599 ON 02/12/2020 [place]. Performed at [facility], [address] [place], [place] [address]    Culture (A)  Final    VIRIDANS STREPTOCOCCUS THE SIGNIFICANCE OF ISOLATING THIS ORGANISM FROM A SINGLE SET OF BLOOD CULTURES WHEN MULTIPLE SETS ARE DRAWN IS UNCERTAIN. PLEASE NOTIFY THE MICROBIOLOGY DEPARTMENT WITHIN ONE WEEK IF SPECIATION AND SENSITIVITIES ARE REQUIRED. Performed at [facility], [place] [address] [place], [place] [address]    Report Status 02/15/2020 FINAL  Final  Blood Culture ID Panel (Reflexed)     Status: Abnormal   Collection Time: 02/11/20  2:24 PM  Result Value Ref Range Status   Enterococcus faecalis NOT DETECTED NOT DETECTED Final   Enterococcus Faecium NOT DETECTED NOT DETECTED Final   Listeria monocytogenes NOT DETECTED NOT DETECTED Final   Staphylococcus species NOT DETECTED NOT DETECTED Final   Staphylococcus aureus (BCID) NOT DETECTED NOT DETECTED Final   Staphylococcus epidermidis NOT DETECTED NOT DETECTED Final   Staphylococcus lugdunensis NOT DETECTED NOT DETECTED Final   Streptococcus species DETECTED (A) NOT DETECTED Final    Comment: Not Enterococcus species, Streptococcus agalactiae, Streptococcus pyogenes, or Streptococcus pneumoniae. CRITICAL RESULT CALLED TO, READ BACK BY AND VERIFIED WITH: JASON ROBBINS AT 0607 ON 02/12/2020 [place].    Streptococcus agalactiae NOT DETECTED NOT DETECTED Final   Streptococcus pneumoniae NOT DETECTED NOT DETECTED Final   Streptococcus pyogenes NOT DETECTED NOT DETECTED Final   A.calcoaceticus-baumannii NOT DETECTED NOT DETECTED Final   Bacteroides fragilis NOT DETECTED NOT DETECTED Final   Enterobacterales NOT DETECTED NOT DETECTED Final   Enterobacter cloacae complex NOT DETECTED NOT DETECTED Final   Escherichia coli NOT DETECTED NOT DETECTED Final   Klebsiella aerogenes NOT DETECTED NOT DETECTED Final   Klebsiella oxytoca NOT DETECTED NOT DETECTED  Final   Klebsiella pneumoniae NOT DETECTED NOT DETECTED Final   Proteus species NOT DETECTED NOT DETECTED Final   Salmonella species NOT DETECTED NOT DETECTED Final   Serratia marcescens NOT DETECTED NOT DETECTED Final   Haemophilus influenzae NOT DETECTED NOT DETECTED Final   Neisseria meningitidis NOT DETECTED NOT DETECTED Final   Pseudomonas aeruginosa NOT DETECTED NOT DETECTED Final   Stenotrophomonas maltophilia NOT DETECTED NOT DETECTED Final   Candida albicans NOT DETECTED NOT DETECTED Final   Candida auris NOT DETECTED NOT DETECTED Final   Candida glabrata NOT DETECTED NOT DETECTED Final   Candida krusei NOT DETECTED NOT DETECTED Final  Candida parapsilosis NOT DETECTED NOT DETECTED Final   Candida tropicalis NOT DETECTED NOT DETECTED Final   Cryptococcus neoformans/gattii NOT DETECTED NOT DETECTED Final    Comment: Performed at [facility], [address] [place], [place] [address]  Culture, blood (Routine X 2) w Reflex to ID Panel     Status: None   Collection Time: 02/13/20  9:51 AM   Specimen: BLOOD  Result Value Ref Range Status   Specimen Description BLOOD [facility] [place]  Final   Special Requests BOTTLES DRAWN AEROBIC ONLY [facility]  Final   Culture   Final    NO GROWTH 5 DAYS Performed at [facility], [address] [place], [place] [address]    Report Status 02/18/2020 FINAL  Final  Culture, blood (Routine X 2) w Reflex to ID Panel     Status: None   Collection Time: 02/13/20  1:02 PM   Specimen: BLOOD  Result Value Ref Range Status   Specimen Description BLOOD BLOOD RIGHT HAND  Final   Special Requests   Final    BOTTLES DRAWN AEROBIC AND ANAEROBIC Blood Culture results may not be optimal due to an inadequate volume of blood received in culture bottles   Culture   Final    NO GROWTH 5 DAYS Performed at [facility], [address] [place], [place] [address]    Report Status 02/18/2020 FINAL  Final        Radiology Studies: No results  found.      Scheduled Meds: . albuterol  2 puff Inhalation Q6H  . vitamin C  500 mg Oral Daily  . aspirin EC  81 mg Oral Daily  . Chlorhexidine Gluconate Cloth  6 each Topical Q0600  . enoxaparin (LOVENOX) injection  40 mg Subcutaneous Q24H  . midodrine  2.5 mg Oral TID WC  . multivitamin with minerals  1 tablet Oral Daily  . predniSONE  50 mg Oral Daily  . sodium chloride flush  3 mL Intravenous Q12H  . ticagrelor  90 mg Oral BID  . zinc sulfate  220 mg Oral Daily   Continuous Infusions:   LOS: 11 days     Cordelia Poche, MD [facility] 02/20/2020, 2:26 PM  If 7PM-7AM, please contact night-coverage [email or web address]

## 2020-02-20 DEATH — deceased

## 2020-02-21 DIAGNOSIS — U071 COVID-19: Secondary | ICD-10-CM | POA: Diagnosis not present

## 2020-02-21 DIAGNOSIS — I5022 Chronic systolic (congestive) heart failure: Secondary | ICD-10-CM | POA: Diagnosis not present

## 2020-02-21 DIAGNOSIS — J9600 Acute respiratory failure, unspecified whether with hypoxia or hypercapnia: Secondary | ICD-10-CM | POA: Diagnosis not present

## 2020-02-21 DIAGNOSIS — E43 Unspecified severe protein-calorie malnutrition: Secondary | ICD-10-CM | POA: Diagnosis not present

## 2020-02-21 LAB — COMPREHENSIVE METABOLIC PANEL
ALT: 82 U/L — ABNORMAL HIGH (ref 0–44)
AST: 74 U/L — ABNORMAL HIGH (ref 15–41)
Albumin: 2.2 g/dL — ABNORMAL LOW (ref 3.5–5.0)
Alkaline Phosphatase: 192 U/L — ABNORMAL HIGH (ref 38–126)
Anion gap: 7 (ref 5–15)
BUN: 27 mg/dL — ABNORMAL HIGH (ref 8–23)
CO2: 31 mmol/L (ref 22–32)
Calcium: 8.5 mg/dL — ABNORMAL LOW (ref 8.9–10.3)
Chloride: 99 mmol/L (ref 98–111)
Creatinine, Ser: 0.81 mg/dL (ref 0.61–1.24)
GFR calc Af Amer: >60 mL/min (ref >60)
GFR calc non Af Amer: >60 mL/min (ref >60)
Glucose, Bld: 149 mg/dL — ABNORMAL HIGH (ref 70–99)
Potassium: 4.3 mmol/L (ref 3.5–5.1)
Sodium: 137 mmol/L (ref 135–145)
Total Bilirubin: 1.7 mg/dL — ABNORMAL HIGH (ref 0.3–1.2)
Total Protein: 6.3 g/dL — ABNORMAL LOW (ref 6.5–8.1)

## 2020-02-21 LAB — C-REACTIVE PROTEIN: CRP: 7 mg/dL — ABNORMAL HIGH (ref <1.0)

## 2020-02-21 LAB — FIBRIN DERIVATIVES D-DIMER (ARMC ONLY): Fibrin derivatives D-dimer (ARMC): 5349.15 ng/mL (FEU) — ABNORMAL HIGH (ref 0.00–499.00)

## 2020-02-21 LAB — CBC
HCT: 27.7 % — ABNORMAL LOW (ref 39.0–52.0)
Hemoglobin: 9.5 g/dL — ABNORMAL LOW (ref 13.0–17.0)
MCH: 29.7 pg (ref 26.0–34.0)
MCHC: 34.3 g/dL (ref 30.0–36.0)
MCV: 86.6 fL (ref 80.0–100.0)
Platelets: 224 10*3/uL (ref 150–400)
RBC: 3.2 MIL/uL — ABNORMAL LOW (ref 4.22–5.81)
RDW: 18 % — ABNORMAL HIGH (ref 11.5–15.5)
WBC: 19.3 10*3/uL — ABNORMAL HIGH (ref 4.0–10.5)
nRBC: 0 % (ref 0.0–0.2)

## 2020-02-21 LAB — PROCALCITONIN: Procalcitonin: 0.1 ng/mL

## 2020-02-21 LAB — FERRITIN: Ferritin: 862 ng/mL — ABNORMAL HIGH (ref 24–336)

## 2020-02-21 MED ORDER — LORAZEPAM 2 MG/ML IJ SOLN
1.0000 mg | Freq: Once | INTRAMUSCULAR | Status: AC
  Administered 2020-02-21: 1 mg via INTRAVENOUS
  Filled 2020-02-21: qty 1

## 2020-02-21 MED ORDER — SODIUM CHLORIDE 0.9 % IV BOLUS
500.0000 mL | Freq: Once | INTRAVENOUS | Status: AC
  Administered 2020-02-21: 500 mL via INTRAVENOUS

## 2020-02-21 NOTE — Progress Notes (Signed)
MD Nettey placed orders for patient to be transferred to Covid Unit when bed placement available today. MD would like for patient to be weaned off NRB in anticipation of discharge home with Hospice (Go home on [place] only).

## 2020-02-21 NOTE — Progress Notes (Addendum)
PROGRESS NOTE    Nicholas Schultz  WER:[id number] DOB: 05/14/1946 DOA: 02/17/2020 PCP: Patient, [name] Per   Brief Narrative: Nicholas Schultz is a 74 y.o. malewith medical history significant forcoronary artery disease, heart failure with reduced EF(last known LVEF 30 to 35%),history of ischemic cardiomyopathy, nicotine dependence. Patient presented secondary to hypoxia in setting of COVID-19 infection. He was started on supportive care and treatment for COVID-19 pneumonia.   Assessment & Plan:   Principal Problem:   Acute respiratory failure due to COVID-19 [facility]) Active Problems:   Chronic systolic heart failure (HCC)   Lactic acidosis   Goals of care, counseling/discussion   Palliative care by specialist   Protein-calorie malnutrition, severe   Acute respiratory failure with hypoxia Secondary to Covid-19 pneumonia.  Patient has undergone CTA chest x2 with no evidence of acute PE. He is still requiring significant amount of oxygen and has been placed on NRB and HFNC. -Wean to room air as able however, goal is for management of hypoxia on [place] up to 15 Lpm; needs to come off of NRB -Keep O2 saturation >90%  Severe sepsis Secondary to COVID-19 pneumonia.  Patient received a short course of vancomycin and Zosyn which was transitioned to Unasyn.  Patient with 1 out of 4 positive blood cultures for Streptococcus viridans which was a likely contaminant in addition to diphtheroids.  COVID-19 pneumonia Patient with bilateral infiltrates seen on imaging.  Patient treated with a 5-day course of remdesivir which she has completed.  He was started on baricitinib which was discontinued after 4 doses.  He was initially managed on Solu-Medrol which has been transitioned to prednisone.  LDH, ferritin, CRP trending down. AST/ALT trended down slightly. D-dimer now trending down. -Daily CMP, ferritin, CRP, D-dimer  Chronic systolic heart failure Last left ventricular EF of 30 to 35%.  Lasix initially  held for hypotension.  He received intermittent doses of Lasix while inpatient. Wife was inquiring about AICD; not realistic at this point. On chart review, it appears cardiology had recommended EP consultation for consideration of AICD for which the patient declined. Currently stable.  Chronic hypotension -Continue midodrine -Give 500 mL NS bolus to improve ability to get labwork today  Metabolic acidosis Secondary to lactic acidosis. Resolved.  Leukocytosis In setting of infection and steroid use. Stable and elevated -procalcitonin pending; if elevated will consider treatment for bacterial superinfection.  Anemia Appears to be acute since this admission and stable. Normocytic. Recent iron panel suggests anemia of critical illness. Will benefit from iron supplementation depending on disease course and goals of care. Does not appear to have evidence of active bleeding.  Coronary artery atherosclerosis History of MI Seen on CT angio chest. S/p last cardiac catheterization with PCI in 02/2019 in setting of late stent thrombosis. Patient is on aspirin and Brilinta as an outpatient. Patient is also on Lipitor 80 mg as an outpatient. -Continue Brilinta/Aspirin  Severe malnutrition -Continue Ensure  Pink urine Possible hematuria. Urinalysis without gross or microscopic hematuria. Urine appears normal now.   DVT prophylaxis: Lovenox Code Status:   Code Status: DNR Family Communication: Wife on telephone Disposition Plan: Discharge home with hospice pending ability to wean off of NRB. Unknown timeframe.   Consultants:   Palliative care medicine  Procedures:   None  Antimicrobials:  Vancomycin  Zosyn  Unasyn  Remdesivir    Subjective: No issues. Breathing is about the same.  Objective: Vitals:   02/20/20 1401 02/20/20 2034 02/21/20 0744 02/21/20 0829  BP:  115/79 90/72  91/74  Pulse:  97 (!) 104 95  Resp:  18 15 17   Temp:  97.7 F (36.5 C) 97.9 F (36.6 C) 97.9  F (36.6 C)  TempSrc:  Oral Axillary Oral  SpO2: 100% 100% 100% 100%  Weight:      Height:        Intake/Output Summary (Last 24 hours) at 02/21/2020 1043 Last data filed at 02/20/2020 2000 Gross per 24 hour  Intake --  Output 775 ml  Net -775 ml   Filed Weights   02/18/20 0303 02/19/20 0500 02/19/20 0600  Weight: 68.1 kg 66.8 kg 65.9 kg    Examination:  General exam: Appears calm and comfortable Respiratory system: Mildly coarse but good air movement. Respiratory effort normal. Cardiovascular system: S1 & S2 heard, RRR. No murmurs, rubs, gallops or clicks. Gastrointestinal system: Abdomen is nondistended, soft and nontender. No organomegaly or masses felt. Normal bowel sounds heard. Central nervous system: Alert and oriented. No focal neurological deficits. Musculoskeletal: No edema. No calf tenderness Skin: No cyanosis. No rashes Psychiatry: Judgement and insight appear normal. Depressed mood and flat affect    Data Reviewed: I have personally reviewed following labs and imaging studies  CBC Lab Results  Component Value Date   WBC 20.4 (H) 02/19/2020   WBC 20.4 (H) 02/19/2020   RBC 3.57 (L) 02/19/2020   RBC 3.59 (L) 02/19/2020   HGB 10.4 (L) 02/19/2020   HGB 10.4 (L) 02/19/2020   HCT 30.2 (L) 02/19/2020   HCT 30.7 (L) 02/19/2020   MCV 84.6 02/19/2020   MCV 85.5 02/19/2020   MCH 29.1 02/19/2020   MCH 29.0 02/19/2020   PLT 249 02/19/2020   PLT 243 02/19/2020   MCHC 34.4 02/19/2020   MCHC 33.9 02/19/2020   RDW 16.8 (H) 02/19/2020   RDW 17.2 (H) 02/19/2020   LYMPHSABS 0.8 02/19/2020   MONOABS 0.5 02/19/2020   EOSABS 0.1 02/19/2020   BASOSABS 0.0 75/02/2584     Last metabolic panel Lab Results  Component Value Date   NA 137 02/20/2020   K 3.9 02/20/2020   CL 100 02/20/2020   CO2 31 02/20/2020   BUN 33 (H) 02/20/2020   CREATININE 0.74 02/20/2020   GLUCOSE 102 (H) 02/20/2020   GFRNONAA >60 02/20/2020   GFRAA >60 02/20/2020   CALCIUM 7.9 (L)  02/20/2020   PHOS 2.9 02/19/2020   PROT 5.4 (L) 02/20/2020   ALBUMIN 2.0 (L) 02/20/2020   BILITOT 1.4 (H) 02/20/2020   ALKPHOS 178 (H) 02/20/2020   AST 72 (H) 02/20/2020   ALT 71 (H) 02/20/2020   ANIONGAP 6 02/20/2020    CBG (last 3)  Recent Labs    02/20/20 2304  GLUCAP 223*     GFR: Estimated Creatinine Clearance: 74.4 mL/min (by C-G formula based on SCr of 0.74 mg/dL).  Coagulation Profile: No results for input(s): INR, PROTIME in the last 168 hours.  Recent Results (from the past 240 hour(s))  Culture, blood (Routine X 2) w Reflex to ID Panel     Status: Abnormal   Collection Time: 02/11/20  2:19 PM   Specimen: BLOOD  Result Value Ref [place] Status   Specimen Description   Final    BLOOD LEFT ANTECUBITAL Performed at [facility], [address], [place], [place] [address]    Special Requests   Final    BOTTLES DRAWN AEROBIC AND ANAEROBIC Blood Culture adequate volume Performed at [facility], [address], [place], [place] [address]    Culture  Setup Time   Final    AEROBIC BOTTLE ONLY GRAM POSITIVE RODS CRITICAL RESULT CALLED TO, READ BACK BY AND VERIFIED WITH: [place] @1915  02/12/20 MJU Performed at [facility], [address] [place], [place] [address]    Culture (A)  Final    DIPHTHEROIDS(CORYNEBACTERIUM SPECIES) Standardized susceptibility testing for this organism is not available. Performed at [facility], [place] [address] [place], [place] [address]    Report Status 02/14/2020 FINAL  Final  Culture, blood (Routine X 2) w Reflex to ID Panel     Status: Abnormal   Collection Time: 02/11/20  2:24 PM   Specimen: BLOOD  Result Value Ref [place] Status   Specimen Description   Final    BLOOD BLOOD LEFT HAND Performed at [facility], [address] [place], [place] [address]    Special Requests   Final    BOTTLES DRAWN AEROBIC AND ANAEROBIC Blood Culture adequate volume Performed at [facility], [address] [place], [place] [address]    Culture  Setup Time   Final    GRAM POSITIVE COCCI AEROBIC BOTTLE ONLY Organism ID to follow CRITICAL RESULT CALLED TO, READ BACK BY AND VERIFIED WITH: JASON ROBBINS AT 7858 ON 02/12/2020 [place]. Performed at [facility], [address] [place], [place] [address]    Culture (A)  Final    VIRIDANS STREPTOCOCCUS THE SIGNIFICANCE OF ISOLATING THIS ORGANISM FROM A SINGLE SET OF BLOOD CULTURES WHEN MULTIPLE SETS ARE DRAWN IS UNCERTAIN. PLEASE NOTIFY THE MICROBIOLOGY DEPARTMENT WITHIN ONE WEEK IF SPECIATION AND SENSITIVITIES ARE REQUIRED. Performed at [facility], [place] [address] [place],  [address]    Report Status 02/15/2020 FINAL  Final  Blood Culture ID Panel (Reflexed)     Status: Abnormal   Collection Time: 02/11/20  2:24 PM  Result Value Ref [place] Status   Enterococcus faecalis NOT DETECTED NOT DETECTED Final   Enterococcus Faecium NOT DETECTED NOT DETECTED Final   Listeria monocytogenes NOT DETECTED NOT DETECTED Final   Staphylococcus species NOT DETECTED NOT DETECTED Final   Staphylococcus aureus (BCID) NOT DETECTED NOT DETECTED Final   Staphylococcus epidermidis NOT DETECTED NOT DETECTED Final   Staphylococcus lugdunensis NOT DETECTED NOT DETECTED Final   Streptococcus species DETECTED (A) NOT DETECTED Final    Comment: Not Enterococcus species, Streptococcus agalactiae, Streptococcus pyogenes, or Streptococcus pneumoniae. CRITICAL RESULT CALLED TO, READ BACK BY AND VERIFIED WITH: JASON ROBBINS AT 0607 ON 02/12/2020 [place].    Streptococcus agalactiae NOT DETECTED NOT DETECTED Final   Streptococcus pneumoniae NOT DETECTED NOT DETECTED Final   Streptococcus pyogenes NOT DETECTED NOT DETECTED Final   A.calcoaceticus-baumannii NOT DETECTED NOT DETECTED Final   Bacteroides fragilis NOT DETECTED NOT DETECTED Final   Enterobacterales NOT DETECTED NOT DETECTED Final   Enterobacter cloacae complex NOT DETECTED NOT  DETECTED Final   Escherichia coli NOT DETECTED NOT DETECTED Final   Klebsiella aerogenes NOT DETECTED NOT DETECTED Final   Klebsiella oxytoca NOT DETECTED NOT DETECTED Final   Klebsiella pneumoniae NOT DETECTED NOT DETECTED Final   Proteus species NOT DETECTED NOT DETECTED Final   Salmonella species NOT DETECTED NOT DETECTED Final   Serratia marcescens NOT DETECTED NOT DETECTED Final   Haemophilus influenzae NOT DETECTED NOT DETECTED Final   Neisseria meningitidis NOT DETECTED NOT DETECTED Final   Pseudomonas aeruginosa NOT DETECTED NOT DETECTED Final   Stenotrophomonas maltophilia NOT DETECTED NOT DETECTED Final   Candida albicans NOT DETECTED NOT DETECTED Final  Candida auris NOT DETECTED NOT DETECTED Final   Candida glabrata NOT DETECTED NOT DETECTED Final   Candida krusei NOT DETECTED NOT DETECTED Final   Candida parapsilosis NOT DETECTED NOT DETECTED Final   Candida tropicalis NOT DETECTED NOT DETECTED Final   Cryptococcus neoformans/gattii NOT DETECTED NOT DETECTED Final    Comment: Performed at [facility], [address] [place], [place] [address]  Culture, blood (Routine X 2) w Reflex to ID Panel     Status: None   Collection Time: 02/13/20  9:51 AM   Specimen: BLOOD  Result Value Ref [place] Status   Specimen Description BLOOD [facility]  Final   Special Requests BOTTLES DRAWN AEROBIC ONLY [facility]  Final   Culture   Final    NO GROWTH 5 DAYS Performed at [facility], [address] [place], [place] [address]    Report Status 02/18/2020 FINAL  Final  Culture, blood (Routine X 2) w Reflex to ID Panel     Status: None   Collection Time: 02/13/20  1:02 PM   Specimen: BLOOD  Result Value Ref [place] Status   Specimen Description BLOOD BLOOD RIGHT HAND  Final   Special Requests   Final    BOTTLES DRAWN AEROBIC AND ANAEROBIC Blood Culture results may not be optimal due to an inadequate volume of blood received in culture bottles   Culture   Final    NO GROWTH 5  DAYS Performed at [facility], [address] [place], [place] [address]    Report Status 02/18/2020 FINAL  Final        Radiology Studies: No results found.      Scheduled Meds: . albuterol  2 puff Inhalation Q6H  . vitamin C  500 mg Oral Daily  . aspirin EC  81 mg Oral Daily  . Chlorhexidine Gluconate Cloth  6 each Topical Q0600  . enoxaparin (LOVENOX) injection  40 mg Subcutaneous Q24H  . midodrine  2.5 mg Oral TID WC  . multivitamin with minerals  1 tablet Oral Daily  . predniSONE  50 mg Oral Daily  . sodium chloride flush  3 mL Intravenous Q12H  . ticagrelor  90 mg Oral BID  . zinc sulfate  220 mg Oral Daily   Continuous Infusions:   LOS: 12 days     Cordelia Poche, MD [facility] 02/21/2020, 10:43 AM  If 7PM-7AM, please contact night-coverage [email or web address]

## 2020-02-21 NOTE — Progress Notes (Signed)
   02/21/20 0744  Assess: MEWS Score  Temp 97.9 F (36.6 C)  BP 90/72  Pulse Rate (!) 104  ECG Heart Rate (!) 110  Resp 15  SpO2 100 %  Assess: MEWS Score  MEWS Temp 0  MEWS Systolic 1  MEWS Pulse 1  MEWS RR 0  MEWS LOC 0  MEWS Score 2  MEWS Score Color Yellow  Assess: if the MEWS score is Yellow or Red  Were vital signs taken at a resting state? Yes  Focused Assessment Change from prior assessment (see assessment flowsheet)  Early Detection of Sepsis Score *See Row Information* Low  MEWS guidelines implemented *See Row Information* Yes  Treat  MEWS Interventions Escalated (See documentation below)  Take Vital Signs  Increase Vital Sign Frequency  Yellow: Q 2hr X 2 then Q 4hr X 2, if remains yellow, continue Q 4hrs  Escalate  MEWS: Escalate Yellow: discuss with charge nurse/RN and consider discussing with provider and RRT  Notify: Charge Nurse/RN  Name of Charge Nurse/RN Notified [place]  Date Charge Nurse/RN Notified 02/21/20  Time Charge Nurse/RN Notified [phone]  Document  Patient Outcome Other (Comment) (Cotntinuing to monitor )  Progress note created (see row info) Yes

## 2020-02-21 NOTE — Progress Notes (Addendum)
Lab tech notified me that she and morning tech were both unable to get a draw from patient.

## 2020-02-22 ENCOUNTER — Inpatient Hospital Stay: Payer: Medicare HMO

## 2020-02-22 DIAGNOSIS — E43 Unspecified severe protein-calorie malnutrition: Secondary | ICD-10-CM | POA: Diagnosis not present

## 2020-02-22 DIAGNOSIS — J9600 Acute respiratory failure, unspecified whether with hypoxia or hypercapnia: Secondary | ICD-10-CM | POA: Diagnosis not present

## 2020-02-22 DIAGNOSIS — I5022 Chronic systolic (congestive) heart failure: Secondary | ICD-10-CM | POA: Diagnosis not present

## 2020-02-22 DIAGNOSIS — U071 COVID-19: Secondary | ICD-10-CM | POA: Diagnosis not present

## 2020-02-22 LAB — COMPREHENSIVE METABOLIC PANEL
ALT: 70 U/L — ABNORMAL HIGH (ref 0–44)
AST: 69 U/L — ABNORMAL HIGH (ref 15–41)
Albumin: 2.1 g/dL — ABNORMAL LOW (ref 3.5–5.0)
Alkaline Phosphatase: 162 U/L — ABNORMAL HIGH (ref 38–126)
Anion gap: 7 (ref 5–15)
BUN: 30 mg/dL — ABNORMAL HIGH (ref 8–23)
CO2: 30 mmol/L (ref 22–32)
Calcium: 8.3 mg/dL — ABNORMAL LOW (ref 8.9–10.3)
Chloride: 103 mmol/L (ref 98–111)
Creatinine, Ser: 0.71 mg/dL (ref 0.61–1.24)
GFR calc Af Amer: >60 mL/min (ref >60)
GFR calc non Af Amer: >60 mL/min (ref >60)
Glucose, Bld: 116 mg/dL — ABNORMAL HIGH (ref 70–99)
Potassium: 4.2 mmol/L (ref 3.5–5.1)
Sodium: 140 mmol/L (ref 135–145)
Total Bilirubin: 1.7 mg/dL — ABNORMAL HIGH (ref 0.3–1.2)
Total Protein: 6 g/dL — ABNORMAL LOW (ref 6.5–8.1)

## 2020-02-22 LAB — FERRITIN: Ferritin: 864 ng/mL — ABNORMAL HIGH (ref 24–336)

## 2020-02-22 LAB — C-REACTIVE PROTEIN: CRP: 6.8 mg/dL — ABNORMAL HIGH (ref <1.0)

## 2020-02-22 LAB — CBC
HCT: 25.8 % — ABNORMAL LOW (ref 39.0–52.0)
Hemoglobin: 8.5 g/dL — ABNORMAL LOW (ref 13.0–17.0)
MCH: 29.5 pg (ref 26.0–34.0)
MCHC: 32.9 g/dL (ref 30.0–36.0)
MCV: 89.6 fL (ref 80.0–100.0)
Platelets: 194 10*3/uL (ref 150–400)
RBC: 2.88 MIL/uL — ABNORMAL LOW (ref 4.22–5.81)
RDW: 18.6 % — ABNORMAL HIGH (ref 11.5–15.5)
WBC: 19.6 10*3/uL — ABNORMAL HIGH (ref 4.0–10.5)
nRBC: 0 % (ref 0.0–0.2)

## 2020-02-22 LAB — PROCALCITONIN: Procalcitonin: 0.2 ng/mL

## 2020-02-22 LAB — FIBRIN DERIVATIVES D-DIMER (ARMC ONLY): Fibrin derivatives D-dimer (ARMC): 5230.62 ng/mL (FEU) — ABNORMAL HIGH (ref 0.00–499.00)

## 2020-02-22 MED ORDER — FLUMAZENIL 0.5 MG/5ML IV SOLN
0.2000 mg | Freq: Once | INTRAVENOUS | Status: AC
  Administered 2020-02-22: 0.2 mg via INTRAVENOUS
  Filled 2020-02-22: qty 2

## 2020-02-22 NOTE — Progress Notes (Signed)
PROGRESS NOTE    Nicholas Schultz  BJY:[id number] DOB: [birth year] DOA: 02/13/2020 PCP: Patient, [name] Per   Brief Narrative: Mcihael Schultz is a 74 y.o. malewith medical history significant forcoronary artery disease, heart failure with reduced EF(last known LVEF 30 to 35%),history of ischemic cardiomyopathy, nicotine dependence. Patient presented secondary to hypoxia in setting of COVID-19 infection. He was started on supportive care and treatment for COVID-19 pneumonia.   Assessment & Plan:   Principal Problem:   Acute respiratory failure due to COVID-19 [facility]) Active Problems:   Chronic systolic heart failure (HCC)   Lactic acidosis   Goals of care, counseling/discussion   Palliative care by specialist   Protein-calorie malnutrition, severe   Acute respiratory failure with hypoxia Secondary to Covid-19 pneumonia.  Patient has undergone CTA chest x2 with no evidence of acute PE. He is still requiring significant amount of oxygen and has been placed on NRB and HFNC. -Wean to room air as able however, goal is for management of hypoxia on [place] up to 15 Lpm; needs to come off of NRB -Keep O2 saturation >90%  Lethargy In setting of 1 mg Ativan. Patient with increased respiratory effort this morning as well. Chest x-ray and flumazenil ordered. On reevaluation, respiratory effort is improved.  Severe sepsis Secondary to COVID-19 pneumonia.  Patient received a short course of vancomycin and Zosyn which was transitioned to Unasyn.  Patient with 1 out of 4 positive blood cultures for Streptococcus viridans which was a likely contaminant in addition to diphtheroids.  COVID-19 pneumonia Patient with bilateral infiltrates seen on imaging.  Patient treated with a 5-day course of remdesivir which she has completed.  He was started on baricitinib which was discontinued after 4 doses.  He was initially managed on Solu-Medrol which has been transitioned to prednisone.  LDH, ferritin, CRP trending down.  AST/ALT trended down slightly. D-dimer now trending down. -Daily CMP, ferritin, CRP, D-dimer  Chronic systolic heart failure Last left ventricular EF of 30 to 35%.  Lasix initially held for hypotension.  He received intermittent doses of Lasix while inpatient. Wife was inquiring about AICD; not realistic at this point. On chart review, it appears cardiology had recommended EP consultation for consideration of AICD for which the patient declined. Currently stable.  Chronic hypotension -Continue midodrine -Give 500 mL NS bolus to improve ability to get labwork today  Metabolic acidosis Secondary to lactic acidosis. Resolved.  Leukocytosis In setting of infection and steroid use. Stable and elevated -procalcitonin pending; if elevated will consider treatment for bacterial superinfection.  Anemia Appears to be acute since this admission and stable. Normocytic. Recent iron panel suggests anemia of critical illness. Will benefit from iron supplementation depending on disease course and goals of care. Does not appear to have evidence of active bleeding.  Coronary artery atherosclerosis History of MI Seen on CT angio chest. S/p last cardiac catheterization with PCI in 02/2019 in setting of late stent thrombosis. Patient is on aspirin and Brilinta as an outpatient. Patient is also on Lipitor 80 mg as an outpatient. -Continue Brilinta/Aspirin  Severe malnutrition -Continue Ensure  Pink urine Possible hematuria. Urinalysis without gross or microscopic hematuria. Urine appears normal now.   DVT prophylaxis: Lovenox Code Status:   Code Status: DNR Family Communication: Wife on telephone Disposition Plan: Discharge home with hospice pending ability to wean off of NRB. Unknown timeframe.   Consultants:   Palliative care medicine  Procedures:   None  Antimicrobials:  Vancomycin  Zosyn  Unasyn  Remdesivir  Subjective: Lethargic today.  Objective: Vitals:   02/22/20 0310  02/22/20 0400 02/22/20 0700 02/22/20 0736  BP:  92/72  (!) 80/65  Pulse:  (!) 101 (!) 119 (!) 104  Resp:  14 (!) 29 16  Temp:  (!) 97.5 F (36.4 C)  98.1 F (36.7 C)  TempSrc:  Oral  Axillary  SpO2: 100% 95% 95% 97%  Weight:  65.1 kg    Height:        Intake/Output Summary (Last 24 hours) at 02/22/2020 1022 Last data filed at 02/21/2020 1615 Gross per 24 hour  Intake 500 ml  Output 650 ml  Net -150 ml   Filed Weights   02/19/20 0500 02/19/20 0600 02/22/20 0400  Weight: 66.8 kg 65.9 kg 65.1 kg    Examination:  General exam: Appears calm and comfortable Respiratory system: Clear to auscultation. Increased respiratory effort with retractions and tachypnea noted. Cardiovascular system: S1 & S2 heard, RRR. No murmurs, rubs, gallops or clicks. Gastrointestinal system: Abdomen is nondistended, soft and nontender. No organomegaly or masses felt. Normal bowel sounds heard. Central nervous system: Lethargic but arouses briefly. Musculoskeletal: No edema. No calf tenderness Skin: No cyanosis. No rashes Psychiatry: Judgement and insight appear impaired.    Data Reviewed: I have personally reviewed following labs and imaging studies  CBC Lab Results  Component Value Date   WBC 19.6 (H) 02/22/2020   RBC 2.88 (L) 02/22/2020   HGB 8.5 (L) 02/22/2020   HCT 25.8 (L) 02/22/2020   MCV 89.6 02/22/2020   MCH 29.5 02/22/2020   PLT 194 02/22/2020   MCHC 32.9 02/22/2020   RDW 18.6 (H) 02/22/2020   LYMPHSABS 0.8 02/19/2020   MONOABS 0.5 02/19/2020   EOSABS 0.1 02/19/2020   BASOSABS 0.0 86/76/7209     Last metabolic panel Lab Results  Component Value Date   NA 140 02/22/2020   K 4.2 02/22/2020   CL 103 02/22/2020   CO2 30 02/22/2020   BUN 30 (H) 02/22/2020   CREATININE 0.71 02/22/2020   GLUCOSE 116 (H) 02/22/2020   GFRNONAA >60 02/22/2020   GFRAA >60 02/22/2020   CALCIUM 8.3 (L) 02/22/2020   PHOS 2.9 02/19/2020   PROT 6.0 (L) 02/22/2020   ALBUMIN 2.1 (L) 02/22/2020    BILITOT 1.7 (H) 02/22/2020   ALKPHOS 162 (H) 02/22/2020   AST 69 (H) 02/22/2020   ALT 70 (H) 02/22/2020   ANIONGAP 7 02/22/2020    CBG (last 3)  Recent Labs    02/20/20 2304  GLUCAP 223*     GFR: Estimated Creatinine Clearance: 73.5 mL/min (by C-G formula based on SCr of 0.71 mg/dL).  Coagulation Profile: No results for input(s): INR, PROTIME in the last 168 hours.  Recent Results (from the past 240 hour(s))  Culture, blood (Routine X 2) w Reflex to ID Panel     Status: None   Collection Time: 02/13/20  9:51 AM   Specimen: BLOOD  Result Value Ref [place] Status   Specimen Description BLOOD [facility]  Final   Special Requests BOTTLES DRAWN AEROBIC ONLY [facility]  Final   Culture   Final    NO GROWTH 5 DAYS Performed at [facility], [address], [place], [place] [address]    Report Status 02/18/2020 FINAL  Final  Culture, blood (Routine X 2) w Reflex to ID Panel     Status: None   Collection Time: 02/13/20  1:02 PM   Specimen: BLOOD  Result Value Ref [place] Status   Specimen Description BLOOD  BLOOD RIGHT HAND  Final   Special Requests   Final    BOTTLES DRAWN AEROBIC AND ANAEROBIC Blood Culture results may not be optimal due to an inadequate volume of blood received in culture bottles   Culture   Final    NO GROWTH 5 DAYS Performed at [facility], [address] [place], [place] [address]    Report Status 02/18/2020 FINAL  Final        Radiology Studies: No results found.      Scheduled Meds: . albuterol  2 puff Inhalation Q6H  . vitamin C  500 mg Oral Daily  . aspirin EC  81 mg Oral Daily  . Chlorhexidine Gluconate Cloth  6 each Topical Q0600  . enoxaparin (LOVENOX) injection  40 mg Subcutaneous Q24H  . flumazenil  0.2 mg Intravenous Once  . midodrine  2.5 mg Oral TID WC  . multivitamin with minerals  1 tablet Oral Daily  . predniSONE  50 mg Oral Daily  . sodium chloride flush  3 mL Intravenous Q12H  . ticagrelor  90 mg Oral BID  . zinc  sulfate  220 mg Oral Daily   Continuous Infusions:   LOS: 13 days     Cordelia Poche, MD [facility] 02/22/2020, 10:22 AM  If 7PM-7AM, please contact night-coverage [email or web address]

## 2020-02-22 NOTE — Progress Notes (Signed)
Patient unable to take any oral medications this morning. Too drowsy and unable to arouse to acceptable level of coherence. MD Nettey notified and asked if meds could change to IV. This is a change in status from yesterday where patient was alert. Night shift reported a one time dose of ativan given around midnight for elevated agitation in patient. Patient currently on HFNC and NRB mask. Destats to high 70s-low80s when remove NRB.

## 2020-02-22 NOTE — Progress Notes (Signed)
   02/22/20 0700  Assess: MEWS Score  Pulse Rate (!) 119  ECG Heart Rate (!) 123  Resp (!) 29  SpO2 95 %  Assess: MEWS Score  MEWS Temp 0  MEWS Systolic 1  MEWS Pulse 2  MEWS RR 2  MEWS LOC 0  MEWS Score 5  MEWS Score Color Red  Assess: if the MEWS score is Yellow or Red  Were vital signs taken at a resting state? Yes  Focused Assessment Change from prior assessment (see assessment flowsheet)  Early Detection of Sepsis Score *See Row Information* Low  MEWS guidelines implemented *See Row Information* Yes  Treat  MEWS Interventions Other (Comment) (vitals rechecked within hour, turned yellow )  Take Vital Signs  Increase Vital Sign Frequency  Yellow: Q 2hr X 2 then Q 4hr X 2, if remains yellow, continue Q 4hrs  Escalate  MEWS: Escalate Yellow: discuss with charge nurse/RN and consider discussing with provider and RRT  Notify: Charge Nurse/RN  Name of Charge Nurse/RN Notified Linard Millers  Date Charge Nurse/RN Notified 02/22/20  Time Charge Nurse/RN Notified 0725  Document  Patient Outcome Other (Comment) (Cont to monitor/recheked vitals )

## 2020-02-22 NOTE — Progress Notes (Signed)
Patient continues to be drowsy but arousable.  Unable to take PO medications due to poor attention and cognition. Mouthcare and pericare completed on patient. Patient does not appear to be in acute stress/pain. Will continue to monitor frequently for needs/assistance.

## 2020-02-22 NOTE — Progress Notes (Signed)
   02/22/20 0400  Assess: MEWS Score  Temp (!) 97.5 F (36.4 C)  BP 92/72  Pulse Rate (!) 101  ECG Heart Rate (!) 106  Resp 14  SpO2 95 %  Assess: MEWS Score  MEWS Temp 0  MEWS Systolic 1  MEWS Pulse 1  MEWS RR 0  MEWS LOC 0  MEWS Score 2  MEWS Score Color Yellow  Assess: if the MEWS score is Yellow or Red  Were vital signs taken at a resting state? Yes  Focused Assessment No change from prior assessment  Early Detection of Sepsis Score *See Row Information* Low  MEWS guidelines implemented *See Row Information* No, previously yellow, continue vital signs every 4 hours  Treat  MEWS Interventions Other (Comment)  Escalate  MEWS: Escalate Yellow: discuss with charge nurse/RN and consider discussing with provider and RRT  Notify: Charge Nurse/RN  Name of Charge Nurse/RN Notified [place]  Date Charge Nurse/RN Notified 02/22/20  Time Charge Nurse/RN Notified 0630  Document  Patient Outcome Other (Comment) (will continue monitoring)  Progress note created (see row info) Yes

## 2020-02-22 NOTE — Progress Notes (Signed)
   02/22/20 1947  Assess: MEWS Score  Temp 98 F (36.7 C)  BP 93/70  Pulse Rate (!) 111  ECG Heart Rate (!) 111  Resp 13  Level of Consciousness Alert  SpO2 100 %  O2 Device HFNC;Non-rebreather Mask  O2 Flow Rate (L/min) 15 L/min  Assess: MEWS Score  MEWS Temp 0  MEWS Systolic 1  MEWS Pulse 2  MEWS RR 1  MEWS LOC 0  MEWS Score 4  MEWS Score Color Red  Assess: if the MEWS score is Yellow or Red  Were vital signs taken at a resting state? Yes  Focused Assessment No change from prior assessment  Early Detection of Sepsis Score *See Row Information* Low  Take Vital Signs  Increase Vital Sign Frequency  Yellow: Q 2hr X 2 then Q 4hr X 2, if remains yellow, continue Q 4hrs  Escalate  MEWS: Escalate Yellow: discuss with charge nurse/RN and consider discussing with provider and RRT  Notify: Charge Nurse/RN  Name of Charge Nurse/RN Notified Marcene Brawn   Date Charge Nurse/RN Notified 02/22/20  Time Charge Nurse/RN Notified 1947  Notify: Provider  Provider Name/Title Ouma NP  Date Provider Notified 02/22/20  Time Provider Notified 1947  Notification Type Face-to-face  Notification Reason Change in status  Response No new orders  Date of Provider Response 02/22/20  Time of Provider Response 1947

## 2020-02-22 NOTE — Plan of Care (Signed)
  Problem: Education: Goal: Knowledge of risk factors and measures for prevention of condition will improve Outcome: Progressing   Problem: Coping: Goal: Psychosocial and spiritual needs will be supported Outcome: Progressing   Problem: Respiratory: Goal: Will maintain a patent airway Outcome: Progressing Goal: Complications related to the disease process, condition or treatment will be avoided or minimized Outcome: Progressing   

## 2020-02-23 DIAGNOSIS — U071 COVID-19: Secondary | ICD-10-CM | POA: Diagnosis not present

## 2020-02-23 DIAGNOSIS — J9601 Acute respiratory failure with hypoxia: Secondary | ICD-10-CM | POA: Diagnosis not present

## 2020-02-23 DIAGNOSIS — I5022 Chronic systolic (congestive) heart failure: Secondary | ICD-10-CM | POA: Diagnosis not present

## 2020-02-23 DIAGNOSIS — Z7189 Other specified counseling: Secondary | ICD-10-CM | POA: Diagnosis not present

## 2020-02-23 LAB — COMPREHENSIVE METABOLIC PANEL
ALT: 73 U/L — ABNORMAL HIGH (ref 0–44)
AST: 71 U/L — ABNORMAL HIGH (ref 15–41)
Albumin: 2.4 g/dL — ABNORMAL LOW (ref 3.5–5.0)
Alkaline Phosphatase: 159 U/L — ABNORMAL HIGH (ref 38–126)
Anion gap: 10 (ref 5–15)
BUN: 32 mg/dL — ABNORMAL HIGH (ref 8–23)
CO2: 29 mmol/L (ref 22–32)
Calcium: 8.4 mg/dL — ABNORMAL LOW (ref 8.9–10.3)
Chloride: 104 mmol/L (ref 98–111)
Creatinine, Ser: 0.75 mg/dL (ref 0.61–1.24)
GFR calc Af Amer: >60 mL/min (ref >60)
GFR calc non Af Amer: >60 mL/min (ref >60)
Glucose, Bld: 99 mg/dL (ref 70–99)
Potassium: 4.1 mmol/L (ref 3.5–5.1)
Sodium: 143 mmol/L (ref 135–145)
Total Bilirubin: 1.9 mg/dL — ABNORMAL HIGH (ref 0.3–1.2)
Total Protein: 6.9 g/dL (ref 6.5–8.1)

## 2020-02-23 LAB — CBC
HCT: 29.3 % — ABNORMAL LOW (ref 39.0–52.0)
Hemoglobin: 9.7 g/dL — ABNORMAL LOW (ref 13.0–17.0)
MCH: 30 pg (ref 26.0–34.0)
MCHC: 33.1 g/dL (ref 30.0–36.0)
MCV: 90.7 fL (ref 80.0–100.0)
Platelets: 189 10*3/uL (ref 150–400)
RBC: 3.23 MIL/uL — ABNORMAL LOW (ref 4.22–5.81)
RDW: 19.9 % — ABNORMAL HIGH (ref 11.5–15.5)
WBC: 16.7 10*3/uL — ABNORMAL HIGH (ref 4.0–10.5)
nRBC: 0.1 % (ref 0.0–0.2)

## 2020-02-23 LAB — FERRITIN: Ferritin: 970 ng/mL — ABNORMAL HIGH (ref 24–336)

## 2020-02-23 LAB — FIBRIN DERIVATIVES D-DIMER (ARMC ONLY): Fibrin derivatives D-dimer (ARMC): 5142.48 ng/mL (FEU) — ABNORMAL HIGH (ref 0.00–499.00)

## 2020-02-23 LAB — PROCALCITONIN: Procalcitonin: 0.26 ng/mL

## 2020-02-23 LAB — C-REACTIVE PROTEIN: CRP: 9.4 mg/dL — ABNORMAL HIGH (ref <1.0)

## 2020-02-23 MED ORDER — GLYCOPYRROLATE 0.2 MG/ML IJ SOLN
0.2000 mg | INTRAMUSCULAR | Status: DC | PRN
  Filled 2020-02-23: qty 1

## 2020-02-23 MED ORDER — MORPHINE SULFATE (CONCENTRATE) 10 MG/0.5ML PO SOLN
5.0000 mg | ORAL | Status: DC | PRN
  Filled 2020-02-23: qty 0.5

## 2020-02-23 MED ORDER — LORAZEPAM 2 MG/ML PO CONC: 1.0000 mg | ORAL | Status: DC | PRN

## 2020-02-23 MED ORDER — LORAZEPAM 1 MG PO TABS: 1.0000 mg | ORAL_TABLET | ORAL | Status: DC | PRN

## 2020-02-23 MED ORDER — HALOPERIDOL LACTATE 5 MG/ML IJ SOLN: 0.5000 mg | INTRAMUSCULAR | Status: DC | PRN

## 2020-02-23 MED ORDER — HALOPERIDOL LACTATE 2 MG/ML PO CONC
0.5000 mg | ORAL | Status: DC | PRN
  Filled 2020-02-23: qty 0.3

## 2020-02-23 MED ORDER — MORPHINE SULFATE (PF) 2 MG/ML IV SOLN
1.0000 mg | INTRAVENOUS | Status: DC | PRN
  Administered 2020-02-23 (×2): 1 mg via INTRAVENOUS
  Filled 2020-02-23 (×2): qty 1

## 2020-02-23 MED ORDER — ONDANSETRON HCL 4 MG/2ML IJ SOLN: 4.0000 mg | Freq: Four times a day (QID) | INTRAMUSCULAR | Status: DC | PRN

## 2020-02-23 MED ORDER — LORAZEPAM 2 MG/ML IJ SOLN: 1.0000 mg | INTRAMUSCULAR | Status: DC | PRN

## 2020-02-23 MED ORDER — HALOPERIDOL 0.5 MG PO TABS
0.5000 mg | ORAL_TABLET | ORAL | Status: DC | PRN
  Filled 2020-02-23: qty 1

## 2020-02-23 MED ORDER — GLYCOPYRROLATE 1 MG PO TABS
1.0000 mg | ORAL_TABLET | ORAL | Status: DC | PRN
  Filled 2020-02-23: qty 1

## 2020-02-23 MED ORDER — POLYVINYL ALCOHOL 1.4 % OP SOLN
1.0000 [drp] | Freq: Four times a day (QID) | OPHTHALMIC | Status: DC | PRN
  Filled 2020-02-23: qty 15

## 2020-02-23 MED ORDER — MORPHINE SULFATE (CONCENTRATE) 10 MG/0.5ML PO SOLN: 5.0000 mg | ORAL | Status: DC | PRN

## 2020-02-23 MED ORDER — ONDANSETRON 4 MG PO TBDP
4.0000 mg | ORAL_TABLET | Freq: Four times a day (QID) | ORAL | Status: DC | PRN
  Filled 2020-02-23: qty 1

## 2020-02-23 MED ORDER — ACETAMINOPHEN 325 MG PO TABS: 650.0000 mg | ORAL_TABLET | Freq: Four times a day (QID) | ORAL | Status: DC | PRN

## 2020-02-23 MED ORDER — BIOTENE DRY MOUTH MT LIQD: 15.0000 mL | OROMUCOSAL | Status: DC | PRN

## 2020-02-23 MED ORDER — ACETAMINOPHEN 650 MG RE SUPP: 650.0000 mg | Freq: Four times a day (QID) | RECTAL | Status: DC | PRN

## 2020-02-24 DIAGNOSIS — Z515 Encounter for palliative care: Secondary | ICD-10-CM

## 2020-03-22 NOTE — Progress Notes (Addendum)
PROGRESS NOTE    Nicholas Schultz  EAV:[id number] DOB: 01/11/1946 DOA: 02/16/2020 PCP: Patient, [name] Per   Brief Narrative: Nicholas Schultz is a 74 y.o. malewith medical history significant forcoronary artery disease, heart failure with reduced EF(last known LVEF 30 to 35%),history of ischemic cardiomyopathy, nicotine dependence. Patient presented secondary to hypoxia in setting of COVID-19 infection. He was started on supportive care and treatment for COVID-19 pneumonia.   Assessment & Plan:   Principal Problem:   Acute respiratory failure due to COVID-19 [facility]) Active Problems:   Chronic systolic heart failure (HCC)   Lactic acidosis   Goals of care, counseling/discussion   Palliative care by specialist   Protein-calorie malnutrition, severe   Acute respiratory failure with hypoxia Secondary to Covid-19 pneumonia.  Patient has undergone CTA chest x2 with no evidence of acute PE. He is still requiring significant amount of oxygen and has been placed on NRB and HFNC. -Wean to room air as able however, goal is for management of hypoxia on [place] up to 15 Lpm; needs to come off of NRB -Keep O2 saturation >90%  Lethargy In setting of 1 mg Ativan. Patient with increased respiratory effort this morning as well. Chest x-ray and flumazenil ordered. Persistent today. Patient appears to be declining and will likely progress towards hospital death.  Severe sepsis Secondary to COVID-19 pneumonia.  Patient received a short course of vancomycin and Zosyn which was transitioned to Unasyn.  Patient with 1 out of 4 positive blood cultures for Streptococcus viridans which was a likely contaminant in addition to diphtheroids.  COVID-19 pneumonia Patient with bilateral infiltrates seen on imaging.  Patient treated with a 5-day course of remdesivir which she has completed.  He was started on baricitinib which was discontinued after 4 doses.  He was initially managed on Solu-Medrol which has been transitioned  to prednisone.  LDH, ferritin, CRP trending down. AST/ALT trended down slightly. D-dimer now trending down. -Daily CMP, ferritin, CRP, D-dimer  Chronic systolic heart failure Last left ventricular EF of 30 to 35%.  Lasix initially held for hypotension.  He received intermittent doses of Lasix while inpatient. Wife was inquiring about AICD; not realistic at this point. On chart review, it appears cardiology had recommended EP consultation for consideration of AICD for which the patient declined. Currently stable.  Chronic hypotension -Continue midodrine -Give 500 mL NS bolus to improve ability to get labwork today  Metabolic acidosis Secondary to lactic acidosis. Resolved.  Leukocytosis In setting of infection and steroid use. Stable and elevated -procalcitonin pending; if elevated will consider treatment for bacterial superinfection.  Anemia Appears to be acute since this admission and stable. Normocytic. Recent iron panel suggests anemia of critical illness. Will benefit from iron supplementation depending on disease course and goals of care. Does not appear to have evidence of active bleeding.  Coronary artery atherosclerosis History of MI Seen on CT angio chest. S/p last cardiac catheterization with PCI in 02/2019 in setting of late stent thrombosis. Patient is on aspirin and Brilinta as an outpatient. Patient is also on Lipitor 80 mg as an outpatient. -Continue Brilinta/Aspirin  Severe malnutrition -Continue Ensure  Pink urine Possible hematuria. Urinalysis without gross or microscopic hematuria. Urine appears normal now.  Goals of care Patient appears to be declining towards hospital death. Called to discuss with wife with recommendations to make comfort measures as I do not believe we will be able to improve outcome and comfort measures now will allow family to visit prior to his likely  hospital death. She will discuss with patient's daughters and make a decision. Will continue  current treatment until family makes a decision  Update: decision to make full comfort care.  Cold right hand Possibly arterial compromise. Decreased radial artery pulse. Other three extremities are warm. With patient's current status, will defer possible treatment options.   DVT prophylaxis: Lovenox Code Status:   Code Status: DNR Family Communication: Wife on telephone Disposition Plan: Patient appears to be progressing towards in-hospital death   Consultants:   Palliative care medicine  Procedures:   None  Antimicrobials:  Vancomycin  Zosyn  Unasyn  Remdesivir    Subjective: Lethargic.   Objective: Vitals:   03-23-20 0800 2020/03/23 0900 March 23, 2020 1000 23-Mar-2020 1100  BP: 97/68 95/66 90/68  94/74  Pulse:  (!) 111 (!) 115 (!) 105  Resp: 11 18 15 13   Temp: 99 F (37.2 C) 98.7 F (37.1 C) 98.4 F (36.9 C) 98.2 F (36.8 C)  TempSrc:      SpO2: 100% 100% 98% 100%  Weight:      Height:        Intake/Output Summary (Last 24 hours) at 23-Mar-2020 1143 Last data filed at 02/22/2020 1534 Gross per 24 hour  Intake --  Output 750 ml  Net -750 ml   Filed Weights   02/19/20 0600 02/22/20 0400 2020/03/23 0521  Weight: 65.9 kg 65.1 kg 67.7 kg    Examination:  General exam: Appears calm and comfortable Respiratory system: Clear to auscultation. Respiratory effort normal. Cardiovascular system: S1 & S2 heard, RRR. No murmurs, rubs, gallops or clicks. Cold right hand with significantly diminished radial pulse and no associated cyanosis Gastrointestinal system: Abdomen is nondistended, soft and nontender. No organomegaly or masses felt. Normal bowel sounds heard. Central nervous system: Lethargic. He arouses when stimulated but quickly returns to somnolence. When I hold his hands, he will hold my hand back Musculoskeletal: No edema. No calf tenderness Skin: No cyanosis. No rashes Psychiatry: Judgement and insight appear impaired.    Data Reviewed: I have personally  reviewed following labs and imaging studies  CBC Lab Results  Component Value Date   WBC 16.7 (H) 2020-03-23   RBC 3.23 (L) 03-23-20   HGB 9.7 (L) 03/23/2020   HCT 29.3 (L) March 23, 2020   MCV 90.7 03-23-2020   MCH 30.0 23-Mar-2020   PLT 189 Mar 23, 2020   MCHC 33.1 03-23-2020   RDW 19.9 (H) 03/23/2020   LYMPHSABS 0.8 02/19/2020   MONOABS 0.5 02/19/2020   EOSABS 0.1 02/19/2020   BASOSABS 0.0 07/37/1062     Last metabolic panel Lab Results  Component Value Date   NA 143 Mar 23, 2020   K 4.1 03-23-2020   CL 104 March 23, 2020   CO2 29 03-23-2020   BUN 32 (H) 2020-03-23   CREATININE 0.75 Mar 23, 2020   GLUCOSE 99 March 23, 2020   GFRNONAA >60 2020-03-23   GFRAA >60 03-23-20   CALCIUM 8.4 (L) Mar 23, 2020   PHOS 2.9 02/19/2020   PROT 6.9 Mar 23, 2020   ALBUMIN 2.4 (L) 2020/03/23   BILITOT 1.9 (H) March 23, 2020   ALKPHOS 159 (H) 23-Mar-2020   AST 71 (H) 23-Mar-2020   ALT 73 (H) 2020-03-23   ANIONGAP 10 Mar 23, 2020    CBG (last 3)  Recent Labs    02/20/20 2304  GLUCAP 223*     GFR: Estimated Creatinine Clearance: 76.4 mL/min (by C-G formula based on SCr of 0.75 mg/dL).  Coagulation Profile: No results for input(s): INR, PROTIME in the last 168 hours.  Recent Results (from the past  240 hour(s))  Culture, blood (Routine X 2) w Reflex to ID Panel     Status: None   Collection Time: 02/13/20  1:02 PM   Specimen: BLOOD  Result Value Ref Range Status   Specimen Description BLOOD BLOOD RIGHT HAND  Final   Special Requests   Final    BOTTLES DRAWN AEROBIC AND ANAEROBIC Blood Culture results may not be optimal due to an inadequate volume of blood received in culture bottles   Culture   Final    NO GROWTH 5 DAYS Performed at [facility], [address] [place], [place] [address]    Report Status 02/18/2020 FINAL  Final        Radiology Studies: [facility] Chest Port 1 View  Result Date: 02/22/2020 CLINICAL DATA:  COVID positive EXAM: PORTABLE CHEST 1 VIEW COMPARISON:   February 15, 2020 FINDINGS: The cardiomediastinal silhouette is unchanged in contour. No pleural effusion. No pneumothorax. Diffuse bilateral peripheral predominant heterogeneous opacities, consistent with the sequela of COVID-19 infection, similar in comparison to prior. Visualized abdomen is unremarkable. Multilevel degenerative changes of the thoracic spine. IMPRESSION: Diffuse bilateral peripheral predominant heterogeneous opacities, similar in comparison to prior and consistent with the sequela of COVID-19 infection. Electronically Signed   By: Valentino Saxon MD   On: 02/22/2020 11:07        Scheduled Meds: . albuterol  2 puff Inhalation Q6H  . vitamin C  500 mg Oral Daily  . aspirin EC  81 mg Oral Daily  . Chlorhexidine Gluconate Cloth  6 each Topical Q0600  . enoxaparin (LOVENOX) injection  40 mg Subcutaneous Q24H  . midodrine  2.5 mg Oral TID WC  . multivitamin with minerals  1 tablet Oral Daily  . predniSONE  50 mg Oral Daily  . sodium chloride flush  3 mL Intravenous Q12H  . ticagrelor  90 mg Oral BID  . zinc sulfate  220 mg Oral Daily   Continuous Infusions:   LOS: 14 days     Cordelia Poche, MD [facility] 03/24/2020, 11:43 AM  If 7PM-7AM, please contact night-coverage [email or web address]

## 2020-03-22 NOTE — Progress Notes (Signed)
   02-27-20 0800  Assess: MEWS Score  Temp 99 F (37.2 C)  BP 97/68  ECG Heart Rate (!) 111  Resp 11  SpO2 100 %  Assess: MEWS Score  MEWS Temp 0  MEWS Systolic 1  MEWS Pulse 2  MEWS RR 1  MEWS LOC 0  MEWS Score 4  MEWS Score Color Red  Assess: if the MEWS score is Yellow or Red  Were vital signs taken at a resting state? Yes  Focused Assessment Change from prior assessment (see assessment flowsheet)  Early Detection of Sepsis Score *See Row Information* Low  MEWS guidelines implemented *See Row Information* Yes  Treat  MEWS Interventions Escalated (See documentation below)  Take Vital Signs  Increase Vital Sign Frequency  Red: Q 1hr X 4 then Q 4hr X 4, if remains red, continue Q 4hrs  Escalate  MEWS: Escalate Red: discuss with charge nurse/RN and provider, consider discussing with RRT  Notify: Charge Nurse/RN  Name of Charge Nurse/RN Notified Linard Millers  Date Charge Nurse/RN Notified 2020/02/27  Time Charge Nurse/RN Notified 2774  Notify: Provider  Provider Name/Title MD Lonny Prude  Date Provider Notified 02/27/2020  Time Provider Notified [phone]  Notification Type Face-to-face

## 2020-03-22 NOTE — Progress Notes (Signed)
Patient placed on comfort care. Family at bedside. Morphine PRN per family request. Patient does not appear to be in distress at this time. One dose of morphine given at 1405. Telesitter dc'd. Cardiac monitoring discontinued as well.

## 2020-03-22 NOTE — Death Summary Note (Signed)
DEATH SUMMARY   Patient Details  Name: Nicholas Schultz MRN: [id number] DOB: 1945/12/04  Admission/Discharge Information   Admit Date:  2020-02-17  Date of Death: Date of Death: 03/02/20  Time of Death: Time of Death: Aug 19, 2049  Length of Stay: 08/11/2022  Referring Physician: Patient, [name] Per   Reason(s) for Hospitalization  COVID-19 Pneumonia  Diagnoses  Preliminary cause of death:  Secondary Diagnoses (including complications and co-morbidities):  Principal Problem:   Acute respiratory failure due to COVID-19 [facility]) Active Problems:   Chronic systolic heart failure ([place])   Lactic acidosis   Goals of care, counseling/discussion   Palliative care by specialist   Protein-calorie malnutrition, severe   Comfort measures only status   Brief Hospital Course (including significant findings, care, treatment, and services provided and events leading to death)  Nicholas Schultz is a 74 y.o. year old  malewith medical history significant forcoronary artery disease, heart failure with reduced EF(last known LVEF 30 to 35%),history of ischemic cardiomyopathy, nicotine dependence. Patient presented secondary to hypoxia in setting of COVID-19 infection. He was started on supportive care and treatment for COVID-19 pneumonia. Patient was treated with Remdesivir for a 5 day course, baricitinib for 4 days before discontinuation and Solu-medrol which was transitioned to prednisone. He developed persistent hypoxia without ability to wean and overall deteriorating health. Palliative care discussions with the family led to patient becoming DNR with plans for home with hospice. Patient was unfortunately unable to wean off oxygen and continued to decline medically with recommendations for comfort measures to allow family to visit patient prior to his death.  Final progress note assessment/plan provided:  Acute respiratory failure with hypoxia Secondary to Covid-19 pneumonia.  Patient has undergone CTA chest x2 with no  evidence of acute PE. He is still requiring significant amount of oxygen and has been placed on NRB and HFNC. -Wean to room air as able however, goal is for management of hypoxia on [place] up to 15 Lpm; needs to come off of NRB -Keep O2 saturation >90%  Lethargy In setting of 1 mg Ativan. Patient with increased respiratory effort this morning as well. Chest x-ray and flumazenil ordered. Persistent today. Patient appears to be declining and will likely progress towards hospital death.  Severe sepsis Secondary to COVID-19 pneumonia.  Patient received a short course of vancomycin and Zosyn which was transitioned to Unasyn.  Patient with 1 out of 4 positive blood cultures for Streptococcus viridans which was a likely contaminant in addition to diphtheroids.  COVID-19 pneumonia Patient with bilateral infiltrates seen on imaging.  Patient treated with a 5-day course of remdesivir which she has completed.  He was started on baricitinib which was discontinued after 4 doses.  He was initially managed on Solu-Medrol which has been transitioned to prednisone.  LDH, ferritin, CRP trending down. AST/ALT trended down slightly. D-dimer now trending down. -Daily CMP, ferritin, CRP, D-dimer  Chronic systolic heart failure Last left ventricular EF of 30 to 35%.  Lasix initially held for hypotension.  He received intermittent doses of Lasix while inpatient. Wife was inquiring about AICD; not realistic at this point. On chart review, it appears cardiology had recommended EP consultation for consideration of AICD for which the patient declined. Currently stable.  Chronic hypotension -Continue midodrine -Give 500 mL NS bolus to improve ability to get labwork today  Metabolic acidosis Secondary to lactic acidosis. Resolved.  Leukocytosis In setting of infection and steroid use. Stable and elevated -procalcitonin pending; if elevated will consider treatment for bacterial superinfection.  Anemia Appears to  be acute since this admission and stable. Normocytic. Recent iron panel suggests anemia of critical illness. Will benefit from iron supplementation depending on disease course and goals of care. Does not appear to have evidence of active bleeding.  Coronary artery atherosclerosis History of MI Seen on CT angio chest. S/p last cardiac catheterization with PCI in 02/2019 in setting of late stent thrombosis. Patient is on aspirin and Brilinta as an outpatient. Patient is also on Lipitor 80 mg as an outpatient. -Continue Brilinta/Aspirin  Severe malnutrition -Continue Ensure  Pink urine Possible hematuria. Urinalysis without gross or microscopic hematuria. Urine appears normal now.  Goals of care Patient appears to be declining towards hospital death. Called to discuss with wife with recommendations to make comfort measures as I do not believe we will be able to improve outcome and comfort measures now will allow family to visit prior to his likely hospital death. She will discuss with patient's daughters and make a decision. Will continue current treatment until family makes a decision  Update: decision to make full comfort care.  Cold right hand Possibly arterial compromise. Decreased radial artery pulse. Other three extremities are warm. With patient's current status, will defer possible treatment options.    Pertinent Labs and Studies  Significant Diagnostic Studies DG Chest 1 View  Result Date: 02/15/2020 CLINICAL DATA:  Respiratory failure secondary to COVID-19. EXAM: CHEST  1 VIEW COMPARISON:  Chest radiograph September 24 21 FINDINGS: Monitoring leads overlie the patient. Stable cardiac and mediastinal contours. Mild interval improvement diffuse bilateral airspace opacities. No pleural effusion or pneumothorax. IMPRESSION: Mild interval improvement diffuse bilateral airspace opacities. Electronically Signed   By: Lovey Newcomer M.D.   On: 02/15/2020 14:48   DG Chest 1  View  Result Date: 02/13/2020 CLINICAL DATA:  CHF, COVID positive, coronary disease, smoker EXAM: CHEST  1 VIEW COMPARISON:  02/11/2020 FINDINGS: Worsening diffuse mixed interstitial and airspace opacities throughout both lungs. Stable mild cardiomegaly. No enlarging effusion or pneumothorax. Trachea midline. Aorta atherosclerotic and degenerative changes of the spine. IMPRESSION: Worsening diffuse bilateral pneumonia pattern Electronically Signed   By: Jerilynn Mages.  Shick M.D.   On: 02/13/2020 08:27   CT ANGIO CHEST PE W OR WO CONTRAST  Result Date: 02/12/2020 CLINICAL DATA:  Respiratory failure. Recent exposure to COVID-19 with subsequent positive COVID PCR test 10 days ago. History of heart failure secondary to ischemic cardiomyopathy. EXAM: CT ANGIOGRAPHY CHEST WITH CONTRAST TECHNIQUE: Multidetector CT imaging of the chest was performed using the standard protocol during bolus administration of intravenous contrast. Multiplanar CT image reconstructions and MIPs were obtained to evaluate the vascular anatomy. CONTRAST:  31mL OMNIPAQUE IOHEXOL 350 MG/ML SOLN COMPARISON:  01/30/2020 FINDINGS: Cardiovascular: The main pulmonary artery is patent. No central obstructing pulmonary emboli. There are no lobar or segmental pulmonary artery filling defects identified. Cardiac enlargement. Aortic atherosclerosis. Extensive coronary artery atherosclerotic calcifications. No pericardial effusion. Mediastinum/Nodes: Insert thyroid The trachea appears patent and is midline. Normal appearance of the esophagus. Mediastinal enlarged subcarinal lymph node measures 1.5 cm, image 48/4. Prominent right paratracheal and prevascular lymph nodes identified. Bilateral hilar adenopathy is noted including 1.7 cm right hilar lymph node, image 45/4. Left hilar lymph node measures 1.2 cm, image 46/4. Lungs/Pleura: Centrilobular and paraseptal emphysema identified. There is bilateral lower lung zone predominant interlobular septal thickening.  Diffuse ground-glass opacities are also noted throughout both lungs, most severe within the lower lung zones. No lobar consolidation, atelectasis or pneumothorax. Upper Abdomen: No acute abnormality within the imaged  portions of the upper abdomen. Aortic atherosclerosis noted. Musculoskeletal: Mild spondylosis within the thoracic spine. No acute or suspicious findings. Review of the MIP images confirms the above findings. IMPRESSION: 1. No evidence for acute pulmonary embolus. 2. Diffuse interlobular septal thickening and ground-glass opacification throughout both lungs compatible with COVID-19 pneumonia. 3. Enlarged mediastinal and bilateral hilar lymph nodes. Similar. Favor reactive adenopathy. 4. Extensive coronary artery atherosclerotic calcifications noted. Aortic Atherosclerosis (ICD10-I70.0) and Emphysema (ICD10-J43.9). Electronically Signed   By: Kerby Moors M.D.   On: 02/12/2020 11:10   CT ANGIO CHEST PE W OR WO CONTRAST  Result Date: 01/26/2020 CLINICAL DATA:  COVID-19 positive, hypoxia, short of breath EXAM: CT ANGIOGRAPHY CHEST WITH CONTRAST TECHNIQUE: Multidetector CT imaging of the chest was performed using the standard protocol during bolus administration of intravenous contrast. Multiplanar CT image reconstructions and MIPs were obtained to evaluate the vascular anatomy. CONTRAST:  77mL OMNIPAQUE IOHEXOL 350 MG/ML SOLN COMPARISON:  02/08/2020 FINDINGS: Cardiovascular: This is a technically adequate evaluation of the pulmonary vasculature. There are no filling defects or pulmonary emboli. There is marked left ventricular dilatation. No pericardial effusion. Normal caliber of the thoracic aorta. Moderate atherosclerosis of the aorta, with extensive atherosclerosis of the coronary vasculature. Mediastinum/Nodes: Mediastinal and hilar adenopathy likely reactive. Largest lymph node in the right paratracheal region measures 12 mm in short axis. Thyroid, trachea, and esophagus are unremarkable.  Lungs/Pleura: There is bilateral multifocal ground-glass airspace disease superimposed upon background emphysema and mild bronchiectasis. No effusion or pneumothorax. Central airways are patent. Upper Abdomen: No acute abnormality. Musculoskeletal: No acute or destructive bony lesions. Reconstructed images demonstrate no additional findings. Review of the MIP images confirms the above findings. IMPRESSION: 1. No evidence of pulmonary embolus. 2. Bilateral multifocal ground-glass airspace disease consistent with COVID-19 pneumonia. 3. Marked left ventricular dilatation. 4. Mediastinal and hilar adenopathy, likely reactive. 5. Aortic Atherosclerosis (ICD10-I70.0) and Emphysema (ICD10-J43.9). Electronically Signed   By: Randa Ngo M.D.   On: 01/31/2020 17:59   DG Chest Port 1 View  Result Date: 02/22/2020 CLINICAL DATA:  COVID positive EXAM: PORTABLE CHEST 1 VIEW COMPARISON:  February 15, 2020 FINDINGS: The cardiomediastinal silhouette is unchanged in contour. No pleural effusion. No pneumothorax. Diffuse bilateral peripheral predominant heterogeneous opacities, consistent with the sequela of COVID-19 infection, similar in comparison to prior. Visualized abdomen is unremarkable. Multilevel degenerative changes of the thoracic spine. IMPRESSION: Diffuse bilateral peripheral predominant heterogeneous opacities, similar in comparison to prior and consistent with the sequela of COVID-19 infection. Electronically Signed   By: Valentino Saxon MD   On: 02/22/2020 11:07   DG Chest Port 1 View  Result Date: 02/11/2020 CLINICAL DATA:  Respiratory failure.  COVID-19 infection. EXAM: PORTABLE CHEST 1 VIEW COMPARISON:  CT chest and chest x-ray dated February 09, 2020. FINDINGS: Stable cardiomediastinal silhouette. Patchy bilateral interstitial and hazy airspace opacities have mildly progressed since the prior study. No pleural effusion or pneumothorax. No acute osseous abnormality. IMPRESSION: 1. Mild progression  of multifocal pneumonia. Electronically Signed   By: Titus Dubin M.D.   On: 02/11/2020 16:27   DG Chest Port 1 View  Result Date: 01/23/2020 CLINICAL DATA:  Shortness of breath with hypoxemia.  No fever. EXAM: PORTABLE CHEST 1 VIEW COMPARISON:  Radiographs 03/20/2019 and 07/17/2018. FINDINGS: 1355 hours. Two views were obtained. The heart size and mediastinal contours are stable. Diffuse interstitial opacities are again noted in both lungs with an asymmetric component in the right upper lobe. Compared with the prior study, the current findings are less  severe. There is no confluent airspace opacity, significant pleural effusion or pneumothorax. The bones appear intact. IMPRESSION: Overall improved aeration of both lungs with persistent/recurrent diffuse interstitial opacities, most pronounced in the right upper lobe. Findings are most consistent with atypical edema or atypical infection. There could be a component of underlying fibrosis, and follow-up until clearing recommended. Electronically Signed   By: Richardean Sale M.D.   On: 01/23/2020 14:15   US Abdomen Limited RUQ  Result Date: 02/14/2020 CLINICAL DATA:  Abnormal liver function tests. EXAM: ULTRASOUND ABDOMEN LIMITED RIGHT UPPER QUADRANT COMPARISON:  None. FINDINGS: Gallbladder: Large amount of gallbladder sludge. There is abnormal thickening of the gallbladder wall measuring 3.6 mm. The sonographic Murphy's sign however is recorded as negative. Common bile duct: Diameter: 3.8 mm, normal Liver: Diffusely increased parenchymal echogenicity. Portal vein is patent on color Doppler imaging with normal direction of blood flow towards the liver. Other: None. IMPRESSION: 1. Large amount of gallbladder sludge with abnormal thickening of the gallbladder wall measuring 3.6 mm. 2. The sonographic Murphy's sign however is recorded as negative. Findings are inconclusive for acute cholecystitis. Confirmation with nuclear medicine HIDA scan may be considered.  3. Diffusely increased parenchymal echogenicity of the liver, usually associated with hepatic steatosis or fibrosis. Electronically Signed   By: Fidela Salisbury M.D.   On: 02/14/2020 15:58    Microbiology No results found for this or any previous visit (from the past 240 hour(s)).  Lab Basic Metabolic Panel: Recent Labs  Lab 02/18/20 0411 02/18/20 0411 02/19/20 0753 02/20/20 1025 02/21/20 1332 02/22/20 0627 03/16/2020 0706  NA 136   < > 139 137 137 140 143  K 3.9   < > 4.8 3.9 4.3 4.2 4.1  CL 99   < > 100 100 99 103 104  CO2 30   < > 32 31 31 30 29   GLUCOSE 121*   < > 95 102* 149* 116* 99  BUN 35*   < > 37* 33* 27* 30* 32*  CREATININE 0.72   < > 0.86 0.74 0.81 0.71 0.75  CALCIUM 8.1*   < > 8.7* 7.9* 8.5* 8.3* 8.4*  MG 2.3  --  2.3  --   --   --   --   PHOS 3.0  --  2.9  --   --   --   --    < > = values in this interval not displayed.   Liver Function Tests: Recent Labs  Lab 02/19/20 0753 02/20/20 0643 02/21/20 1332 02/22/20 0627 2020/03/16 0706  AST 85* 72* 74* 69* 71*  ALT 78* 71* 82* 70* 73*  ALKPHOS 233* 178* 192* 162* 159*  BILITOT 2.0* 1.4* 1.7* 1.7* 1.9*  PROT 6.6 5.4* 6.3* 6.0* 6.9  ALBUMIN 2.4* 2.0* 2.2* 2.1* 2.4*   No results for input(s): LIPASE, AMYLASE in the last 168 hours. No results for input(s): AMMONIA in the last 168 hours. CBC: Recent Labs  Lab 02/18/20 0411 02/19/20 0753 02/21/20 1332 02/22/20 0627 03-16-2020 0706  WBC 14.2* 20.4*  20.4* 19.3* 19.6* 16.7*  NEUTROABS  --  18.6*  --   --   --   HGB 9.3* 10.4*  10.4* 9.5* 8.5* 9.7*  HCT 26.5* 30.7*  30.2* 27.7* 25.8* 29.3*  MCV 83.6 85.5  84.6 86.6 89.6 90.7  PLT 197 243  249 224 194 189   Cardiac Enzymes: No results for input(s): CKTOTAL, CKMB, CKMBINDEX, TROPONINI in the last 168 hours. Sepsis Labs: Recent Labs  Lab 02/19/20 0753 02/21/20  1332 02/22/20 0627 12-Mar-2020 0706  PROCALCITON  --  0.10 0.20 0.26  WBC 20.4*  20.4* 19.3* 19.6* 16.7*    Procedures/Operations      Cordelia Poche 02/24/2020, 9:20 PM

## 2020-03-22 DEATH — deceased

## 2020-04-21 ENCOUNTER — Ambulatory Visit: Payer: Medicare HMO | Admitting: Internal Medicine

## 2021-05-04 IMAGING — DX DG CHEST 1V PORT
1 series · 1 of 1 positions shown · non-contrast
Comparison: February 15, 2020

CLINICAL DATA: COVID positive

EXAM:
PORTABLE CHEST 1 VIEW

[chest ap]
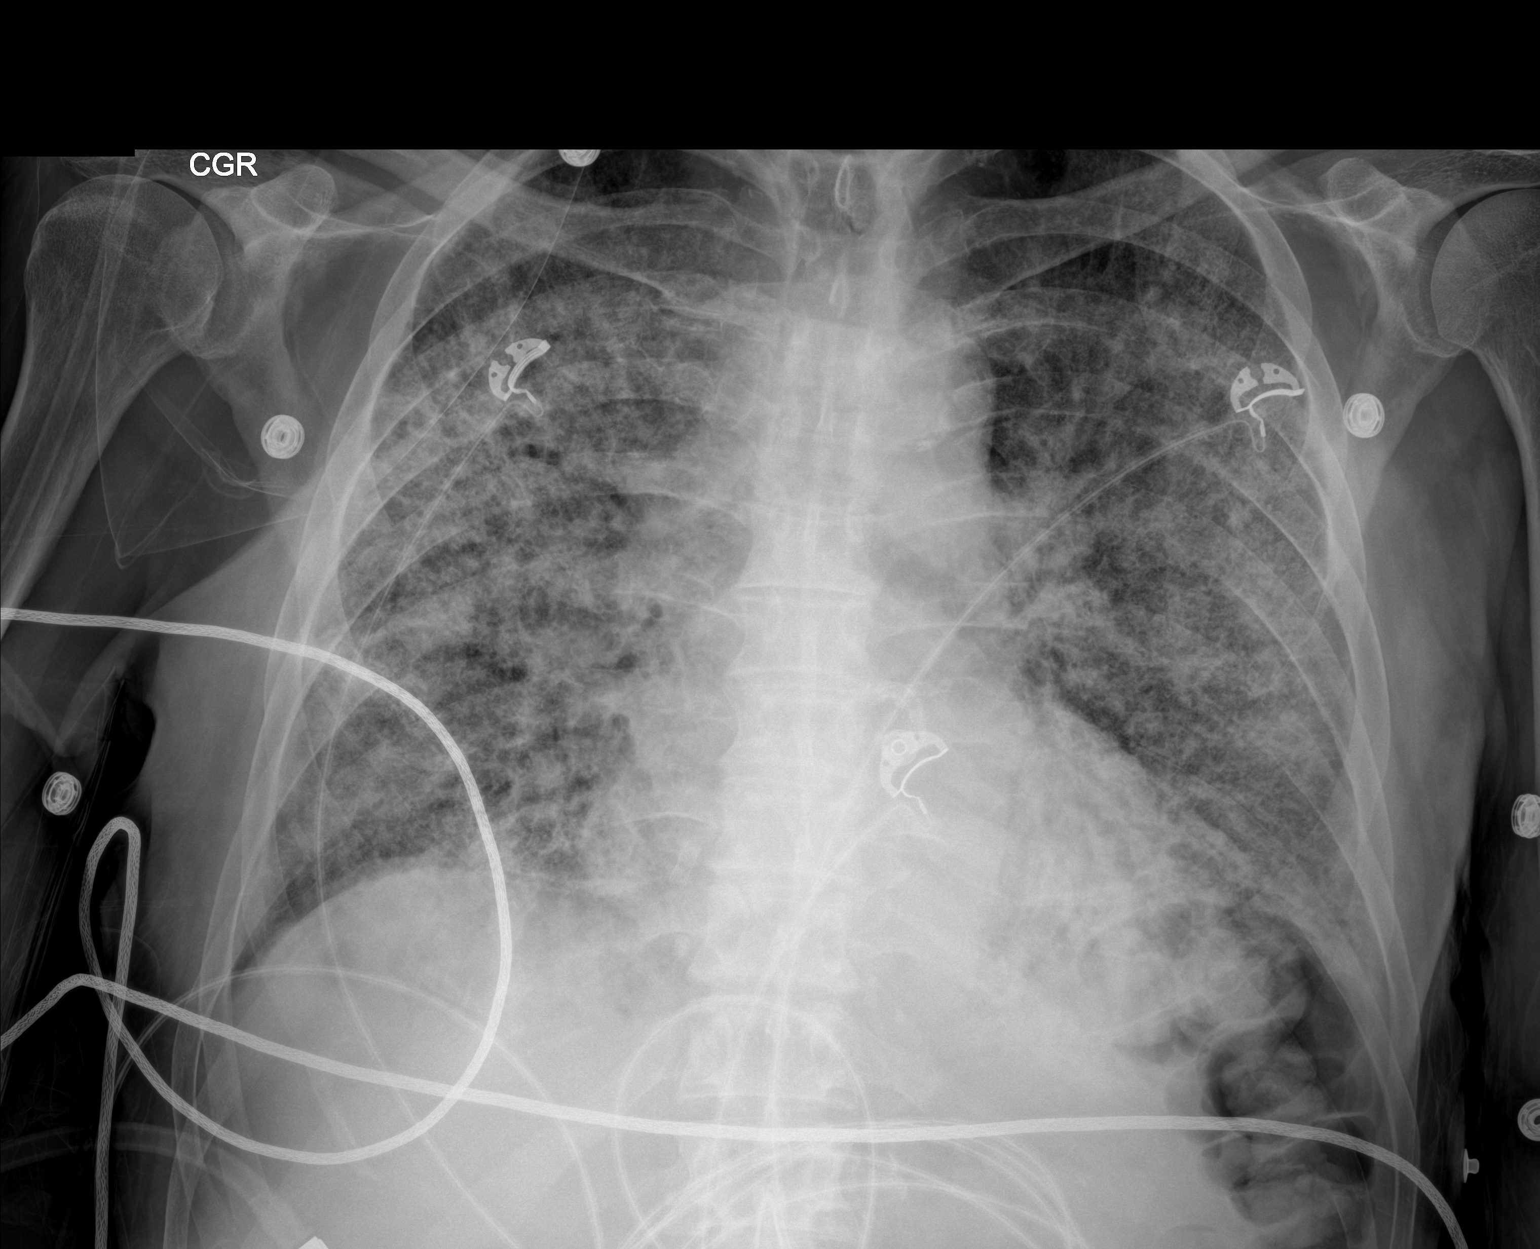

[1 of 1 positions shown; findings below may reference images not displayed]

FINDINGS: The cardiomediastinal silhouette is unchanged in contour. No pleural
effusion. No pneumothorax. Diffuse bilateral peripheral predominant
heterogeneous opacities, consistent with the sequela of ADAIN-HF
infection, similar in comparison to prior. Visualized abdomen is
unremarkable. Multilevel degenerative changes of the thoracic spine.
IMPRESSION: Diffuse bilateral peripheral predominant heterogeneous opacities,
similar in comparison to prior and consistent with the sequela of
ADAIN-HF infection.
